# Patient Record
Sex: Female | Born: 1950 | Race: White | Marital: Single | Smoking: Current every day smoker
Health system: Southern US, Community
[De-identification: ages and names within clinical notes are randomized; demographics above are authoritative.]

## PROBLEM LIST (undated history)

## (undated) DIAGNOSIS — J449 Chronic obstructive pulmonary disease, unspecified: Secondary | ICD-10-CM

## (undated) DIAGNOSIS — K648 Other hemorrhoids: Secondary | ICD-10-CM

## (undated) DIAGNOSIS — E079 Disorder of thyroid, unspecified: Secondary | ICD-10-CM

## (undated) DIAGNOSIS — K644 Residual hemorrhoidal skin tags: Secondary | ICD-10-CM

## (undated) DIAGNOSIS — L439 Lichen planus, unspecified: Secondary | ICD-10-CM

## (undated) DIAGNOSIS — R0989 Other specified symptoms and signs involving the circulatory and respiratory systems: Secondary | ICD-10-CM

## (undated) DIAGNOSIS — E039 Hypothyroidism, unspecified: Secondary | ICD-10-CM

## (undated) DIAGNOSIS — E785 Hyperlipidemia, unspecified: Secondary | ICD-10-CM

## (undated) DIAGNOSIS — K449 Diaphragmatic hernia without obstruction or gangrene: Secondary | ICD-10-CM

## (undated) DIAGNOSIS — J439 Emphysema, unspecified: Secondary | ICD-10-CM

## (undated) DIAGNOSIS — H35349 Macular cyst, hole, or pseudohole, unspecified eye: Secondary | ICD-10-CM

## (undated) DIAGNOSIS — T7840XA Allergy, unspecified, initial encounter: Secondary | ICD-10-CM

## (undated) DIAGNOSIS — K219 Gastro-esophageal reflux disease without esophagitis: Secondary | ICD-10-CM

## (undated) DIAGNOSIS — E559 Vitamin D deficiency, unspecified: Secondary | ICD-10-CM

## (undated) DIAGNOSIS — N159 Renal tubulo-interstitial disease, unspecified: Secondary | ICD-10-CM

## (undated) DIAGNOSIS — M1990 Unspecified osteoarthritis, unspecified site: Secondary | ICD-10-CM

## (undated) DIAGNOSIS — K589 Irritable bowel syndrome without diarrhea: Secondary | ICD-10-CM

## (undated) DIAGNOSIS — C159 Malignant neoplasm of esophagus, unspecified: Secondary | ICD-10-CM

## (undated) DIAGNOSIS — N200 Calculus of kidney: Secondary | ICD-10-CM

## (undated) DIAGNOSIS — Z923 Personal history of irradiation: Secondary | ICD-10-CM

## (undated) DIAGNOSIS — K5790 Diverticulosis of intestine, part unspecified, without perforation or abscess without bleeding: Secondary | ICD-10-CM

## (undated) HISTORY — DX: Other hemorrhoids: K64.8

## (undated) HISTORY — DX: Hyperlipidemia, unspecified: E78.5

## (undated) HISTORY — DX: Residual hemorrhoidal skin tags: K64.4

## (undated) HISTORY — DX: Gastro-esophageal reflux disease without esophagitis: K21.9

## (undated) HISTORY — DX: Lichen planus, unspecified: L43.9

## (undated) HISTORY — DX: Chronic obstructive pulmonary disease, unspecified: J44.9

## (undated) HISTORY — DX: Malignant neoplasm of esophagus, unspecified: C15.9

## (undated) HISTORY — DX: Diaphragmatic hernia without obstruction or gangrene: K44.9

## (undated) HISTORY — DX: Disorder of thyroid, unspecified: E07.9

## (undated) HISTORY — DX: Calculus of kidney: N20.0

## (undated) HISTORY — DX: Other specified symptoms and signs involving the circulatory and respiratory systems: R09.89

## (undated) HISTORY — DX: Hypothyroidism, unspecified: E03.9

## (undated) HISTORY — DX: Unspecified osteoarthritis, unspecified site: M19.90

## (undated) HISTORY — DX: Diverticulosis of intestine, part unspecified, without perforation or abscess without bleeding: K57.90

## (undated) HISTORY — DX: Allergy, unspecified, initial encounter: T78.40XA

## (undated) HISTORY — DX: Renal tubulo-interstitial disease, unspecified: N15.9

## (undated) HISTORY — DX: Emphysema, unspecified: J43.9

## (undated) HISTORY — DX: Irritable bowel syndrome, unspecified: K58.9

## (undated) HISTORY — DX: Macular cyst, hole, or pseudohole, unspecified eye: H35.349

## (undated) HISTORY — PX: COLONOSCOPY: SHX174

## (undated) HISTORY — DX: Vitamin D deficiency, unspecified: E55.9

---

## 1980-02-05 HISTORY — PX: DILATION AND CURETTAGE OF UTERUS: SHX78

## 2015-02-05 HISTORY — PX: COLONOSCOPY: SHX174

## 2015-06-29 ENCOUNTER — Telehealth: Payer: Self-pay | Admitting: Internal Medicine

## 2015-06-29 NOTE — Telephone Encounter (Signed)
Records approved by Dr. Hilarie Fredrickson - ok to schedule colonoscopy.

## 2015-06-30 NOTE — Telephone Encounter (Signed)
Patient is requesting August. We will call her when August schedule is available.

## 2015-07-07 ENCOUNTER — Encounter: Payer: Self-pay | Admitting: Internal Medicine

## 2015-08-11 ENCOUNTER — Other Ambulatory Visit: Payer: Self-pay | Admitting: Internal Medicine

## 2015-08-11 DIAGNOSIS — M5412 Radiculopathy, cervical region: Secondary | ICD-10-CM

## 2015-08-11 DIAGNOSIS — M5416 Radiculopathy, lumbar region: Secondary | ICD-10-CM

## 2015-08-19 ENCOUNTER — Other Ambulatory Visit: Payer: Self-pay

## 2015-08-23 ENCOUNTER — Ambulatory Visit
Admission: RE | Admit: 2015-08-23 | Discharge: 2015-08-23 | Disposition: A | Discharge status: Home / Self Care | Payer: Medicare Other | Source: Ambulatory Visit | Attending: Internal Medicine | Admitting: Internal Medicine

## 2015-08-23 DIAGNOSIS — M5412 Radiculopathy, cervical region: Secondary | ICD-10-CM

## 2015-08-23 DIAGNOSIS — M5416 Radiculopathy, lumbar region: Secondary | ICD-10-CM

## 2015-09-07 ENCOUNTER — Encounter (INDEPENDENT_AMBULATORY_CARE_PROVIDER_SITE_OTHER): Payer: Self-pay

## 2015-09-07 ENCOUNTER — Ambulatory Visit (AMBULATORY_SURGERY_CENTER): Payer: Self-pay | Admitting: *Deleted

## 2015-09-07 VITALS — Ht 66.0 in | Wt 146.0 lb

## 2015-09-07 DIAGNOSIS — R194 Change in bowel habit: Secondary | ICD-10-CM

## 2015-09-07 MED ORDER — NA SULFATE-K SULFATE-MG SULF 17.5-3.13-1.6 GM/177ML PO SOLN: 1.0000 | Freq: Once | ORAL | 0 refills | Status: AC

## 2015-09-07 NOTE — Progress Notes (Signed)
No allergies to eggs or soy. No problems with anesthesia.  Pt given Emmi instructions for colonoscopy  No oxygen use  No diet drug use  

## 2015-09-21 ENCOUNTER — Encounter: Payer: Self-pay | Admitting: Internal Medicine

## 2015-09-21 ENCOUNTER — Ambulatory Visit (AMBULATORY_SURGERY_CENTER): Payer: Medicare Other | Admitting: Internal Medicine

## 2015-09-21 VITALS — BP 152/74 | HR 70 | Temp 98.2°F | Resp 23 | Ht 66.0 in | Wt 146.0 lb

## 2015-09-21 DIAGNOSIS — Z8719 Personal history of other diseases of the digestive system: Secondary | ICD-10-CM

## 2015-09-21 DIAGNOSIS — R1030 Lower abdominal pain, unspecified: Secondary | ICD-10-CM | POA: Diagnosis not present

## 2015-09-21 DIAGNOSIS — R194 Change in bowel habit: Secondary | ICD-10-CM | POA: Diagnosis not present

## 2015-09-21 MED ORDER — SODIUM CHLORIDE 0.9 % IV SOLN: 500.0000 mL | INTRAVENOUS | Status: DC

## 2015-09-21 NOTE — Progress Notes (Signed)
Report to PACU, RN, vss, BBS= Clear.  

## 2015-09-21 NOTE — Patient Instructions (Signed)
YOU HAD AN ENDOSCOPIC PROCEDURE TODAY AT [facility]:   Refer to the procedure report that was given to you for any specific questions about what was found during the examination.  If the procedure report does not answer your questions, please call your gastroenterologist to clarify.  If you requested that your care partner not be given the details of your procedure findings, then the procedure report has been included in a sealed envelope for you to review at your convenience later.  YOU SHOULD EXPECT: Some feelings of bloating in the abdomen. Passage of more gas than usual.  Walking can help get rid of the air that was put into your GI tract during the procedure and reduce the bloating. If you had a lower endoscopy (such as a colonoscopy or flexible sigmoidoscopy) you may notice spotting of blood in your stool or on the toilet paper. If you underwent a bowel prep for your procedure, you may not have a normal bowel movement for a few days.  Please Note:  You might notice some irritation and congestion in your nose or some drainage.  This is from the oxygen used during your procedure.  There is no need for concern and it should clear up in a day or so.  SYMPTOMS TO REPORT IMMEDIATELY:   Following lower endoscopy (colonoscopy or flexible sigmoidoscopy):  Excessive amounts of blood in the stool  Significant tenderness or worsening of abdominal pains  Swelling of the abdomen that is new, acute  Fever of 100F or higher  For urgent or emergent issues, a gastroenterologist can be reached at any hour by calling [phone].   DIET:  We do recommend a small meal at first, but then you may proceed to your regular diet.  Drink plenty of fluids but you should avoid alcoholic beverages for 24 hours.  ACTIVITY:  You should plan to take it easy for the rest of today and you should NOT DRIVE or use heavy machinery until tomorrow (because of the sedation medicines used during the test).     FOLLOW UP: Our staff will call the number listed on your records the next business day following your procedure to check on you and address any questions or concerns that you may have regarding the information given to you following your procedure. If we do not reach you, we will leave a message.  However, if you are feeling well and you are not experiencing any problems, there is no need to return our call.  We will assume that you have returned to your regular daily activities without incident.  If any biopsies were taken you will be contacted by phone or by letter within the next 1-3 weeks.  Please call us at [phone] if you have not heard about the biopsies in 3 weeks.    SIGNATURES/CONFIDENTIALITY: You and/or your care partner have signed paperwork which will be entered into your electronic medical record.  These signatures attest to the fact that that the information above on your After Visit Summary has been reviewed and is understood.  Full responsibility of the confidentiality of this discharge information lies with you and/or your care-partner.  Thank you for letting us take care of your healthcare needs.

## 2015-09-21 NOTE — Op Note (Signed)
[facility] Patient Name: Alison Garner Procedure Date: 09/21/2015 11:10 AM MRN: [id number] Endoscopist: Jerene Bears , MD Age: 65 Referring MD:  Date of Birth: Mar 07, 1950 Gender: Female Account #: [id number] Procedure:                Colonoscopy Indications:              Irritable bowel syndrome, Lower abdominal pain,                            Change in bowel habits, tenesmus Medicines:                Monitored Anesthesia Care Procedure:                Pre-Anesthesia Assessment:                           - Prior to the procedure, a History and Physical                            was performed, and patient medications and                            allergies were reviewed. The patient's tolerance of                            previous anesthesia was also reviewed. The risks                            and benefits of the procedure and the sedation                            options and risks were discussed with the patient.                            All questions were answered, and informed consent                            was obtained. Prior Anticoagulants: The patient has                            taken no previous anticoagulant or antiplatelet                            agents. ASA Grade Assessment: II - A patient with                            mild systemic disease. After reviewing the risks                            and benefits, the patient was deemed in                            satisfactory condition to undergo the procedure.  After obtaining informed consent, the colonoscope                            was passed under direct vision. Throughout the                            procedure, the patient's blood pressure, pulse, and                            oxygen saturations were monitored continuously. The                            Model PCF-H190L [id number]) scope was introduced                            through the anus and advanced to  the the cecum,                            identified by appendiceal orifice and ileocecal                            valve. The colonoscopy was performed without                            difficulty. The patient tolerated the procedure                            well. The quality of the bowel preparation was                            good. The ileocecal valve, appendiceal orifice, and                            rectum were photographed. Scope In: 11:28:47 AM Scope Out: [id number] AM Scope Withdrawal Time: 0 hours 12 minutes 14 seconds  Total Procedure Duration: 0 hours 17 minutes 24 seconds  Findings:                 The digital rectal exam findings include                            non-thrombosed external hemorrhoids and thrombosed                            internal hemorrhoids.                           A few small-mouthed diverticula were found in the                            sigmoid colon.                           The exam was otherwise normal throughout the  examined colon.                           External and internal hemorrhoids were found during                            retroflexion and during digital exam. The                            hemorrhoids were large. Complications:            No immediate complications. Estimated Blood Loss:     Estimated blood loss: none. Impression:               - Diverticulosis in the sigmoid colon.                           - Large external and internal hemorrhoids.                           - No polyps or evidence of colitis.                           - No specimens collected. Recommendation:           - Patient has a contact number available for                            emergencies. The signs and symptoms of potential                            delayed complications were discussed with the                            patient. Return to normal activities tomorrow.                            Written discharge  instructions were provided to the                            patient.                           - Resume previous diet.                           - Continue present medications.                           - Repeat colonoscopy in 10 years for screening                            purposes.                           - Refer to a surgeon at appointment to be scheduled  for consideration of hemorrhoidectomy. Jerene Bears, MD 09/21/2015 11:52:10 AM This report has been signed electronically.

## 2015-09-22 ENCOUNTER — Telehealth: Payer: Self-pay

## 2015-09-22 NOTE — Telephone Encounter (Signed)
  Follow up Call-  Call back number 09/21/2015  Post procedure Call Back phone  # [phone]  Permission to leave phone message Yes     Patient questions:  Do you have a fever, pain , or abdominal swelling? No. Pain Score  0 *  Have you tolerated food without any problems? Yes.    Have you been able to return to your normal activities? Yes.    Do you have any questions about your discharge instructions: Diet   No. Medications  No. Follow up visit  No.  Do you have questions or concerns about your Care? No.  Actions:

## 2015-09-22 NOTE — Telephone Encounter (Signed)
  Follow up Call-  Call back number 09/21/2015  Post procedure Call Back phone  # [phone]  Permission to leave phone message Yes     Patient questions:  Do you have a fever, pain , or abdominal swelling? No. Pain Score  0 *  Have you tolerated food without any problems? Yes.    Have you been able to return to your normal activities? Yes.    Do you have any questions about your discharge instructions: Diet   No. Medications  No. Follow up visit  No.  Do you have questions or concerns about your Care? No.   Actions: * If pain score is 4 or above: No action needed, pain <4.

## 2017-08-28 ENCOUNTER — Encounter (INDEPENDENT_AMBULATORY_CARE_PROVIDER_SITE_OTHER): Payer: Self-pay | Admitting: Orthopaedic Surgery

## 2017-08-28 ENCOUNTER — Ambulatory Visit (INDEPENDENT_AMBULATORY_CARE_PROVIDER_SITE_OTHER): Payer: Medicare Other

## 2017-08-28 ENCOUNTER — Ambulatory Visit (INDEPENDENT_AMBULATORY_CARE_PROVIDER_SITE_OTHER): Payer: Medicare Other | Admitting: Orthopaedic Surgery

## 2017-08-28 VITALS — BP 119/64 | HR 91 | Ht 65.0 in | Wt 144.0 lb

## 2017-08-28 DIAGNOSIS — M25521 Pain in right elbow: Secondary | ICD-10-CM

## 2017-08-28 NOTE — Progress Notes (Signed)
Office Visit Note   Patient: Alison Garner           Date of Birth: 07-Oct-1950           MRN: [id number] Visit Date: 08/28/2017              Requested by: [name], [address] [address] PCP: [name], MD   Assessment & Plan: Visit Diagnoses:  1. Pain in right elbow     Plan: Patient's exam is consistent with distal biceps tendinopathy.  Discussed activity modification to avoid resistive biceps activities for period of time.  2 months.  We discussed if she has persistent symptoms we can obtain an MRI scan.  She should get resolution of her symptoms with activity modification and rest. Follow-Up Instructions: No follow-ups on file.   Orders:  Orders Placed This Encounter  Procedures  . XR Elbow 2 Views Right   No orders of the defined types were placed in this encounter.     Procedures: No procedures performed   Clinical Data: No additional findings.   Subjective: Chief Complaint  Patient presents with  . Right Elbow - Pain    HPI 67 year old female here for evaluation of right elbow for 1 month.  She had done a lot of gardening your work pulling weeds is right-hand dominant started having right antecubital pain was reproduced with resisted elbow flexion.  Is holding someone's child last month and had significant increase in pain in the area.  The biceps tendon in the antecubital region.  Past history of injury..  Review of Systems previous surgeries include colonoscopy to.  Reflux bronchitis cataracts tiny goiter and IBS   Objective: Vital Signs: BP 119/64   Pulse 91   Ht 5\' 5"  (1.651 m)   Wt 144 lb (65.3 kg)   BMI 23.96 kg/m   Physical Exam  Constitutional: She is oriented to person, place, and time. She appears well-developed.  HENT:  Head: Normocephalic.  Right Ear: External ear normal.  Left Ear: External ear normal.  Eyes: Pupils are equal, round, and reactive to light.  Neck: No tracheal deviation present.  No thyromegaly present.  Cardiovascular: Normal rate.  Pulmonary/Chest: Effort normal.  Abdominal: Soft.  Neurological: She is alert and oriented to person, place, and time.  Skin: Skin is warm and dry.  Psychiatric: She has a normal mood and affect. Her behavior is normal.    Ortho Exam no pain with resisted forearm pronation supination.  Tenderness of the distal biceps tendon some mild swelling around the biceps tendon.  Pain with resisted biceps function directly over the bicipital tuberosity and distal biceps insertion site.  Medial lateral epicondyles are normal radial heads normal no elbow effusion median nerve in the forearm the wrist is normal no intrinsic atrophy.  Specialty Comments:  No specialty comments available.  Imaging: No results found.   PMFS History: There are no active problems to display for this patient.  Past Medical History:  Diagnosis Date  . Arthritis   . Hyperlipidemia   . Thyroid disease    thyroid nodule    Family History  Problem Relation Age of Onset  . Colon cancer Neg Hx   . Esophageal cancer Neg Hx   . Stomach cancer Neg Hx   . Rectal cancer Neg Hx     Past Surgical History:  Procedure Laterality Date  . COLONOSCOPY    . [place]   Social History  Occupational History  . Not on file  Tobacco Use  . Smoking status: Current Every Day Smoker    Packs/day: 1.00    Years: 50.00    Pack years: 50.00    Types: Cigarettes  . Smokeless tobacco: Never Used  Substance and Sexual Activity  . Alcohol use: Yes    Comment: rare  . Drug use: No  . Sexual activity: Not on file

## 2017-11-06 ENCOUNTER — Ambulatory Visit (INDEPENDENT_AMBULATORY_CARE_PROVIDER_SITE_OTHER): Payer: Medicare Other | Admitting: Orthopaedic Surgery

## 2020-09-15 ENCOUNTER — Encounter: Payer: Self-pay | Admitting: *Deleted

## 2020-11-13 ENCOUNTER — Ambulatory Visit (INDEPENDENT_AMBULATORY_CARE_PROVIDER_SITE_OTHER): Payer: Medicare Other | Admitting: Internal Medicine

## 2020-11-13 ENCOUNTER — Encounter: Payer: Self-pay | Admitting: Internal Medicine

## 2020-11-13 VITALS — BP 130/70 | HR 82 | Ht 65.0 in | Wt 135.4 lb

## 2020-11-13 DIAGNOSIS — R1010 Upper abdominal pain, unspecified: Secondary | ICD-10-CM | POA: Diagnosis not present

## 2020-11-13 DIAGNOSIS — K449 Diaphragmatic hernia without obstruction or gangrene: Secondary | ICD-10-CM | POA: Diagnosis not present

## 2020-11-13 DIAGNOSIS — R1310 Dysphagia, unspecified: Secondary | ICD-10-CM | POA: Diagnosis not present

## 2020-11-13 NOTE — Progress Notes (Signed)
Patient ID: Alison Garner, female   DOB: [birth year], 70 y.o.   MRN: [id number] HPI: Alison Garner is a 70 year old female with PMH of colonic diverticulosis, hemorrhoids, hyperlipidemia, hypothyroidism, kidney stones who is seen in consult at the request of Dr. Jenean Lindau to evaluate dysphagia and upper abdominal pain.  She is here alone today.  She is known to me from colonoscopy performed on 09/21/2015 for IBS type symptoms with lower abdominal pain, change in bowel habits and tenesmus.  This was normal with the exception of a few sigmoid diverticula and hemorrhoids.  She reports that around May or June she developed dysphagia symptom.  She reports food will stick in the mid chest and it is painful.  This is worse with cold drinks and liquids.  She also avoids tomatoes or if she eats spicy or acidic food she does so with milk.  Room temperature and hot liquids are easier.  Some solid food dysphagia with dry food like biscuit.  Occurs 3-4 times per week though is intermittent.  When it happens either she can regurgitate the food back up or she will stand up and the food will transit into the stomach.  She is not having true heartburn.  At primary care recommendation she tried famotidine but only for about a week with no change in symptoms.  Separate from this she has nocturnal episodes of upper abdominal pain under her breast which feels like a belt is being squeezed.  Does not happen during the day or with exertion.  Has only occurred at night waking her from sleep.  Lasts 5 to 8 minutes and resolves.  She is able to swallow without incident during these events.  Past Medical History:  Diagnosis Date   Arthritis    Carotid bruit    Diverticulosis    External hemorrhoids    Hiatal hernia    Hyperlipidemia    Hypothyroidism    Internal hemorrhoids    Irritable bowel syndrome    Kidney stones    Lichen planus    Macular cyst, hole, or pseudohole, unspecified eye    Thyroid disease    thyroid  nodule   Vitamin D deficiency     Past Surgical History:  Procedure Laterality Date   COLONOSCOPY     DILATION AND CURETTAGE OF UTERUS  1982    Outpatient Medications Prior to Visit  Medication Sig Dispense Refill   b complex vitamins tablet Take 1 tablet by mouth 2 (two) times daily.     Cholecalciferol 50 MCG (2000 UT) CAPS Take 4,000 Units by mouth daily.     ezetimibe-simvastatin (VYTORIN) 10-20 MG tablet Take 1 tablet by mouth every other day.     imipramine (TOFRANIL) 25 MG tablet Take 25 mg by mouth daily. Takes 2 tablets at bedtime     levothyroxine (SYNTHROID, LEVOTHROID) 25 MCG tablet      Levothyroxine Sodium 25 MCG CAPS Take by mouth daily before breakfast.     Multiple Vitamins-Minerals (PRESERVISION/LUTEIN PO) Take by mouth daily.     clobetasol ointment (TEMOVATE) 8.14 % Apply 1 application topically 2 (two) times daily.     clotrimazole-betamethasone (LOTRISONE) cream Apply 1 application topically 2 (two) times daily.     hydrocortisone 1 % ointment Apply 1 application topically 2 (two) times daily.     triamcinolone lotion (KENALOG) 0.1 % Apply 1 application topically 3 (three) times daily.     Facility-Administered Medications Prior to Visit  Medication Dose Route Frequency Provider Last  Rate Last Admin   0.9 %  sodium chloride infusion  500 mL Intravenous Continuous Francelia Mclaren, Lajuan Lines, MD        Allergies  Allergen Reactions   Bactrim [Sulfamethoxazole-Trimethoprim] Rash    Family History  Problem Relation Age of Onset   Heart disease Mother    Hypertension Mother    Heart disease Father    Heart failure Father    Thyroid disease Brother    Heart disease Maternal Aunt    Other Maternal Aunt        MDS   Heart disease Maternal Uncle    Heart failure Maternal Uncle    Myasthenia gravis Paternal Aunt    Heart disease Paternal Uncle    Heart failure Paternal Uncle    Colon cancer Neg Hx    Esophageal cancer Neg Hx    Stomach cancer Neg Hx    Rectal cancer  Neg Hx     Social History   Tobacco Use   Smoking status: Every Day    Packs/day: 1.00    Years: 50.00    Pack years: 50.00    Types: Cigarettes   Smokeless tobacco: Current  Substance Use Topics   Alcohol use: Yes    Comment: rare   Drug use: No    ROS: As per history of present illness, otherwise negative  BP 130/70   Pulse 82   Ht 5\' 5"  (1.651 m)   Wt 135 lb 6 oz (61.4 kg)   BMI 22.53 kg/m  Gen: awake, alert, NAD HEENT: anicteric CV: RRR, no mrg Pulm: CTA b/l Abd: soft, NT/ND, +BS throughout Ext: no c/c/e Neuro: nonfocal  RELEVANT LABS AND IMAGING: 2 view chest x-ray January 2020 2 aortic atherosclerosis, cervical spondylosis, anterior eventration right hemidiaphragm, clear lungs.  No cardiomegaly.  No pulmonary congestion.  No pleural effusion.  No pneumothorax.  CT scan abdomen pelvis with and without contrast dated 03/30/2020.  Performed for microscopic hematuria. Findings: Punctate nonobstructive left renal stones, no hydronephrosis.  No suspicious renal masses.  Sigmoid colonic diverticula without acute diverticulitis.  Aortic atherosclerosis.  In the body of the report it is noted she has a small hiatal hernia but otherwise unremarkable decompressed appearance of the stomach.  Normal positioning of the duodenum.  Appendix and terminal ileum appeared unremarkable.  ASSESSMENT/PLAN: 70 year old female with PMH of colonic diverticulosis, hemorrhoids, hyperlipidemia, hypothyroidism, kidney stones who is seen in consult at the request of Dr. Jenean Lindau to evaluate dysphagia and upper abdominal pain.   Dysphagia/odynophagia/upper abdominal pain --symptoms sound esophageal and raise the question of esophagitis, esophageal stricture, and also esophageal spasm.  She tried H2 blocker but for a limited amount of time.  We discussed this at length and I recommended the following: --Pantoprazole 40 mg daily --Barium esophagram with tablet --EGD after barium esophagram; we  reviewed the risk, benefits and alternatives and she is agreeable and wishes to proceed --If upper abdominal pain continues consider ultrasound gallbladder  2.  CRC screening --no polyps a colonoscopy in 2017, repeat would be considered in 2027      FI:[facility], [place], [place] [address]

## 2020-11-13 NOTE — Patient Instructions (Signed)
We have sent the following medications to your pharmacy for you to pick up at your convenience: Pantoprazole 40 mg once daily  You have been scheduled for an endoscopy. Please follow written instructions given to you at your visit today. If you use inhalers (even only as needed), please bring them with you on the day of your procedure.  You have been scheduled for a Barium Esophogram at [facility] (1st floor of the hospital) on Tuesday 11/21/20 at 11:00 am. Please arrive 15 minutes prior to your appointment for registration. Make certain not to have anything to eat or drink 3 hours prior to your test. If you need to reschedule for any reason, please contact radiology at [phone] to do so. ________________________________________________________ A barium swallow is an examination that concentrates on views of the esophagus. This tends to be a double contrast exam (barium and two liquids which, when combined, create a gas to distend the wall of the oesophagus) or single contrast (non-ionic iodine based). The study is usually tailored to your symptoms so a good history is essential. Attention is paid during the study to the form, structure and configuration of the esophagus, looking for functional disorders (such as aspiration, dysphagia, achalasia, motility and reflux) EXAMINATION You may be asked to change into a gown, depending on the type of swallow being performed. A radiologist and radiographer will perform the procedure. The radiologist will advise you of the type of contrast selected for your procedure and direct you during the exam. You will be asked to stand, sit or lie in several different positions and to hold a small amount of fluid in your mouth before being asked to swallow while the imaging is performed .In some instances you may be asked to swallow barium coated marshmallows to assess the motility of a solid food bolus. The exam can be recorded as a digital or video  fluoroscopy procedure. POST PROCEDURE It will take 1-2 days for the barium to pass through your system. To facilitate this, it is important, unless otherwise directed, to increase your fluids for the next 24-48hrs and to resume your normal diet.  This test typically takes about 30 minutes to perform. ________________________________________________________If you are age 81 or older, your body mass index should be between 23-30. Your Body mass index is 22.53 kg/m. If this is out of the aforementioned range listed, please consider follow up with your Primary Care Provider.  If you are age 71 or younger, your body mass index should be between 19-25. Your Body mass index is 22.53 kg/m. If this is out of the aformentioned range listed, please consider follow up with your Primary Care Provider.   _______________________________________________________ The [facility] providers would like to encourage you to use [facility] to communicate with providers for non-urgent requests or questions.  Due to long hold times on the telephone, sending your provider a message by [facility] may be a faster and more efficient way to get a response.  Please allow 48 business hours for a response.  Please remember that this is for non-urgent requests.  _______________________________________________________ Due to recent changes in healthcare laws, you may see the results of your imaging and laboratory studies on MyChart before your provider has had a chance to review them.  We understand that in some cases there may be results that are confusing or concerning to you. Not all laboratory results come back in the same time frame and the provider may be waiting for multiple results in order to interpret others.  Please give [place] 48 hours in order for your provider to thoroughly review all the results before contacting the office for clarification of your results.

## 2020-11-15 MED ORDER — PANTOPRAZOLE SODIUM 40 MG PO TBEC
40.0000 mg | DELAYED_RELEASE_TABLET | Freq: Every day | ORAL | 1 refills | Status: DC
Start: 2020-11-15 — End: 2020-12-13

## 2020-11-21 ENCOUNTER — Ambulatory Visit (HOSPITAL_COMMUNITY)
Admission: RE | Admit: 2020-11-21 | Discharge: 2020-11-21 | Disposition: A | Discharge status: Home / Self Care | Payer: Medicare Other | Source: Ambulatory Visit | Attending: Internal Medicine | Admitting: Internal Medicine

## 2020-11-21 ENCOUNTER — Other Ambulatory Visit: Payer: Self-pay

## 2020-11-21 DIAGNOSIS — K449 Diaphragmatic hernia without obstruction or gangrene: Secondary | ICD-10-CM | POA: Insufficient documentation

## 2020-11-21 DIAGNOSIS — R1310 Dysphagia, unspecified: Secondary | ICD-10-CM | POA: Diagnosis not present

## 2020-12-05 HISTORY — PX: UPPER GASTROINTESTINAL ENDOSCOPY: SHX188

## 2020-12-13 ENCOUNTER — Other Ambulatory Visit: Payer: Self-pay

## 2020-12-13 ENCOUNTER — Telehealth: Payer: Self-pay | Admitting: Gastroenterology

## 2020-12-13 ENCOUNTER — Encounter: Payer: Self-pay | Admitting: Internal Medicine

## 2020-12-13 ENCOUNTER — Ambulatory Visit (AMBULATORY_SURGERY_CENTER): Payer: Medicare Other | Admitting: Internal Medicine

## 2020-12-13 VITALS — BP 168/70 | HR 82 | Temp 97.1°F | Resp 17 | Ht 65.0 in | Wt 135.0 lb

## 2020-12-13 DIAGNOSIS — C159 Malignant neoplasm of esophagus, unspecified: Secondary | ICD-10-CM | POA: Diagnosis not present

## 2020-12-13 DIAGNOSIS — R1319 Other dysphagia: Secondary | ICD-10-CM

## 2020-12-13 DIAGNOSIS — K222 Esophageal obstruction: Secondary | ICD-10-CM

## 2020-12-13 DIAGNOSIS — R933 Abnormal findings on diagnostic imaging of other parts of digestive tract: Secondary | ICD-10-CM

## 2020-12-13 DIAGNOSIS — K2289 Other specified disease of esophagus: Secondary | ICD-10-CM

## 2020-12-13 DIAGNOSIS — K2091 Esophagitis, unspecified with bleeding: Secondary | ICD-10-CM

## 2020-12-13 MED ORDER — SUCRALFATE 1 GM/10ML PO SUSP: 1.0000 g | Freq: Four times a day (QID) | ORAL | 1 refills | Status: DC

## 2020-12-13 MED ORDER — PANTOPRAZOLE SODIUM 40 MG PO TBEC: 40.0000 mg | DELAYED_RELEASE_TABLET | Freq: Two times a day (BID) | ORAL | 3 refills | Status: DC

## 2020-12-13 MED ORDER — SODIUM CHLORIDE 0.9 % IV SOLN: 500.0000 mL | Freq: Once | INTRAVENOUS | Status: DC

## 2020-12-13 NOTE — Progress Notes (Signed)
Sedate, gd SR, tolerated procedure well, VSS, report to RN 

## 2020-12-13 NOTE — Progress Notes (Signed)
Patient presents for EGD.  See office note dated 11/13/2020 for details. Dysphagia Recent abnormal barium esophagram after that office visit with moderate to severe esophageal dysmotility suggestion of a distal esophageal stricture impeding barium tablet transit to the stomach  Plan upper endoscopy with probable dilation She remains appropriate for EGD in the [facility] today.  The nature of the procedure, as well as the risks, benefits, and alternatives were carefully and thoroughly reviewed with the patient. Ample time for discussion and questions allowed. The patient understood, was satisfied, and agreed to proceed.

## 2020-12-13 NOTE — Telephone Encounter (Signed)
This patient had an upper endoscopy by you earlier today, and the report indicates a tight distal esophageal stricture precluding scope passage.  CT scan is scheduled for 11/10.  The patient's daughter called because since the procedure earlier today, her mother is unable to consistently swallow liquids.  Even small amounts of swallow liquid seem to come right back up on her.  She was concerned about the possibility of volume depletion.  She has still been urinating and it is reportedly not dark.    I recommended sucking on ice cubes pretty continuously throughout the evening and some Pedialyte pops that they have at home.  If by tomorrow morning she is unable to keep down any liquids, she should contact our office, at which time she might then need ED evaluation for IV fluids. (Especially since she has likely give IV contrast for the CT scan)  - HD

## 2020-12-13 NOTE — Patient Instructions (Addendum)
YOU HAD AN ENDOSCOPIC PROCEDURE TODAY AT [facility]:   Refer to the procedure report that was given to you for any specific questions about what was found during the examination.  If the procedure report does not answer your questions, please call your gastroenterologist to clarify.  If you requested that your care partner not be given the details of your procedure findings, then the procedure report has been included in a sealed envelope for you to review at your convenience later.  YOU SHOULD EXPECT: Some feelings of bloating in the abdomen. Passage of more gas than usual.  Walking can help get rid of the air that was put into your GI tract during the procedure and reduce the bloating. If you had a lower endoscopy (such as a colonoscopy or flexible sigmoidoscopy) you may notice spotting of blood in your stool or on the toilet paper. If you underwent a bowel prep for your procedure, you may not have a normal bowel movement for a few days.  Please Note:  You might notice some irritation and congestion in your nose or some drainage.  This is from the oxygen used during your procedure.  There is no need for concern and it should clear up in a day or so.  SYMPTOMS TO REPORT IMMEDIATELY:  Following upper endoscopy (EGD)  Vomiting of blood or coffee ground material  New chest pain or pain under the shoulder blades  Painful or persistently difficult swallowing  New shortness of breath  Fever of 100F or higher  Black, tarry-looking stools  For urgent or emergent issues, a gastroenterologist can be reached at any hour by calling [phone]. Do not use MyChart messaging for urgent concerns.    DIET:  Full liquid diet until told otherwise by Dr. Hilarie Fredrickson. Drink plenty of fluids but you should avoid alcoholic beverages for 24 hours.  ACTIVITY:  You should plan to take it easy for the rest of today and you should NOT DRIVE or use heavy machinery until tomorrow (because of the sedation  medicines used during the test).    FOLLOW UP: Our staff will call the number listed on your records 48-72 hours following your procedure to check on you and address any questions or concerns that you may have regarding the information given to you following your procedure. If we do not reach you, we will leave a message.  We will attempt to reach you two times.  During this call, we will ask if you have developed any symptoms of COVID 19. If you develop any symptoms (ie: fever, flu-like symptoms, shortness of breath, cough etc.) before then, please call [phone].  If you test positive for Covid 19 in the 2 weeks post procedure, please call and report this information to [place].    If any biopsies were taken you will be contacted by phone or by letter within the next 1-3 weeks.  Please call us at [phone] if you have not heard about the biopsies in 3 weeks.    SIGNATURES/CONFIDENTIALITY: You and/or your care partner have signed paperwork which will be entered into your electronic medical record.  These signatures attest to the fact that that the information above on your After Visit Summary has been reviewed and is understood.  Full responsibility of the confidentiality of this discharge information lies with you and/or your care-partner.

## 2020-12-13 NOTE — Op Note (Signed)
[facility] Patient Name: Alison Garner Procedure Date: 12/13/2020 11:29 AM MRN: [id number] Endoscopist: Jerene Bears , MD Age: 70 Referring MD:  Date of Birth: 06-20-1950 Gender: Female Account #: [id number] Procedure:                Upper GI endoscopy Indications:              Dysphagia, Abnormal cine-esophagram, Chest pain                            (non cardiac) Medicines:                Monitored Anesthesia Care Procedure:                Pre-Anesthesia Assessment:                           - Prior to the procedure, a History and Physical                            was performed, and patient medications and                            allergies were reviewed. The patient's tolerance of                            previous anesthesia was also reviewed. The risks                            and benefits of the procedure and the sedation                            options and risks were discussed with the patient.                            All questions were answered, and informed consent                            was obtained. Prior Anticoagulants: The patient has                            taken no previous anticoagulant or antiplatelet                            agents. ASA Grade Assessment: III - A patient with                            severe systemic disease. After reviewing the risks                            and benefits, the patient was deemed in                            satisfactory condition to undergo the procedure.  After obtaining informed consent, the endoscope was                            passed under direct vision. Throughout the                            procedure, the patient's blood pressure, pulse, and                            oxygen saturations were monitored continuously. The                            Endoscope was introduced through the mouth, and                            advanced to the lower third of esophagus. The  upper                            GI endoscopy was accomplished without difficulty.                            The patient tolerated the procedure well. Scope In: Scope Out: Findings:                 Severe esophagitis with nodularity and stricturing                            was found 36 cm from the incisors. The narrowing                            could not be traversed with the adult upper                            endoscope. The mucosa is ulcerated and I am                            concerned about neoplasm though definitive tumor                            was not confidently identified today. Multiple                            biopsies were taken with a cold forceps for                            histology.                           An examination of the stomach was not performed.                           An examination of the duodenum was not performed. Complications:            No immediate complications. Estimated Blood Loss:     Estimated blood loss was  minimal. Impression:               - Severe acute esophagitis with nodularity and                            severe stenosis. Unable to be traversed today.                            Biopsied to exclude neoplasm. Recommendation:           - Patient has a contact number available for                            emergencies. The signs and symptoms of potential                            delayed complications were discussed with the                            patient. Return to normal activities tomorrow.                            Written discharge instructions were provided to the                            patient.                           - Full liquid diet.                           - Continue present medications. Increase                            pantoprazole to 40 mg twice daily. Liquid                            sucralfate 1g three times daily and at bedtime x 1                            month.                            - Await pathology results.                           - CT scan chest with contrast ASAP.                           - Depending on pathology may need repeat EGD in                            outpatient hospital setting where the pediatric                            endoscope is available. Jerene Bears, MD 12/13/2020 11:57:38 AM This report  has been signed electronically.

## 2020-12-13 NOTE — Progress Notes (Signed)
Called to room to assist during endoscopic procedure.  Patient ID and intended procedure confirmed with present staff. Received instructions for my participation in the procedure from the performing physician.  

## 2020-12-14 ENCOUNTER — Encounter (HOSPITAL_BASED_OUTPATIENT_CLINIC_OR_DEPARTMENT_OTHER): Payer: Self-pay

## 2020-12-14 ENCOUNTER — Ambulatory Visit (HOSPITAL_BASED_OUTPATIENT_CLINIC_OR_DEPARTMENT_OTHER)
Admission: RE | Admit: 2020-12-14 | Discharge: 2020-12-14 | Disposition: A | Discharge status: Home / Self Care | Payer: Medicare Other | Source: Ambulatory Visit | Attending: Internal Medicine | Admitting: Internal Medicine

## 2020-12-14 DIAGNOSIS — K2289 Other specified disease of esophagus: Secondary | ICD-10-CM | POA: Insufficient documentation

## 2020-12-14 LAB — POCT I-STAT CREATININE: Creatinine, Ser: 0.9 mg/dL (ref 0.44–1.00)

## 2020-12-14 MED ORDER — IOHEXOL 300 MG/ML  SOLN
75.0000 mL | Freq: Once | INTRAMUSCULAR | Status: AC | PRN
  Administered 2020-12-14: 75 mL via INTRAVENOUS

## 2020-12-14 NOTE — Telephone Encounter (Signed)
Spoke with pts daughter and she reports her mother was able to sip on water during the night, this morning she is doing much better. She was able to drink her coffee this morning and eat some applesauce. She will have her CT done this afternoon.

## 2020-12-14 NOTE — Telephone Encounter (Signed)
Agree with recommendations  Pt with very tight stricture in distal esophagus with esophagitis and concern for underlying mass lesion It is possible with biopsies, oozing and subsequent clotting the stricture is temporarily tighter/more narrow  She needs to go to the ER if she cannot take liquids by mouth. This would ensure no dehydration and also expedite evaluation.  Please check on her early today. JMP

## 2020-12-15 ENCOUNTER — Telehealth: Payer: Self-pay | Admitting: *Deleted

## 2020-12-15 ENCOUNTER — Other Ambulatory Visit: Payer: Self-pay

## 2020-12-15 DIAGNOSIS — C159 Malignant neoplasm of esophagus, unspecified: Secondary | ICD-10-CM

## 2020-12-15 NOTE — Telephone Encounter (Signed)
  Follow up Call-  Call back number 12/13/2020  Post procedure Call Back phone  # [phone]  Permission to leave phone message Yes  Some recent data might be hidden     Patient questions:  Do you have a fever, pain , or abdominal swelling? No. Pain Score  0 *  Have you tolerated food without any problems? Yes.    Have you been able to return to your normal activities? Yes.    Do you have any questions about your discharge instructions: Diet   No. Medications  No. Follow up visit  No.  Do you have questions or concerns about your Care? No.  Actions: * If pain score is 4 or above: No action needed, pain <4.  Have you developed a fever since your procedure? no  2.   Have you had an respiratory symptoms (SOB or cough) since your procedure? no  3.   Have you tested positive for COVID 19 since your procedure no  4.   Have you had any family members/close contacts diagnosed with the COVID 19 since your procedure?  no   If yes to any of these questions please route to Joylene John, RN and Joella Prince, RN

## 2020-12-18 ENCOUNTER — Other Ambulatory Visit: Payer: Self-pay

## 2020-12-18 ENCOUNTER — Other Ambulatory Visit: Payer: Self-pay | Admitting: *Deleted

## 2020-12-18 ENCOUNTER — Inpatient Hospital Stay (HOSPITAL_BASED_OUTPATIENT_CLINIC_OR_DEPARTMENT_OTHER): Payer: Medicare Other | Admitting: Oncology

## 2020-12-18 ENCOUNTER — Encounter: Payer: Self-pay | Admitting: *Deleted

## 2020-12-18 VITALS — BP 148/68 | HR 98 | Temp 98.1°F | Resp 18 | Ht 65.0 in | Wt 133.2 lb

## 2020-12-18 DIAGNOSIS — K649 Unspecified hemorrhoids: Secondary | ICD-10-CM | POA: Insufficient documentation

## 2020-12-18 DIAGNOSIS — E785 Hyperlipidemia, unspecified: Secondary | ICD-10-CM | POA: Insufficient documentation

## 2020-12-18 DIAGNOSIS — K589 Irritable bowel syndrome without diarrhea: Secondary | ICD-10-CM | POA: Insufficient documentation

## 2020-12-18 DIAGNOSIS — J449 Chronic obstructive pulmonary disease, unspecified: Secondary | ICD-10-CM | POA: Insufficient documentation

## 2020-12-18 DIAGNOSIS — R1310 Dysphagia, unspecified: Secondary | ICD-10-CM | POA: Insufficient documentation

## 2020-12-18 DIAGNOSIS — C155 Malignant neoplasm of lower third of esophagus: Secondary | ICD-10-CM | POA: Insufficient documentation

## 2020-12-18 DIAGNOSIS — Z720 Tobacco use: Secondary | ICD-10-CM | POA: Insufficient documentation

## 2020-12-18 DIAGNOSIS — E039 Hypothyroidism, unspecified: Secondary | ICD-10-CM | POA: Insufficient documentation

## 2020-12-18 NOTE — Progress Notes (Signed)
PATIENT NAVIGATOR PROGRESS NOTE  Name: Alison Garner Date: 12/18/2020 MRN: [id number]  DOB: [birth year]   Reason for visit:  Initial visit with Dr Benay Spice  Comments:  Met with [name] and her daughter during visit with Dr Benay Spice. Molecular testing of biopsy for PDL 1 score, Her2 and IHC requested. PET scan scheduled for 11/22 at 11 am, consult for Rad/Onc initiated as well as nutrition consult.    No other navigation needs identified. Gave patient and her daughter contact information to call with any questions    Time spent counseling/coordinating care: 45-60 minutes

## 2020-12-18 NOTE — Progress Notes (Signed)
[facility] New Patient Consult   Requesting MD: [name], [address] [place] [address]   Lashia Niese 70 y.o.  Sep 26, 1950    Reason for Consult: Esophagus cancer   HPI: Ms. Zaro reports intermittent episodes of subxiphoid and back pain waking her up at night beginning in January of this year.  The episodes have been more frequent over recent months.  She developed solid dysphagia beginning in June.  The dysphagia progressed and she was referred to Dr. Hilarie Fredrickson.  An esophagram on 11/21/2020 revealed narrowing of the distal esophagus just above the GE junction.  A 13 mm barium tablet was stuck in the distal esophagus just above the GE junction. She was taken to an upper endoscopy on 12/13/2020.  Severe esophagitis with nodularity and stricturing was found at 36 cm.  The narrowing could not be traversed.  The mucosa was ulcerated.  Multiple biopsies were obtained.  She was changed to a liquid/pured diet.  Protonix was increased to twice daily and she started Carafate. 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  A staging chest CT on 12/14/2020 revealed a 6 mm AP window node and an 8 mm right hilar node.  No pathologic adenopathy.  High density at the lumen of the distal esophagus favoring a mass along the mucosa.  No adjacent adenopathy.  Centrilobular emphysema.  The upper abdomen is unremarkable.  Past Medical History:  Diagnosis Date   Arthritis    Carotid bruit    Diverticulosis    External hemorrhoids    Hiatal hernia    Hyperlipidemia    Hypothyroidism    Internal hemorrhoids    Irritable bowel syndrome    Kidney stones    Lichen planus    Macular cyst, hole, or pseudohole, unspecified eye    Thyroid disease    thyroid nodule   Vitamin D deficiency     .  G1, P1  Past Surgical History:  Procedure Laterality Date   COLONOSCOPY     DILATION AND CURETTAGE OF UTERUS  1982    Medications:  Reviewed  Allergies:  Allergies  Allergen Reactions   Bactrim [Sulfamethoxazole-Trimethoprim] Rash    Family history: Her paternal grandfather had rectal cancer  Social History:   She lives alone in [place].  Her daughter lives in [place].  She is retired from the [employer].  She smokes 1 pack of cigarettes per day for the past 50 years.  Rare alcohol use.  No risk factor for HIV or hepatitis.  No transfusion history.  ROS:   Positives include: Subxiphoid and back pain at night since January 2022-better after changing to a liquid diet, 12 pound weight loss  A complete ROS was otherwise negative.  Physical Exam:  Blood pressure (!) 148/68, pulse 98, temperature 98.1 F (36.7 C), temperature source Oral, resp. rate 18, height _0  (1.651 m), weight 133 lb 3.2 oz (60.4 kg), SpO2 99 %.  HEENT: Oropharynx without visible mass, neck without mass Lungs: Distant breath sounds, clear bilaterally Cardiac: Regular rate and rhythm Abdomen: Nontender, no mass, no hepatosplenomegaly  Vascular: No leg edema Lymph nodes: No cervical, supraclavicular, axillary, or inguinal nodes Neurologic: Alert and oriented, the motor exam appears intact in the upper and lower extremities bilaterally Skin: No rash Musculoskeletal: No spine tenderness     Imaging: As per HPI, CT images from 12/14/2020 reviewed with Ms. Saline and her daughter    Assessment/Plan:   Distal esophagus cancer  Esophagram 11/21/2020-moderate to severe esophageal dysmotility, narrowing of the distal esophagus just above the GE junction to 4 mm, 13 mm barium tablet stuck in the distal esophagus Upper endoscopy 12/13/2020-severe esophagitis with nodularity and stricturing at 61 cm-adenocarcinoma CT chest 12/14/2020-accentuated density at the distal esophageal lumen above a small type I hiatal hernia concerning for malignancy, nonpathologically enlarged AP window and right hilar nodes Solid  dysphagia secondary #1 COPD on chest CT 12/14/2020 Hemorrhoids Hyperlipidemia Irritable bowel syndrome Hypothyroidism Tobacco use   Disposition:   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  She appears to have disease localized to the esophagus based on the staging evaluation today.  I discussed treatment options with her.  We discussed the multidisciplinary approach used to treat esophagus cancer.  She appears to be a candidate for a course of chemotherapy/radiation.  We will make a surgical referral if the staging PET scan does not show distant disease.  She may not be a surgical candidate based on her history of tobacco use and probable COPD.  I will present her case at the GI tumor conference on 12/20/2020.  She will be referred to Dr. Lisbeth Renshaw.  She will return for an office visit and further discussion on 12/27/2020.  She will see the nutritionist on 12/27/2020.  We will request HER-2, a PD-L1 score, and mismatch repair protein testing on the esophagus biopsy.  Betsy Coder, MD  12/18/2020, 5:19 PM

## 2020-12-20 ENCOUNTER — Other Ambulatory Visit: Payer: Self-pay | Admitting: *Deleted

## 2020-12-20 ENCOUNTER — Other Ambulatory Visit: Payer: Self-pay

## 2020-12-20 DIAGNOSIS — C155 Malignant neoplasm of lower third of esophagus: Secondary | ICD-10-CM

## 2020-12-20 NOTE — Progress Notes (Signed)
Urgent referral to Rad/Onc placed

## 2020-12-20 NOTE — Progress Notes (Signed)
The proposed treatment discussed in conference is for discussion purpose only and is not a binding recommendation.  The patients have not been physically examined, or presented with their treatment options.  Therefore, final treatment plans cannot be decided.  

## 2020-12-20 NOTE — Progress Notes (Signed)
GI Location of Tumor / Histology: lower third of esophagus  Alison Garner reports intermittent episodes of subxiphoid and back pain waking her up at night beginning in January of this year.  The episodes have been more frequent over recent months.  She developed solid dysphagia beginning in June.  Biopsies of Esophagus, biopsy, distal stricture - ADENOCARCINOMA - SEE COMMENT Microscopic Comment Based on the biopsy, the invasive adenocarcinoma is moderately differentiated.  Past/Anticipated interventions by surgeon, if any:  Procedure: Upper GI endoscopy Indications: Dysphagia, Abnormal cine-esophagram, Chest pain (non cardiac)  Past/Anticipated interventions by medical oncology, if any: Dr Benay Spice appears to be a candidate for a course of chemotherapy/radiation.  We will make a surgical referral if the staging PET scan does not show distant disease.  She may not be a surgical candidate based on her history of tobacco use and probable COPD.  Weight changes, if any: yes, approximately 5 pound weight loss in 6 months  Bowel/Bladder complaints, if any: yes, IBS, no bladder complaints  Nausea / Vomiting, if any: no  Pain issues, if any:  yes, pain between shoulder blades and bilateral rib pain  Any blood per rectum:   no  SAFETY ISSUES: Prior radiation? no Pacemaker/ICD? no Possible current pregnancy? no, postmenopausal Is the patient on methotrexate? no  Current Complaints/Details: liquid diet, opening at lower esophagus is 6mm, difficulty swallowing  Vitals:   12/25/20 0832  BP: (!) 127/53  Pulse: 100  Resp: 20  Temp: 97.8 F (36.6 C)  SpO2: 100%  Weight: 133 lb 9.6 oz (60.6 kg)  Height: 5\' 5"  (1.651 m)

## 2020-12-21 ENCOUNTER — Ambulatory Visit (HOSPITAL_COMMUNITY)
Admission: RE | Admit: 2020-12-21 | Discharge: 2020-12-21 | Disposition: A | Discharge status: Home / Self Care | Payer: Medicare Other | Source: Ambulatory Visit | Attending: Oncology | Admitting: Oncology

## 2020-12-21 ENCOUNTER — Other Ambulatory Visit: Payer: Self-pay

## 2020-12-21 DIAGNOSIS — C155 Malignant neoplasm of lower third of esophagus: Secondary | ICD-10-CM | POA: Diagnosis not present

## 2020-12-21 LAB — GLUCOSE, CAPILLARY: Glucose-Capillary: 122 mg/dL — ABNORMAL HIGH (ref 70–99)

## 2020-12-21 MED ORDER — FLUDEOXYGLUCOSE F - 18 (FDG) INJECTION
6.5000 | Freq: Once | INTRAVENOUS | Status: AC | PRN
  Administered 2020-12-21: 11:00:00 6.6 via INTRAVENOUS

## 2020-12-22 ENCOUNTER — Telehealth: Payer: Self-pay | Admitting: Internal Medicine

## 2020-12-22 NOTE — Telephone Encounter (Signed)
Patient called regarding Pantoprazole states it needs a override for the dosage and quaintly. Requested a call to insurance at: [phone]

## 2020-12-22 NOTE — Progress Notes (Signed)
Radiation Oncology         ([phone]) [phone] ________________________________  Initial Outpatient Consultation  Name: Alison Garner MRN: [id number]  Date: 12/25/2020  DOB: Apr 07, 1950  LO:VFIEPPIR, Vaughan Basta, MD  Ladell Pier, MD   REFERRING PHYSICIAN: Ladell Pier, MD  DIAGNOSIS: The encounter diagnosis was Malignant tumor of lower third of esophagus ([place]).  Adenocarcinoma of the distal esophagus    HISTORY OF PRESENT ILLNESS::Alison Garner is a 70 y.o. female who is accompanied by her daughter. she is seen as a courtesy of Dr. Learta Codding for an opinion concerning radiation therapy as part of management for her recently diagnosed esophageal cancer. The patient first reported intermittent episodes of subxiphoid and back pain waking her up at night beginning in January of this year. These episodes became gradually more frequent over recent months. This past June, the patient developed solid dysphagia, this continued to progress prompting a referral to Dr. Hilarie Fredrickson on 11/13/20.   Esophagram performed on 11/21/2020 by Dr. Hilarie Fredrickson revealed narrowing of the distal esophagus just above the GE junction.  (A 13 mm barium tablet was stuck in the distal esophagus just above the GE junction during this procedure).   Subsequently, the patient underwent endoscopy and biopsies on 12/13/20. Endoscopy revealed severe esophagitis with nodularity and stricturing found at 36 cm, and ulcerated mucosa. Biopsy collected of the esophagus revealed adenocarcinoma (Her2 negative).  Chest CT on 12/14/20 demonstrated an accentuated density along the esophageal lumen in the region of distal esophageal stricturing, just above the small type 1 hiatal hernia, noted as concerning for potential malignancy (although severe inflammation was not excluded).   The patient's case was discussed at the tumor board on 12/20/20.   PET on 12/21/20 demonstrated abnormal FDG uptake within the distal esophagus corresponding to the known  esophageal neoplasm. No signs of tracer avid metastasis or distant metastatic disease was appreciated.      PREVIOUS RADIATION THERAPY: No  PAST MEDICAL HISTORY:  Past Medical History:  Diagnosis Date   Arthritis    Carotid bruit    Diverticulosis    External hemorrhoids    Hiatal hernia    Hyperlipidemia    Hypothyroidism    Internal hemorrhoids    Irritable bowel syndrome    Kidney stones    Lichen planus    Macular cyst, hole, or pseudohole, unspecified eye    Thyroid disease    thyroid nodule   Vitamin D deficiency     PAST SURGICAL HISTORY: Past Surgical History:  Procedure Laterality Date   COLONOSCOPY     DILATION AND CURETTAGE OF UTERUS  1982    FAMILY HISTORY:  Family History  Problem Relation Age of Onset   Heart disease Mother    Hypertension Mother    Heart disease Father    Heart failure Father    Thyroid disease Brother    Heart disease Maternal Aunt    Other Maternal Aunt        MDS   Heart disease Maternal Uncle    Heart failure Maternal Uncle    Myasthenia gravis Paternal Aunt    Heart disease Paternal Uncle    Heart failure Paternal Uncle    Colon cancer Neg Hx    Esophageal cancer Neg Hx    Stomach cancer Neg Hx    Rectal cancer Neg Hx     SOCIAL HISTORY:  Social History   Tobacco Use   Smoking status: Every Day    Packs/day: 1.00    Years:  50.00    Pack years: 50.00    Types: Cigarettes   Smokeless tobacco: Former  Substance Use Topics   Alcohol use: Yes    Comment: rare   Drug use: No    ALLERGIES:  Allergies  Allergen Reactions   Bactrim [Sulfamethoxazole-Trimethoprim] Rash    MEDICATIONS:  Current Outpatient Medications  Medication Sig Dispense Refill   ezetimibe-simvastatin (VYTORIN) 10-20 MG tablet Take 1 tablet by mouth every other day.     imipramine (TOFRANIL) 25 MG tablet Take 25 mg by mouth daily. Takes 2 tablets at bedtime     levothyroxine (SYNTHROID, LEVOTHROID) 25 MCG tablet at bedtime.      pantoprazole (PROTONIX) 40 MG tablet Take 1 tablet (40 mg total) by mouth 2 (two) times daily. 90 tablet 3   sucralfate (CARAFATE) 1 GM/10ML suspension Take 10 mLs (1 g total) by mouth 4 (four) times daily. 420 mL 1   No current facility-administered medications for this encounter.    REVIEW OF SYSTEMS:  A 10+ POINT REVIEW OF SYSTEMS WAS OBTAINED including neurology, dermatology, psychiatry, cardiac, respiratory, lymph, extremities, GI, GU, musculoskeletal, constitutional, reproductive, HEENT.  She is unable to swallow solid foods at this time.  She does pure her food and swallows this okay.   PHYSICAL EXAM:  height is _0  (1.651 m) and weight is 133 lb 9.6 oz (60.6 kg). Her temperature is 97.8 F (36.6 C). Her blood pressure is 127/53 (abnormal) and her pulse is 100. Her respiration is 20 and oxygen saturation is 100%.   General: Alert and oriented, in no acute distress HEENT: Head is normocephalic. Extraocular movements are intact.  Neck: Neck is supple, no palpable cervical or supraclavicular lymphadenopathy. Heart: Regular in rate and rhythm with no murmurs, rubs, or gallops. Chest: Clear to auscultation bilaterally, with no rhonchi, wheezes, or rales. Abdomen: Soft, nontender, nondistended, with no rigidity or guarding. Extremities: No cyanosis or edema. Lymphatics: see Neck Exam Skin: No concerning lesions. Musculoskeletal: symmetric strength and muscle tone throughout. Neurologic: Cranial nerves II through XII are grossly intact. No obvious focalities. Speech is fluent. Coordination is intact. Psychiatric: Judgment and insight are intact. Affect is appropriate.   ECOG = 1  0 - Asymptomatic (Fully active, able to carry on all predisease activities without restriction)  1 - Symptomatic but completely ambulatory (Restricted in physically strenuous activity but ambulatory and able to carry out work of a light or sedentary nature. For example, light housework, office work)  2 -  Symptomatic, <50% in bed during the day (Ambulatory and capable of all self care but unable to carry out any work activities. Up and about more than 50% of waking hours)  3 - Symptomatic, >50% in bed, but not bedbound (Capable of only limited self-care, confined to bed or chair 50% or more of waking hours)  4 - Bedbound (Completely disabled. Cannot carry on any self-care. Totally confined to bed or chair)  5 - Death   Eustace Pen MM, Creech RH, Tormey DC, et al. [phone]). "Toxicity and response criteria of the [facility]". [place] Oncol. 5 (6): 649-55  LABORATORY DATA:  No results found for: WBC, HGB, HCT, MCV, PLT, NEUTROABS Lab Results  Component Value Date   CREATININE 0.90 12/14/2020      RADIOGRAPHY: CT CHEST W CONTRAST  Result Date: 12/14/2020 CLINICAL DATA:  Distal esophageal stricture/mass. EXAM: CT CHEST WITH CONTRAST TECHNIQUE: Multidetector CT imaging of the chest was performed during intravenous contrast administration. CONTRAST:  77m OMNIPAQUE  IOHEXOL 300 MG/ML  SOLN COMPARISON:  Esophagram 11/21/2020 FINDINGS: Cardiovascular: Coronary, aortic arch, and branch vessel atherosclerotic vascular disease. Mediastinum/Nodes: AP window lymph node 0.6 cm in short axis on image 48 series 2, within normal size limits. 0.8 cm right hilar lymph node on image 64 series 2, likewise within normal limits. No pathologic thoracic adenopathy is appreciated. High density along the lumen of the distal esophagus just above the small type 1 hiatal hernia is observed on image 106 series 2 and on image 96 series 6 and appears to correlate to the region of stricturing shown on esophagram of 11/21/2020. Appearance favors a mass along the mucosa and esophageal carcinoma is a distinct possibility. No adjacent adenopathy is observed. Lungs/Pleura: Mild biapical pleuroparenchymal scarring. Centrilobular emphysema. Minimal airway plugging in the right lower lobe. Mild scarring or atelectasis  laterally in the left lower lobe. Right posterior costophrenic angle inadvertently excluded. Upper Abdomen: Unremarkable Musculoskeletal: Old healed left lateral tenth rib fracture. Mild thoracic spondylosis. IMPRESSION: 1. Accentuated density along the esophageal lumen in the region of distal esophageal stricturing just above the small type 1 hiatal hernia, concerning for potential malignancy although severe inflammation along the mucosa could cause a similar appearance. Correlation with biopsy results recommended. No adjacent or other thoracic adenopathy identified. 2. Aortic Atherosclerosis (ICD10-I70.0) and Emphysema (ICD10-J43.9). Coronary atherosclerosis. 3. Minimal airway plugging in the right lower lobe. Electronically Signed   By: Van Clines M.D.   On: 12/14/2020 17:16   NM PET Image Initial (PI) Skull Base To Thigh (F-18 FDG)  Result Date: 12/21/2020 CLINICAL DATA:  Initial treatment strategy for esophageal neoplasm. EXAM: NUCLEAR MEDICINE PET SKULL BASE TO THIGH TECHNIQUE: 6.6 mCi F-18 FDG was injected intravenously. Full-ring PET imaging was performed from the skull base to thigh after the radiotracer. CT data was obtained and used for attenuation correction and anatomic localization. Fasting blood glucose: 122 mg/dl COMPARISON:  CT chest 12/14/2020 FINDINGS: Mediastinal blood pool activity: SUV max 2.7 Liver activity: SUV max NA NECK: No hypermetabolic lymph nodes in the neck. Incidental CT findings: none CHEST: No FDG avid supraclavicular, axillary, mediastinal, or hilar lymph nodes. FDG avid tumor measuring approximately 3.5 cm extends up to the GE junction. This has an SUV max of 7.68. No surrounding FDG avid lymph nodes identified in the posterior mediastinum. Moderate centrilobular emphysema identified. Scarring identified within the lateral left lung base. Incidental CT findings: Aortic atherosclerosis. Coronary artery calcifications. ABDOMEN/PELVIS: No abnormal hypermetabolic  activity within the liver, pancreas, adrenal glands, or spleen. No hypermetabolic lymph nodes in the abdomen or pelvis. Incidental CT findings: Aortic atherosclerosis.  No aneurysm. SKELETON: No focal hypermetabolic activity to suggest skeletal metastasis. Incidental CT findings: none IMPRESSION: 1. There is abnormal FDG uptake within the distal esophagus corresponding to known esophageal neoplasm. No signs of tracer avid nodal metastasis or distant metastatic disease. 2. Aortic Atherosclerosis (ICD10-I70.0) and Emphysema (ICD10-J43.9). 3. Coronary artery calcifications. Electronically Signed   By: Kerby Moors M.D.   On: 12/21/2020 12:46      IMPRESSION: Adenocarcinoma of the esophagus    Imaging and endoscopic evaluation shows that this lesion appears to be located in the distal esophagus without evidence of metastatic spread.  She will be a good candidate for a definitive course of radiation along with radiosensitizing chemotherapy.    In addition the patient will undergo endoscopic ultrasound to determine the stage of the primary lesion.  She will then see Dr. Kipp Brood for consideration for surgery after her initial course of radiation and  radiosensitizing chemotherapy.  Today, I talked to the patient and daughter about the findings and work-up thus far.  We discussed the natural history of adenocarcinoma of the distal esophagus and general treatment, highlighting the role of radiotherapy in the management.  We discussed the available radiation techniques, and focused on the details of logistics and delivery.  We reviewed the anticipated acute and late sequelae associated with radiation in this setting.  The patient was encouraged to ask questions that I answered to the best of my ability.  A patient consent form was discussed and signed.  We retained a copy for our records.  The patient would like to proceed with radiation and will be scheduled for CT simulation.  PLAN: The patient will return for  CT simulation early next week with treatments to begin approximately a week later.  Anticipate 5-1/2 weeks of radiation therapy along with radiosensitizing chemotherapy.  Patient does live in the [place] area but will be staying in [place] for much of her treatment  where her daughter lives.   60 minutes of total time was spent for this patient encounter, including preparation, face-to-face counseling with the patient and coordination of care, physical exam, and documentation of the encounter.   ------------------------------------------------  Blair Promise, PhD, MD  This document serves as a record of services personally performed by Gery Pray, MD. It was created on his behalf by Roney Mans, a trained medical scribe. The creation of this record is based on the scribe's personal observations and the provider's statements to them. This document has been checked and approved by the attending provider.

## 2020-12-25 ENCOUNTER — Other Ambulatory Visit: Payer: Self-pay

## 2020-12-25 ENCOUNTER — Ambulatory Visit
Admission: RE | Admit: 2020-12-25 | Discharge: 2020-12-25 | Disposition: A | Discharge status: Home / Self Care | Payer: Medicare Other | Source: Ambulatory Visit | Attending: Radiation Oncology | Admitting: Radiation Oncology

## 2020-12-25 ENCOUNTER — Encounter: Payer: Self-pay | Admitting: Radiation Oncology

## 2020-12-25 VITALS — BP 127/53 | HR 100 | Temp 97.8°F | Resp 20 | Ht 65.0 in | Wt 133.6 lb

## 2020-12-25 DIAGNOSIS — M47814 Spondylosis without myelopathy or radiculopathy, thoracic region: Secondary | ICD-10-CM | POA: Insufficient documentation

## 2020-12-25 DIAGNOSIS — K449 Diaphragmatic hernia without obstruction or gangrene: Secondary | ICD-10-CM | POA: Insufficient documentation

## 2020-12-25 DIAGNOSIS — K222 Esophageal obstruction: Secondary | ICD-10-CM | POA: Diagnosis not present

## 2020-12-25 DIAGNOSIS — I700 Atherosclerosis of aorta: Secondary | ICD-10-CM | POA: Diagnosis not present

## 2020-12-25 DIAGNOSIS — Z79899 Other long term (current) drug therapy: Secondary | ICD-10-CM | POA: Insufficient documentation

## 2020-12-25 DIAGNOSIS — E785 Hyperlipidemia, unspecified: Secondary | ICD-10-CM | POA: Insufficient documentation

## 2020-12-25 DIAGNOSIS — C155 Malignant neoplasm of lower third of esophagus: Secondary | ICD-10-CM | POA: Insufficient documentation

## 2020-12-25 DIAGNOSIS — F17210 Nicotine dependence, cigarettes, uncomplicated: Secondary | ICD-10-CM | POA: Diagnosis not present

## 2020-12-25 DIAGNOSIS — E079 Disorder of thyroid, unspecified: Secondary | ICD-10-CM | POA: Diagnosis not present

## 2020-12-25 DIAGNOSIS — K589 Irritable bowel syndrome without diarrhea: Secondary | ICD-10-CM | POA: Diagnosis not present

## 2020-12-25 DIAGNOSIS — K209 Esophagitis, unspecified without bleeding: Secondary | ICD-10-CM | POA: Diagnosis not present

## 2020-12-25 DIAGNOSIS — E039 Hypothyroidism, unspecified: Secondary | ICD-10-CM | POA: Insufficient documentation

## 2020-12-25 DIAGNOSIS — E559 Vitamin D deficiency, unspecified: Secondary | ICD-10-CM | POA: Diagnosis not present

## 2020-12-25 DIAGNOSIS — I2510 Atherosclerotic heart disease of native coronary artery without angina pectoris: Secondary | ICD-10-CM | POA: Insufficient documentation

## 2020-12-25 DIAGNOSIS — J432 Centrilobular emphysema: Secondary | ICD-10-CM | POA: Diagnosis not present

## 2020-12-25 NOTE — Progress Notes (Signed)
See MD note for nursing evaluation. °

## 2020-12-26 ENCOUNTER — Ambulatory Visit (HOSPITAL_COMMUNITY): Payer: Medicare Other

## 2020-12-27 ENCOUNTER — Other Ambulatory Visit: Payer: Self-pay

## 2020-12-27 ENCOUNTER — Inpatient Hospital Stay (HOSPITAL_BASED_OUTPATIENT_CLINIC_OR_DEPARTMENT_OTHER): Payer: Medicare Other | Admitting: Oncology

## 2020-12-27 ENCOUNTER — Inpatient Hospital Stay: Payer: Medicare Other | Admitting: Nutrition

## 2020-12-27 VITALS — BP 164/71 | HR 96 | Temp 97.8°F | Resp 18 | Ht 65.0 in | Wt 134.8 lb

## 2020-12-27 DIAGNOSIS — J449 Chronic obstructive pulmonary disease, unspecified: Secondary | ICD-10-CM | POA: Diagnosis not present

## 2020-12-27 DIAGNOSIS — E039 Hypothyroidism, unspecified: Secondary | ICD-10-CM | POA: Diagnosis not present

## 2020-12-27 DIAGNOSIS — C155 Malignant neoplasm of lower third of esophagus: Secondary | ICD-10-CM

## 2020-12-27 DIAGNOSIS — R1310 Dysphagia, unspecified: Secondary | ICD-10-CM | POA: Diagnosis not present

## 2020-12-27 DIAGNOSIS — E785 Hyperlipidemia, unspecified: Secondary | ICD-10-CM | POA: Diagnosis not present

## 2020-12-27 DIAGNOSIS — K589 Irritable bowel syndrome without diarrhea: Secondary | ICD-10-CM | POA: Diagnosis not present

## 2020-12-27 DIAGNOSIS — Z720 Tobacco use: Secondary | ICD-10-CM | POA: Diagnosis not present

## 2020-12-27 DIAGNOSIS — K649 Unspecified hemorrhoids: Secondary | ICD-10-CM | POA: Diagnosis not present

## 2020-12-27 MED ORDER — PROCHLORPERAZINE MALEATE 10 MG PO TABS: 10.0000 mg | ORAL_TABLET | Freq: Four times a day (QID) | ORAL | 1 refills | Status: DC | PRN

## 2020-12-27 MED ORDER — DEXAMETHASONE 2 MG PO TABS: 10.0000 mg | ORAL_TABLET | ORAL | 0 refills | Status: DC

## 2020-12-27 NOTE — Progress Notes (Addendum)
  [facility] OFFICE PROGRESS NOTE   Diagnosis: Esophagus cancer  INTERVAL HISTORY:   Alison Garner returns as scheduled.  She is tolerating liquids.  No recurrent chest/back pain.  No new complaint.  She saw Dr. Sondra Come and is scheduled for radiation simulation next week.  Objective:  Vital signs in last 24 hours:  Blood pressure (!) 164/71, pulse 96, temperature 97.8 F (36.6 C), temperature source Oral, resp. rate 18, height _0  (1.651 m), weight 134 lb 12.8 oz (61.1 kg), SpO2 100 %.    Resp: Coarse rhonchi at the left posterior chest, no respiratory distress Cardio: Regular rate and rhythm GI: No mass, nontender, no hepatomegaly Vascular: No leg edema  Lab Results:  No results found for: WBC, HGB, HCT, MCV, PLT, NEUTROABS  CMP  Lab Results  Component Value Date   CREATININE 0.90 12/14/2020    No results found for: CEA1, CEA, JSE831, CA125  No results found for: INR, LABPROT  Imaging: PET images from 12/21/2020 reviewed with Ms. Frentz her daughter  Medications: I have reviewed the patient's current medications.   Assessment/Plan: Distal esophagus cancer Esophagram 11/21/2020-moderate to severe esophageal dysmotility, narrowing of the distal esophagus just above the GE junction to 4 mm, 13 mm barium tablet stuck in the distal esophagus Upper endoscopy 12/13/2020-severe esophagitis with nodularity and stricturing at 36 cm-adenocarcinoma, HER2 negative, no loss of mismatch repair protein expression, PD-L1 combined positive score less than 1% CT chest 12/14/2020-accentuated density at the distal esophageal lumen above a small type I hiatal hernia concerning for malignancy, nonpathologically enlarged AP window and right hilar nodes PET 12/21/2020-abnormal FDG uptake at the distal esophagus, no evidence of nodal or distant metastatic disease Solid dysphagia secondary #1 COPD on chest CT 12/14/2020 Hemorrhoids Hyperlipidemia Irritable bowel  syndrome Hypothyroidism Tobacco use    Disposition:  AlisonAlison Garner has been diagnosed with adenocarcinoma the distal esophagus.  She appears to have locally advanced disease based on the staging evaluation to date.  She is scheduled for consultation with Dr. Kipp Brood next week, but is not sure she will agree to surgery.  She may not be a surgical candidate.  We will refer her for a staging EUS if she is a candidate for surgery.  I recommend proceeding with concurrent weekly Taxol/carboplatin and radiation.  We reviewed potential toxicities associated with the Taxol/carboplatin regimen including the chance of an allergic reaction, nausea, alopecia, and hematologic toxicity.  We discussed the bone pain and neuropathy associated with paclitaxel.  She agrees to proceed.  She will receive Decadron premedication prior to the first cycle of Taxol.  She will be scheduled for weekly Taxol beginning 01/08/2021.  She will return for an office visit and chemotherapy on 01/15/2021.  A chemotherapy plan was entered today.  Betsy Coder, MD  12/27/2020  4:21 PM

## 2020-12-27 NOTE — Progress Notes (Signed)
70 year old female diagnosed with distal esophageal cancer and followed by Dr. Benay Spice and Dr. Sondra Come.  Plans for concurrent chemo radiation therapy.  PMH includes Diverticulosis, HLD, Hypothyroidism, IBS, Vit D deficiency, COPD.  Medications include B complex, Synthroid, MVI, Protonix and Carafate.  Labs reviewed.  Height: 5'5". Weight: 134.8 pounds Nov 23. UBW: 135 pounds per chart Oct 10 BMI: 22.43 ECOG: 1  Patient noted to have solid food dysphagia and consuming a pureed diet. CT sim is scheduled for 01/01/21. Patient has been consuming very soft, pureed foods. She like [employer] Nutrition Plan shakes (150 kcal, 30 gm pro) She tolerates Boost but doesn't love it. Weight stable the past 6 months but had 9 pound wt loss between Jan and May 2022.  Nutrition Diagnosis: Food and Nutrition Related Knowledge Deficit related to Esophagus cancer/Dysphagia as evidenced by no prior need for nutrition related information.  Intervention: Educated to continue pureeing foods to consistency tolerated. Eat small amounts of food often. Continue oral nutrition supplements at least BID. Provided fact sheets on ways to increase calories and protein and recipes. Questions were answered and teach back used. Contact information given.  Monitoring, Evaluation, Goals: Patient will tolerate adequate calories and protein to minimize wt loss.  Next Visit: To be scheduled with treatment.

## 2020-12-27 NOTE — Progress Notes (Signed)
START ON PATHWAY REGIMEN - Gastroesophageal     Administer weekly during RT:     Paclitaxel      Carboplatin   **Always confirm dose/schedule in your pharmacy ordering system**  Patient Characteristics: Esophageal & GE Junction, Adenocarcinoma, Preoperative or Nonsurgical Candidate (Clinical Staging), cT2 or Higher or cN+, Surgical Candidate (Up to cT4a) - Preoperative Therapy, Esophageal Histology: Adenocarcinoma Disease Classification: Esophageal Therapeutic Status: Preoperative or Nonsurgical Candidate (Clinical Staging) AJCC Grade: Staged < 8th Ed. AJCC 8 Stage Grouping: Staged < 8th Ed. AJCC T Category: Staged < 8th Ed. AJCC N Category: Staged < 8th Ed. AJCC M Category: Staged < 8th Ed. Intent of Therapy: Curative Intent, Discussed with Patient

## 2021-01-01 ENCOUNTER — Other Ambulatory Visit: Payer: Self-pay

## 2021-01-01 ENCOUNTER — Inpatient Hospital Stay: Payer: Medicare Other

## 2021-01-01 ENCOUNTER — Ambulatory Visit
Admission: RE | Admit: 2021-01-01 | Discharge: 2021-01-01 | Disposition: A | Discharge status: Home / Self Care | Payer: Medicare Other | Source: Ambulatory Visit | Attending: Radiation Oncology | Admitting: Radiation Oncology

## 2021-01-01 DIAGNOSIS — R1310 Dysphagia, unspecified: Secondary | ICD-10-CM | POA: Insufficient documentation

## 2021-01-01 DIAGNOSIS — C155 Malignant neoplasm of lower third of esophagus: Secondary | ICD-10-CM | POA: Diagnosis not present

## 2021-01-01 DIAGNOSIS — E785 Hyperlipidemia, unspecified: Secondary | ICD-10-CM | POA: Insufficient documentation

## 2021-01-01 DIAGNOSIS — K649 Unspecified hemorrhoids: Secondary | ICD-10-CM | POA: Insufficient documentation

## 2021-01-01 DIAGNOSIS — E039 Hypothyroidism, unspecified: Secondary | ICD-10-CM | POA: Insufficient documentation

## 2021-01-01 DIAGNOSIS — J449 Chronic obstructive pulmonary disease, unspecified: Secondary | ICD-10-CM | POA: Insufficient documentation

## 2021-01-01 DIAGNOSIS — K589 Irritable bowel syndrome without diarrhea: Secondary | ICD-10-CM | POA: Insufficient documentation

## 2021-01-01 DIAGNOSIS — Z720 Tobacco use: Secondary | ICD-10-CM | POA: Insufficient documentation

## 2021-01-01 LAB — CMP (CANCER CENTER ONLY)
ALT: 13 U/L (ref 0–44)
AST: 16 U/L (ref 15–41)
Albumin: 4.4 g/dL (ref 3.5–5.0)
Alkaline Phosphatase: 55 U/L (ref 38–126)
Anion gap: 9 (ref 5–15)
BUN: 14 mg/dL (ref 8–23)
CO2: 28 mmol/L (ref 22–32)
Calcium: 10.3 mg/dL (ref 8.9–10.3)
Chloride: 94 mmol/L — ABNORMAL LOW (ref 98–111)
Creatinine: 0.72 mg/dL (ref 0.44–1.00)
GFR, Estimated: >60 mL/min (ref >60)
Glucose, Bld: 127 mg/dL — ABNORMAL HIGH (ref 70–99)
Potassium: 3.8 mmol/L (ref 3.5–5.1)
Sodium: 131 mmol/L — ABNORMAL LOW (ref 135–145)
Total Bilirubin: 0.6 mg/dL (ref 0.3–1.2)
Total Protein: 6.7 g/dL (ref 6.5–8.1)

## 2021-01-01 LAB — CBC WITH DIFFERENTIAL (CANCER CENTER ONLY)
Abs Immature Granulocytes: 0.02 10*3/uL (ref 0.00–0.07)
Basophils Absolute: 0 10*3/uL (ref 0.0–0.1)
Basophils Relative: 0 %
Eosinophils Absolute: 0 10*3/uL (ref 0.0–0.5)
Eosinophils Relative: 0 %
HCT: 40.7 % (ref 36.0–46.0)
Hemoglobin: 14 g/dL (ref 12.0–15.0)
Immature Granulocytes: 0 %
Lymphocytes Relative: 25 %
Lymphs Abs: 2 10*3/uL (ref 0.7–4.0)
MCH: 31.9 pg (ref 26.0–34.0)
MCHC: 34.4 g/dL (ref 30.0–36.0)
MCV: 92.7 fL (ref 80.0–100.0)
Monocytes Absolute: 0.6 10*3/uL (ref 0.1–1.0)
Monocytes Relative: 7 %
Neutro Abs: 5.4 10*3/uL (ref 1.7–7.7)
Neutrophils Relative %: 68 %
Platelet Count: 205 10*3/uL (ref 150–400)
RBC: 4.39 MIL/uL (ref 3.87–5.11)
RDW: 13.3 % (ref 11.5–15.5)
WBC Count: 8 10*3/uL (ref 4.0–10.5)
nRBC: 0 % (ref 0.0–0.2)

## 2021-01-02 ENCOUNTER — Other Ambulatory Visit: Payer: Self-pay | Admitting: Internal Medicine

## 2021-01-02 ENCOUNTER — Encounter: Payer: Self-pay | Admitting: *Deleted

## 2021-01-02 NOTE — Progress Notes (Signed)
PATIENT NAVIGATOR PROGRESS NOTE  Name: Alison Garner Date: 01/02/2021 MRN: [id number]  DOB: Aug 26, 1950   Reason for visit:  F/U call   Comments:  Spoke with Ms Severs regarding starting treatment and feelings surrounding starting. We discussed in detail taking medications, timing and crushing. Will talk with pharmacist and create a medication grid on taking medications most effectively    Time spent counseling/coordinating care: 15-30 minutes

## 2021-01-03 ENCOUNTER — Encounter: Payer: Self-pay | Admitting: Oncology

## 2021-01-04 ENCOUNTER — Encounter: Payer: Self-pay | Admitting: *Deleted

## 2021-01-04 NOTE — Progress Notes (Signed)
PATIENT NAVIGATOR PROGRESS NOTE  Name: Melesa Lecy Date: 01/04/2021 MRN: [id number]  DOB: Jan 31, 1951   Reason for visit:  Phone call  Comments:  Talked with Ms Toniann Ket after consulting with Roselind Messier PharmD to review how to take at home medications. Created a medication chart and reviewed it with her. Will provide physical copy at Monday, Dec 5 appt.     Time spent counseling/coordinating care: 15-30 minutes

## 2021-01-04 NOTE — Progress Notes (Signed)
[address]       [place] [address]             [phone]                    Evyn Kaye Burkemper [place] Medical Record #[id number] Date of Birth: 1950-07-25  Referring: Ladell Pier, MD Primary Care: [name], MD Primary Cardiologist: None  Chief Complaint:    Chief Complaint  Patient presents with   Esophageal Cancer    Surgical consult, PET Scan 12/21/20, Chest CT 12/14/20, Upper Endo 12/13/20, BARIUM SWALLOW 11/21/20    History of Present Illness:    Alison Garner 70 y.o. female presents to discuss surgical evaluation of a newly diagnosed distal esophageal cancer.  She was in her normal state of health until she started developing some dysphagia to solids.  She subsequently noted 10 pound weight loss.  She underwent an upper endoscopy which showed a stricture at the GE junction.  Biopsies were consistent with adenocarcinoma.  She is already met with medical oncology and is scheduled to meet with radiation oncology in the coming week.    Smoking Hx: Current smoker   Zubrod Score: At the time of surgery this patient's most appropriate activity status/level should be described as: [x]     0    Normal activity, no symptoms []     1    Restricted in physical strenuous activity but ambulatory, able to do out light work []     2    Ambulatory and capable of self care, unable to do work activities, up and about               >50 % of waking hours                              []     3    Only limited self care, in bed greater than 50% of waking hours []     4    Completely disabled, no self care, confined to bed or chair []     5    Moribund   Past Medical History:  Diagnosis Date   Arthritis    Carotid bruit    Diverticulosis    External hemorrhoids    Hiatal hernia    Hyperlipidemia    Hypothyroidism    Internal hemorrhoids    Irritable bowel syndrome    Kidney stones    Lichen planus    Macular cyst, hole, or pseudohole, unspecified  eye    Thyroid disease    thyroid nodule   Vitamin D deficiency     Past Surgical History:  Procedure Laterality Date   COLONOSCOPY     DILATION AND CURETTAGE OF UTERUS  1982    Family History  Problem Relation Age of Onset   Heart disease Mother    Hypertension Mother    Heart disease Father    Heart failure Father    Thyroid disease Brother    Heart disease Maternal Aunt    Other Maternal Aunt        MDS   Heart disease Maternal Uncle    Heart failure Maternal Uncle    Myasthenia gravis Paternal Aunt    Heart disease Paternal Uncle    Heart failure Paternal Uncle    Colon cancer Neg Hx    Esophageal cancer Neg Hx    Stomach cancer  Neg Hx    Rectal cancer Neg Hx      Social History   Tobacco Use  Smoking Status Every Day   Packs/day: 1.00   Years: 50.00   Pack years: 50.00   Types: Cigarettes  Smokeless Tobacco Former    Social History   Substance and Sexual Activity  Alcohol Use Yes   Comment: rare     Allergies  Allergen Reactions   Bactrim [Sulfamethoxazole-Trimethoprim] Rash    Current Outpatient Medications  Medication Sig Dispense Refill   dexamethasone (DECADRON) 2 MG tablet Take 5 tablets (10 mg total) by mouth as directed. Take 10 mg (#5 tablets) at 10 pm night before 1st chemotherapy and 10 mg (#5 tablets) at 6 am day of  1st chemotherapy 10 tablet 0   ezetimibe-simvastatin (VYTORIN) 10-20 MG tablet Take 1 tablet by mouth every other day.     imipramine (TOFRANIL) 25 MG tablet Take 25 mg by mouth daily. Takes 2 tablets at bedtime     levothyroxine (SYNTHROID, LEVOTHROID) 25 MCG tablet at bedtime.     pantoprazole (PROTONIX) 40 MG tablet Take 1 tablet (40 mg total) by mouth 2 (two) times daily. 90 tablet 3   prochlorperazine (COMPAZINE) 10 MG tablet Take 1 tablet (10 mg total) by mouth every 6 (six) hours as needed for nausea. 60 tablet 1   sucralfate (CARAFATE) 1 GM/10ML suspension SHAKE LIQUID AND TAKE 10 ML(1 GRAM) BY MOUTH FOUR TIMES  DAILY 420 mL 1   No current facility-administered medications for this visit.    Review of Systems  Constitutional:  Positive for malaise/fatigue and weight loss.  Respiratory:  Negative for shortness of breath.   Cardiovascular:  Negative for chest pain.  Gastrointestinal:  Positive for abdominal pain and heartburn.    PHYSICAL EXAMINATION: BP 121/69   Pulse 100   Resp 20   Ht 5' 5"  (1.651 m)   Wt 134 lb (60.8 kg)   SpO2 98% Comment: RA  BMI 22.30 kg/m  Physical Exam Constitutional:      General: She is not in acute distress.    Appearance: Normal appearance. She is normal weight. She is not ill-appearing.  HENT:     Head: Normocephalic and atraumatic.  Eyes:     Extraocular Movements: Extraocular movements intact.  Cardiovascular:     Rate and Rhythm: Tachycardia present.  Pulmonary:     Effort: Pulmonary effort is normal.  Abdominal:     General: Abdomen is flat. There is no distension.  Musculoskeletal:        General: Normal range of motion.  Neurological:     Mental Status: She is alert.    Diagnostic Studies & Laboratory data:    CT Scan: IMPRESSION: 1. Accentuated density along the esophageal lumen in the region of distal esophageal stricturing just above the small type 1 hiatal hernia, concerning for potential malignancy although severe inflammation along the mucosa could cause a similar appearance. Correlation with biopsy results recommended. No adjacent or other thoracic adenopathy identified. 2. Aortic Atherosclerosis (ICD10-I70.0) and Emphysema (ICD10-J43.9). Coronary atherosclerosis. 3. Minimal airway plugging in the right lower lobe.  PET/CT: IMPRESSION: 1. There is abnormal FDG uptake within the distal esophagus corresponding to known esophageal neoplasm. No signs of tracer avid nodal metastasis or distant metastatic disease. 2. Aortic Atherosclerosis (ICD10-I70.0) and Emphysema (ICD10-J43.9). 3. Coronary artery calcifications.  EGD/EUS:  12/13/2020 - Severe esophagitis with nodularity and stricturing was found 36 cm from the incisors. The narrowing could not be traversed with  the adult upper endoscope. The mucosa is ulcerated and I am concerned about neoplasm though definitive tumor was not confidently identified today. Multiple biopsies were taken with a cold forceps for histology. - An examination of the stomach was not performed. - An examination of the duodenum was not performed  Path: Adenocarcinoma Radiation Hx: Scheduled to start radiation in December 5     I have independently reviewed the above radiology studies  and reviewed the findings with the patient.   Recent Lab Findings: Lab Results  Component Value Date   WBC 8.0 01/01/2021   HGB 14.0 01/01/2021   HCT 40.7 01/01/2021   PLT 205 01/01/2021   GLUCOSE 127 (H) 01/01/2021   ALT 13 01/01/2021   AST 16 01/01/2021   NA 131 (L) 01/01/2021   K 3.8 01/01/2021   CL 94 (L) 01/01/2021   CREATININE 0.72 01/01/2021   BUN 14 01/01/2021   CO2 28 01/01/2021        Assessment / Plan:   70 year old female with recent diagnosis of distal esophageal adenocarcinoma.  She is scheduled to begin her radiation therapy sometime in the next few weeks.  She does have a distal esophageal stricture which they were unable to traverse with the regular gastroscope and thus it is unlikely that she will be able to undergo an EUS.  Covered the specifics of a robotic assisted Ivor Lewis esophagectomy.  She did express concern as her surgical candidacy given the fact that she is a current smoker and has COPD.  From a cardiopulmonary standpoint she only gets short of breath when she goes up several flights of stairs.  She denies any chest pain.  She states that she feels that she may be too old to undergo surgical resection, but on presentation I think that she would be able to tolerate this without much difficulty.  She does live alone but does have strong family support in the area.   We will revisit things with her in the coming weeks that she completes her neoadjuvant therapy.    Virtual visit with her in 6 weeks.     I  spent 55 minutes with the patient face to face counseling and coordination of care.    Lajuana Matte 01/05/2021 5:11 PM

## 2021-01-05 ENCOUNTER — Institutional Professional Consult (permissible substitution) (INDEPENDENT_AMBULATORY_CARE_PROVIDER_SITE_OTHER): Payer: Medicare Other | Admitting: Thoracic Surgery (Cardiothoracic Vascular Surgery)

## 2021-01-05 ENCOUNTER — Other Ambulatory Visit: Payer: Self-pay

## 2021-01-05 VITALS — BP 121/69 | HR 100 | Resp 20 | Ht 65.0 in | Wt 134.0 lb

## 2021-01-05 DIAGNOSIS — C155 Malignant neoplasm of lower third of esophagus: Secondary | ICD-10-CM | POA: Diagnosis not present

## 2021-01-06 ENCOUNTER — Other Ambulatory Visit: Payer: Self-pay | Admitting: Oncology

## 2021-01-06 DIAGNOSIS — R300 Dysuria: Secondary | ICD-10-CM | POA: Diagnosis not present

## 2021-01-06 DIAGNOSIS — C155 Malignant neoplasm of lower third of esophagus: Secondary | ICD-10-CM | POA: Diagnosis present

## 2021-01-06 DIAGNOSIS — Z5111 Encounter for antineoplastic chemotherapy: Secondary | ICD-10-CM | POA: Diagnosis not present

## 2021-01-08 ENCOUNTER — Ambulatory Visit
Admission: RE | Admit: 2021-01-08 | Discharge: 2021-01-08 | Disposition: A | Discharge status: Home / Self Care | Payer: Medicare Other | Source: Ambulatory Visit | Attending: Radiation Oncology | Admitting: Radiation Oncology

## 2021-01-08 ENCOUNTER — Encounter: Payer: Self-pay | Admitting: *Deleted

## 2021-01-08 ENCOUNTER — Other Ambulatory Visit: Payer: Medicare Other

## 2021-01-08 ENCOUNTER — Other Ambulatory Visit: Payer: Self-pay

## 2021-01-08 ENCOUNTER — Inpatient Hospital Stay: Payer: Medicare Other | Attending: Oncology

## 2021-01-08 VITALS — BP 158/60 | HR 75 | Temp 97.5°F | Resp 18 | Wt 134.2 lb

## 2021-01-08 DIAGNOSIS — R300 Dysuria: Secondary | ICD-10-CM | POA: Insufficient documentation

## 2021-01-08 DIAGNOSIS — Z5111 Encounter for antineoplastic chemotherapy: Secondary | ICD-10-CM | POA: Diagnosis not present

## 2021-01-08 DIAGNOSIS — C155 Malignant neoplasm of lower third of esophagus: Secondary | ICD-10-CM

## 2021-01-08 MED ORDER — SODIUM CHLORIDE 0.9 % IV SOLN
148.0000 mg | Freq: Once | INTRAVENOUS | Status: AC
  Administered 2021-01-08: 150 mg via INTRAVENOUS
  Filled 2021-01-08: qty 15

## 2021-01-08 MED ORDER — PALONOSETRON HCL INJECTION 0.25 MG/5ML
0.2500 mg | Freq: Once | INTRAVENOUS | Status: AC
  Administered 2021-01-08: 0.25 mg via INTRAVENOUS
  Filled 2021-01-08: qty 5

## 2021-01-08 MED ORDER — SODIUM CHLORIDE 0.9 % IV SOLN
50.0000 mg/m2 | Freq: Once | INTRAVENOUS | Status: AC
  Administered 2021-01-08: 84 mg via INTRAVENOUS
  Filled 2021-01-08: qty 14

## 2021-01-08 MED ORDER — SODIUM CHLORIDE 0.9 % IV SOLN
10.0000 mg | Freq: Once | INTRAVENOUS | Status: AC
  Administered 2021-01-08: 10 mg via INTRAVENOUS
  Filled 2021-01-08: qty 1

## 2021-01-08 MED ORDER — DIPHENHYDRAMINE HCL 50 MG/ML IJ SOLN
25.0000 mg | Freq: Once | INTRAMUSCULAR | Status: AC
  Administered 2021-01-08: 25 mg via INTRAVENOUS
  Filled 2021-01-08: qty 1

## 2021-01-08 MED ORDER — FAMOTIDINE 20 MG IN NS 100 ML IVPB
20.0000 mg | Freq: Once | INTRAVENOUS | Status: AC
  Administered 2021-01-08: 20 mg via INTRAVENOUS
  Filled 2021-01-08: qty 100

## 2021-01-08 MED ORDER — SODIUM CHLORIDE 0.9 % IV SOLN: Freq: Once | INTRAVENOUS | Status: AC

## 2021-01-08 NOTE — Progress Notes (Signed)
PATIENT NAVIGATOR PROGRESS NOTE  Name: Alison Garner Date: 01/08/2021 MRN: [id number]  DOB: 01-18-1951   Reason for visit:  First chemo treatment  Comments:  Met with Ms Dade during first chemo treatment, discussed and reviewed at home medication chart. Reviewed how to take anti nausea meds as needed and to call with any questions or issues    Time spent counseling/coordinating care: 15-30 minutes

## 2021-01-08 NOTE — Patient Instructions (Addendum)
[facility]  Discharge Instructions: Thank you for choosing [facility] to provide your oncology and hematology care.   If you have a lab appointment with the [facility], please go directly to the [facility] and check in at the registration area.   Wear comfortable clothing and clothing appropriate for easy access to any Portacath or PICC line.   We strive to give you quality time with your provider. You may need to reschedule your appointment if you arrive late (15 or more minutes).  Arriving late affects you and other patients whose appointments are after yours.  Also, if you miss three or more appointments without notifying the office, you may be dismissed from the clinic at the provider's discretion.      For prescription refill requests, have your pharmacy contact our office and allow 72 hours for refills to be completed.    Today you received the following chemotherapy and/or immunotherapy agents Paclitaxel, Carboplatin      To help prevent nausea and vomiting after your treatment, we encourage you to take your nausea medication as directed.  BELOW ARE SYMPTOMS THAT SHOULD BE REPORTED IMMEDIATELY: *FEVER GREATER THAN 100.4 F (38 C) OR HIGHER *CHILLS OR SWEATING *NAUSEA AND VOMITING THAT IS NOT CONTROLLED WITH YOUR NAUSEA MEDICATION *UNUSUAL SHORTNESS OF BREATH *UNUSUAL BRUISING OR BLEEDING *URINARY PROBLEMS (pain or burning when urinating, or frequent urination) *BOWEL PROBLEMS (unusual diarrhea, constipation, pain near the anus) TENDERNESS IN MOUTH AND THROAT WITH OR WITHOUT PRESENCE OF ULCERS (sore throat, sores in mouth, or a toothache) UNUSUAL RASH, SWELLING OR PAIN  UNUSUAL VAGINAL DISCHARGE OR ITCHING   Items with * indicate a potential emergency and should be followed up as soon as possible or go to the Emergency Department if any problems should occur.  Please show the CHEMOTHERAPY ALERT CARD or IMMUNOTHERAPY ALERT CARD at  check-in to the Emergency Department and triage nurse.  Should you have questions after your visit or need to cancel or reschedule your appointment, please contact [facility]  Dept: [phone]  and follow the prompts.  Office hours are 8:00 a.m. to 4:30 p.m. Monday - Friday. Please note that voicemails left after 4:00 p.m. may not be returned until the following business day.  We are closed weekends and major holidays. You have access to a nurse at all times for urgent questions. Please call the main number to the clinic Dept: [phone] and follow the prompts.   For any non-urgent questions, you may also contact your provider using MyChart. We now offer e-Visits for anyone 58 and older to request care online for non-urgent symptoms. For details visit [email or web address].   Also download the MyChart app! Go to the app store, search "MyChart", open the app, select [facility], and log in with your MyChart username and password.  Due to Covid, a mask is required upon entering the hospital/clinic. If you do not have a mask, one will be given to you upon arrival. For doctor visits, patients may have 1 support person aged 13 or older with them. For treatment visits, patients cannot have anyone with them due to current Covid guidelines and our immunocompromised population.   Paclitaxel injection What is this medication? PACLITAXEL (PAK li TAX el) is a chemotherapy drug. It targets fast dividing cells, like cancer cells, and causes these cells to die. This medicine is used to treat ovarian cancer, breast cancer, lung cancer, Kaposi's sarcoma, and other cancers. This medicine  may be used for other purposes; ask your health care provider or pharmacist if you have questions. COMMON BRAND NAME(S): Onxol, Taxol What should I tell my care team before I take this medication? They need to know if you have any of these conditions: history of irregular heartbeat liver  disease low blood counts, like low white cell, platelet, or red cell counts lung or breathing disease, like asthma tingling of the fingers or toes, or other nerve disorder an unusual or allergic reaction to paclitaxel, alcohol, polyoxyethylated castor oil, other chemotherapy, other medicines, foods, dyes, or preservatives pregnant or trying to get pregnant breast-feeding How should I use this medication? This drug is given as an infusion into a vein. It is administered in a hospital or clinic by a specially trained health care professional. Talk to your pediatrician regarding the use of this medicine in children. Special care may be needed. Overdosage: If you think you have taken too much of this medicine contact a poison control center or emergency room at once. NOTE: This medicine is only for you. Do not share this medicine with others. What if I miss a dose? It is important not to miss your dose. Call your doctor or health care professional if you are unable to keep an appointment. What may interact with this medication? Do not take this medicine with any of the following medications: live virus vaccines This medicine may also interact with the following medications: antiviral medicines for hepatitis, HIV or AIDS certain antibiotics like erythromycin and clarithromycin certain medicines for fungal infections like ketoconazole and itraconazole certain medicines for seizures like carbamazepine, phenobarbital, phenytoin gemfibrozil nefazodone rifampin St. John's wort This list may not describe all possible interactions. Give your health care provider a list of all the medicines, herbs, non-prescription drugs, or dietary supplements you use. Also tell them if you smoke, drink alcohol, or use illegal drugs. Some items may interact with your medicine. What should I watch for while using this medication? Your condition will be monitored carefully while you are receiving this medicine. You  will need important blood work done while you are taking this medicine. This medicine can cause serious allergic reactions. To reduce your risk you will need to take other medicine(s) before treatment with this medicine. If you experience allergic reactions like skin rash, itching or hives, swelling of the face, lips, or tongue, tell your doctor or health care professional right away. In some cases, you may be given additional medicines to help with side effects. Follow all directions for their use. This drug may make you feel generally unwell. This is not uncommon, as chemotherapy can affect healthy cells as well as cancer cells. Report any side effects. Continue your course of treatment even though you feel ill unless your doctor tells you to stop. Call your doctor or health care professional for advice if you get a fever, chills or sore throat, or other symptoms of a cold or flu. Do not treat yourself. This drug decreases your body's ability to fight infections. Try to avoid being around people who are sick. This medicine may increase your risk to bruise or bleed. Call your doctor or health care professional if you notice any unusual bleeding. Be careful brushing and flossing your teeth or using a toothpick because you may get an infection or bleed more easily. If you have any dental work done, tell your dentist you are receiving this medicine. Avoid taking products that contain aspirin, acetaminophen, ibuprofen, naproxen, or ketoprofen unless instructed by  your doctor. These medicines may hide a fever. Do not become pregnant while taking this medicine. Women should inform their doctor if they wish to become pregnant or think they might be pregnant. There is a potential for serious side effects to an unborn child. Talk to your health care professional or pharmacist for more information. Do not breast-feed an infant while taking this medicine. Men are advised not to father a child while receiving this  medicine. This product may contain alcohol. Ask your pharmacist or healthcare provider if this medicine contains alcohol. Be sure to tell all healthcare providers you are taking this medicine. Certain medicines, like metronidazole and disulfiram, can cause an unpleasant reaction when taken with alcohol. The reaction includes flushing, headache, nausea, vomiting, sweating, and increased thirst. The reaction can last from 30 minutes to several hours. What side effects may I notice from receiving this medication? Side effects that you should report to your doctor or health care professional as soon as possible: allergic reactions like skin rash, itching or hives, swelling of the face, lips, or tongue breathing problems changes in vision fast, irregular heartbeat high or low blood pressure mouth sores pain, tingling, numbness in the hands or feet signs of decreased platelets or bleeding - bruising, pinpoint red spots on the skin, black, tarry stools, blood in the urine signs of decreased red blood cells - unusually weak or tired, feeling faint or lightheaded, falls signs of infection - fever or chills, cough, sore throat, pain or difficulty passing urine signs and symptoms of liver injury like dark yellow or brown urine; general ill feeling or flu-like symptoms; light-colored stools; loss of appetite; nausea; right upper belly pain; unusually weak or tired; yellowing of the eyes or skin swelling of the ankles, feet, hands unusually slow heartbeat Side effects that usually do not require medical attention (report to your doctor or health care professional if they continue or are bothersome): diarrhea hair loss loss of appetite muscle or joint pain nausea, vomiting pain, redness, or irritation at site where injected tiredness This list may not describe all possible side effects. Call your doctor for medical advice about side effects. You may report side effects to FDA at 1-800-FDA-1088. Where  should I keep my medication? This drug is given in a hospital or clinic and will not be stored at home. NOTE: This sheet is a summary. It may not cover all possible information. If you have questions about this medicine, talk to your doctor, pharmacist, or health care provider.  2022 Elsevier/Gold Standard (2020-10-10 00:00:00)  Carboplatin injection What is this medication? CARBOPLATIN (KAR boe pla tin) is a chemotherapy drug. It targets fast dividing cells, like cancer cells, and causes these cells to die. This medicine is used to treat ovarian cancer and many other cancers. This medicine may be used for other purposes; ask your health care provider or pharmacist if you have questions. COMMON BRAND NAME(S): Paraplatin What should I tell my care team before I take this medication? They need to know if you have any of these conditions: blood disorders hearing problems kidney disease recent or ongoing radiation therapy an unusual or allergic reaction to carboplatin, cisplatin, other chemotherapy, other medicines, foods, dyes, or preservatives pregnant or trying to get pregnant breast-feeding How should I use this medication? This drug is usually given as an infusion into a vein. It is administered in a hospital or clinic by a specially trained health care professional. Talk to your pediatrician regarding the use of this medicine in   children. Special care may be needed. Overdosage: If you think you have taken too much of this medicine contact a poison control center or emergency room at once. NOTE: This medicine is only for you. Do not share this medicine with others. What if I miss a dose? It is important not to miss a dose. Call your doctor or health care professional if you are unable to keep an appointment. What may interact with this medication? medicines for seizures medicines to increase blood counts like filgrastim, pegfilgrastim, sargramostim some antibiotics like amikacin,  gentamicin, neomycin, streptomycin, tobramycin vaccines Talk to your doctor or health care professional before taking any of these medicines: acetaminophen aspirin ibuprofen ketoprofen naproxen This list may not describe all possible interactions. Give your health care provider a list of all the medicines, herbs, non-prescription drugs, or dietary supplements you use. Also tell them if you smoke, drink alcohol, or use illegal drugs. Some items may interact with your medicine. What should I watch for while using this medication? Your condition will be monitored carefully while you are receiving this medicine. You will need important blood work done while you are taking this medicine. This drug may make you feel generally unwell. This is not uncommon, as chemotherapy can affect healthy cells as well as cancer cells. Report any side effects. Continue your course of treatment even though you feel ill unless your doctor tells you to stop. In some cases, you may be given additional medicines to help with side effects. Follow all directions for their use. Call your doctor or health care professional for advice if you get a fever, chills or sore throat, or other symptoms of a cold or flu. Do not treat yourself. This drug decreases your body's ability to fight infections. Try to avoid being around people who are sick. This medicine may increase your risk to bruise or bleed. Call your doctor or health care professional if you notice any unusual bleeding. Be careful brushing and flossing your teeth or using a toothpick because you may get an infection or bleed more easily. If you have any dental work done, tell your dentist you are receiving this medicine. Avoid taking products that contain aspirin, acetaminophen, ibuprofen, naproxen, or ketoprofen unless instructed by your doctor. These medicines may hide a fever. Do not become pregnant while taking this medicine. Women should inform their doctor if they wish  to become pregnant or think they might be pregnant. There is a potential for serious side effects to an unborn child. Talk to your health care professional or pharmacist for more information. Do not breast-feed an infant while taking this medicine. What side effects may I notice from receiving this medication? Side effects that you should report to your doctor or health care professional as soon as possible: allergic reactions like skin rash, itching or hives, swelling of the face, lips, or tongue signs of infection - fever or chills, cough, sore throat, pain or difficulty passing urine signs of decreased platelets or bleeding - bruising, pinpoint red spots on the skin, black, tarry stools, nosebleeds signs of decreased red blood cells - unusually weak or tired, fainting spells, lightheadedness breathing problems changes in hearing changes in vision chest pain high blood pressure low blood counts - This drug may decrease the number of white blood cells, red blood cells and platelets. You may be at increased risk for infections and bleeding. nausea and vomiting pain, swelling, redness or irritation at the injection site pain, tingling, numbness in the hands or feet   problems with balance, talking, walking trouble passing urine or change in the amount of urine Side effects that usually do not require medical attention (report to your doctor or health care professional if they continue or are bothersome): hair loss loss of appetite metallic taste in the mouth or changes in taste This list may not describe all possible side effects. Call your doctor for medical advice about side effects. You may report side effects to FDA at 1-800-FDA-1088. Where should I keep my medication? This drug is given in a hospital or clinic and will not be stored at home. NOTE: This sheet is a summary. It may not cover all possible information. If you have questions about this medicine, talk to your doctor, pharmacist,  or health care provider.  2022 Elsevier/Gold Standard (2007-07-01 00

## 2021-01-08 NOTE — Progress Notes (Signed)
Pharmacist Chemotherapy Monitoring - Initial Assessment    Anticipated start date: 01/08/21   The following has been reviewed per standard work regarding the patient's treatment regimen: The patient's diagnosis, treatment plan and drug doses, and organ/hematologic function Lab orders and baseline tests specific to treatment regimen  The treatment plan start date, drug sequencing, and pre-medications Prior authorization status  Patient's documented medication list, including drug-drug interaction screen and prescriptions for anti-emetics and supportive care specific to the treatment regimen The drug concentrations, fluid compatibility, administration routes, and timing of the medications to be used The patient's access for treatment and lifetime cumulative dose history, if applicable  The patient's medication allergies and previous infusion related reactions, if applicable   Changes made to treatment plan:  N/A  Follow up needed:  N/A   Patrica Duel, RPH, 01/08/2021  9:59 AM

## 2021-01-08 NOTE — Progress Notes (Signed)
Patient presents for treatment. RN assessment completed along with the following:  Labs/vitals reviewed - Yes, and within treatment parameters.  Ok to use labs from 01/01/21 Weight within 10% of previous measurement - Yes Oncology Treatment Attestation completed for current therapy- Yes, on date 12/27/20 Informed consent completed and reflects current therapy/intent - Yes, on date 01/08/21             Provider progress note reviewed - Patient not seen by provider today. Most recent note dated 12/27/20 reviewed. Treatment/Antibody/Supportive plan reviewed - Yes, and there are no adjustments needed for today's treatment. S&H and other orders reviewed - Yes, and there are no additional orders identified. Previous treatment date reviewed - Yes, and the appropriate amount of time has elapsed between treatments. Clinic Hand Off Received from - none   Patient to proceed with treatment.

## 2021-01-09 ENCOUNTER — Ambulatory Visit
Admission: RE | Admit: 2021-01-09 | Discharge: 2021-01-09 | Disposition: A | Discharge status: Home / Self Care | Payer: Medicare Other | Source: Ambulatory Visit | Attending: Radiation Oncology | Admitting: Radiation Oncology

## 2021-01-09 ENCOUNTER — Encounter: Payer: Self-pay | Admitting: *Deleted

## 2021-01-09 DIAGNOSIS — Z5111 Encounter for antineoplastic chemotherapy: Secondary | ICD-10-CM | POA: Diagnosis not present

## 2021-01-09 NOTE — Progress Notes (Signed)
PATIENT NAVIGATOR PROGRESS NOTE  Name: Alison Garner Date: 01/09/2021 MRN: [id number]  DOB: 1950/08/01   Reason for visit:  Follow up phone call after first treatment  Comments:  Spoke with [name] after first treatment, she states that she feels fine, eating and drinking without difficulty. No nausea, encouraged drinking 64oz a day of caffeine free fluids. Encouraged to call with any issues or questions    Time spent counseling/coordinating care: < 15 minutes

## 2021-01-10 ENCOUNTER — Ambulatory Visit
Admission: RE | Admit: 2021-01-10 | Discharge: 2021-01-10 | Disposition: A | Discharge status: Home / Self Care | Payer: Medicare Other | Source: Ambulatory Visit | Attending: Radiation Oncology | Admitting: Radiation Oncology

## 2021-01-10 ENCOUNTER — Ambulatory Visit: Payer: Medicare Other | Admitting: Radiation Oncology

## 2021-01-10 DIAGNOSIS — Z5111 Encounter for antineoplastic chemotherapy: Secondary | ICD-10-CM | POA: Diagnosis not present

## 2021-01-11 ENCOUNTER — Ambulatory Visit
Admission: RE | Admit: 2021-01-11 | Discharge: 2021-01-11 | Disposition: A | Discharge status: Home / Self Care | Payer: Medicare Other | Source: Ambulatory Visit | Attending: Radiation Oncology | Admitting: Radiation Oncology

## 2021-01-11 DIAGNOSIS — Z5111 Encounter for antineoplastic chemotherapy: Secondary | ICD-10-CM | POA: Diagnosis not present

## 2021-01-12 ENCOUNTER — Inpatient Hospital Stay: Payer: Medicare Other | Admitting: Dietician

## 2021-01-12 ENCOUNTER — Other Ambulatory Visit: Payer: Self-pay

## 2021-01-12 ENCOUNTER — Ambulatory Visit
Admission: RE | Admit: 2021-01-12 | Discharge: 2021-01-12 | Disposition: A | Discharge status: Home / Self Care | Payer: Medicare Other | Source: Ambulatory Visit | Attending: Radiation Oncology | Admitting: Radiation Oncology

## 2021-01-12 DIAGNOSIS — Z5111 Encounter for antineoplastic chemotherapy: Secondary | ICD-10-CM | POA: Diagnosis not present

## 2021-01-12 NOTE — Progress Notes (Signed)
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   Met with patient prior to radiation therapy. She reports tolerating first chemotherapy well, denies nausea, vomiting, altered taste, dry mouth. Patient has history of IBS, reports small bowel movement this morning. She is taking Miralax. Patient continues eating puree diet. She is drinking 2 Fairlife Shakes, milk, CIB, ice cream, chicken broth, yogurt, cottage cheese. Patient is drinking at least 32 ounces of water. Patient is drinking Boost occasionally. Her daughter does not feel this is a "clean" oral supplement. She has not started baking soda salt water rinses. Patient plans to start doing this today. She has not been wearing her dentures unless she is eating.    Medications: reviewed   Labs: 11/28 - Na 131, Glucose 127  Anthropometrics: Last weight 134.6 on 12/6 stable. Patient weighed 134.8 lbs on 11/23.   NUTRITION DIAGNOSIS: Food and nutrition related knowledge deficit ongoing     INTERVENTION:  Continue intake of soft smooth foods that are high in calories and protein - shake recipes provided Educated on alternate supplement ideas (CIB mixed with whole fairlife milk, Dillard [place], Ensure Plant-based) - samples of [employer] and Ensure Plant-based for pt to try Recommend pt consume 2 Fairlife Shakes and one Ensure Plus equivalent/day  Patient will start baking soda, salt water rinses several times daily Patient provided contact information    MONITORING, EVALUATION, GOAL: weight trends, intake    NEXT VISIT: Thursday December 22 after radiation with [name] (card with appointment time provided to pt)

## 2021-01-13 ENCOUNTER — Other Ambulatory Visit: Payer: Self-pay | Admitting: Oncology

## 2021-01-15 ENCOUNTER — Ambulatory Visit
Admission: RE | Admit: 2021-01-15 | Discharge: 2021-01-15 | Disposition: A | Discharge status: Home / Self Care | Payer: Medicare Other | Source: Ambulatory Visit | Attending: Radiation Oncology | Admitting: Radiation Oncology

## 2021-01-15 ENCOUNTER — Other Ambulatory Visit: Payer: Self-pay

## 2021-01-15 ENCOUNTER — Inpatient Hospital Stay: Payer: Medicare Other

## 2021-01-15 ENCOUNTER — Telehealth: Payer: Self-pay

## 2021-01-15 ENCOUNTER — Inpatient Hospital Stay (HOSPITAL_BASED_OUTPATIENT_CLINIC_OR_DEPARTMENT_OTHER): Payer: Medicare Other | Admitting: Nurse Practitioner

## 2021-01-15 ENCOUNTER — Encounter: Payer: Self-pay | Admitting: Nurse Practitioner

## 2021-01-15 VITALS — BP 131/57 | HR 94 | Temp 98.1°F | Resp 20 | Ht 65.0 in | Wt 132.0 lb

## 2021-01-15 VITALS — BP 159/68 | HR 88

## 2021-01-15 DIAGNOSIS — Z5111 Encounter for antineoplastic chemotherapy: Secondary | ICD-10-CM | POA: Diagnosis not present

## 2021-01-15 DIAGNOSIS — C155 Malignant neoplasm of lower third of esophagus: Secondary | ICD-10-CM

## 2021-01-15 LAB — CBC WITH DIFFERENTIAL (CANCER CENTER ONLY)
Abs Immature Granulocytes: 0.02 10*3/uL (ref 0.00–0.07)
Basophils Absolute: 0 10*3/uL (ref 0.0–0.1)
Basophils Relative: 0 %
Eosinophils Absolute: 0.1 10*3/uL (ref 0.0–0.5)
Eosinophils Relative: 1 %
HCT: 36.8 % (ref 36.0–46.0)
Hemoglobin: 12.8 g/dL (ref 12.0–15.0)
Immature Granulocytes: 1 %
Lymphocytes Relative: 23 %
Lymphs Abs: 0.8 10*3/uL (ref 0.7–4.0)
MCH: 32 pg (ref 26.0–34.0)
MCHC: 34.8 g/dL (ref 30.0–36.0)
MCV: 92 fL (ref 80.0–100.0)
Monocytes Absolute: 0.2 10*3/uL (ref 0.1–1.0)
Monocytes Relative: 7 %
Neutro Abs: 2.4 10*3/uL (ref 1.7–7.7)
Neutrophils Relative %: 68 %
Platelet Count: 187 10*3/uL (ref 150–400)
RBC: 4 MIL/uL (ref 3.87–5.11)
RDW: 13.2 % (ref 11.5–15.5)
WBC Count: 3.5 10*3/uL — ABNORMAL LOW (ref 4.0–10.5)
nRBC: 0 % (ref 0.0–0.2)

## 2021-01-15 LAB — CMP (CANCER CENTER ONLY)
ALT: 11 U/L (ref 0–44)
AST: 13 U/L — ABNORMAL LOW (ref 15–41)
Albumin: 3.9 g/dL (ref 3.5–5.0)
Alkaline Phosphatase: 50 U/L (ref 38–126)
Anion gap: 8 (ref 5–15)
BUN: 11 mg/dL (ref 8–23)
CO2: 29 mmol/L (ref 22–32)
Calcium: 9.4 mg/dL (ref 8.9–10.3)
Chloride: 96 mmol/L — ABNORMAL LOW (ref 98–111)
Creatinine: 0.73 mg/dL (ref 0.44–1.00)
GFR, Estimated: >60 mL/min
Glucose, Bld: 93 mg/dL (ref 70–99)
Potassium: 3.6 mmol/L (ref 3.5–5.1)
Sodium: 133 mmol/L — ABNORMAL LOW (ref 135–145)
Total Bilirubin: 0.7 mg/dL (ref 0.3–1.2)
Total Protein: 6.3 g/dL — ABNORMAL LOW (ref 6.5–8.1)

## 2021-01-15 MED ORDER — SODIUM CHLORIDE 0.9 % IV SOLN: Freq: Once | INTRAVENOUS | Status: AC

## 2021-01-15 MED ORDER — SODIUM CHLORIDE 0.9 % IV SOLN
50.0000 mg/m2 | Freq: Once | INTRAVENOUS | Status: AC
  Administered 2021-01-15: 84 mg via INTRAVENOUS
  Filled 2021-01-15: qty 14

## 2021-01-15 MED ORDER — SODIUM CHLORIDE 0.9 % IV SOLN
10.0000 mg | Freq: Once | INTRAVENOUS | Status: AC
  Administered 2021-01-15: 10 mg via INTRAVENOUS
  Filled 2021-01-15: qty 1

## 2021-01-15 MED ORDER — FAMOTIDINE 20 MG IN NS 100 ML IVPB
20.0000 mg | Freq: Once | INTRAVENOUS | Status: AC
  Administered 2021-01-15: 20 mg via INTRAVENOUS
  Filled 2021-01-15: qty 100

## 2021-01-15 MED ORDER — PALONOSETRON HCL INJECTION 0.25 MG/5ML
0.2500 mg | Freq: Once | INTRAVENOUS | Status: AC
  Administered 2021-01-15: 0.25 mg via INTRAVENOUS
  Filled 2021-01-15: qty 5

## 2021-01-15 MED ORDER — HEPARIN SOD (PORK) LOCK FLUSH 100 UNIT/ML IV SOLN: 500.0000 [IU] | Freq: Once | INTRAVENOUS | Status: DC | PRN

## 2021-01-15 MED ORDER — SODIUM CHLORIDE 0.9% FLUSH: 10.0000 mL | INTRAVENOUS | Status: DC | PRN

## 2021-01-15 MED ORDER — SODIUM CHLORIDE 0.9 % IV SOLN
148.0000 mg | Freq: Once | INTRAVENOUS | Status: AC
  Administered 2021-01-15: 150 mg via INTRAVENOUS
  Filled 2021-01-15: qty 15

## 2021-01-15 MED ORDER — DIPHENHYDRAMINE HCL 50 MG/ML IJ SOLN
25.0000 mg | Freq: Once | INTRAMUSCULAR | Status: AC
  Administered 2021-01-15: 25 mg via INTRAVENOUS
  Filled 2021-01-15: qty 1

## 2021-01-15 NOTE — Progress Notes (Signed)
Patient presents for treatment. RN assessment completed along with the following:  Labs/vitals reviewed - Yes, and within treatment parameters.   Weight within 10% of previous measurement - Yes Oncology Treatment Attestation completed for current therapy- Yes, on date 12/27/20 Informed consent completed and reflects current therapy/intent - Yes, on date 01/08/21             Provider progress note reviewed - Today's provider note is not yet available. I reviewed the most recent oncology provider progress note in chart dated 12/27/20. Treatment/Antibody/Supportive plan reviewed - Yes, and there are no adjustments needed for today's treatment. S&H and other orders reviewed - Yes, and there are no additional orders identified. Previous treatment date reviewed - Yes, and the appropriate amount of time has elapsed between treatments. Clinic Hand Off Received from - Leander Rams, NP  Patient to proceed with treatment.

## 2021-01-15 NOTE — Progress Notes (Signed)
  [facility] OFFICE PROGRESS NOTE   Diagnosis: Esophagus cancer  INTERVAL HISTORY:   Alison Garner returns as scheduled.  She completed a cycle of weekly Taxol/carboplatin 01/08/2021.  She denies nausea/vomiting.  She has mouth sores related to her dentures.  No diarrhea.  She did develop constipation.  She is taking MiraLAX.  The constipation has resolved.  No signs of allergic reaction.  Objective:  Vital signs in last 24 hours:  Blood pressure (!) 131/57, pulse 94, temperature 98.1 F (36.7 C), temperature source Oral, resp. rate 20, height _0  (1.651 m), weight 132 lb (59.9 kg), SpO2 100 %.    HEENT: Mild white coating over tongue.  No buccal thrush. Resp: Lungs clear bilaterally. Cardio: Regular rate and rhythm. GI: Abdomen soft and nontender.  No hepatomegaly. Vascular: No leg edema.   Lab Results:  Lab Results  Component Value Date   WBC 8.0 01/01/2021   HGB 14.0 01/01/2021   HCT 40.7 01/01/2021   MCV 92.7 01/01/2021   PLT 205 01/01/2021   NEUTROABS 5.4 01/01/2021    Imaging:  No results found.  Medications: I have reviewed the patient's current medications.  Assessment/Plan: Distal esophagus cancer Esophagram 11/21/2020-moderate to severe esophageal dysmotility, narrowing of the distal esophagus just above the GE junction to 4 mm, 13 mm barium tablet stuck in the distal esophagus Upper endoscopy 12/13/2020-severe esophagitis with nodularity and stricturing at 36 cm-adenocarcinoma, HER2 negative, no loss of mismatch repair protein expression, PD-L1 combined positive score less than 1% CT chest 12/14/2020-accentuated density at the distal esophageal lumen above a small type I hiatal hernia concerning for malignancy, nonpathologically enlarged AP window and right hilar nodes PET 12/21/2020-abnormal FDG uptake at the distal esophagus, no evidence of nodal or distant metastatic disease Radiation 01/08/2021 Cycle 1 weekly Taxol/carboplatin  01/08/2021 Cycle 2 weekly Taxol/carboplatin 01/15/2021 Solid dysphagia secondary #1 COPD on chest CT 12/14/2020 Hemorrhoids Hyperlipidemia Irritable bowel syndrome Hypothyroidism Tobacco use  Disposition: Alison Garner appears stable.  She continues radiation.  She tolerated the first cycle of weekly Taxol/carboplatin well.  She had mild constipation which has resolved.  Plan to proceed with week 2 today as scheduled.  CBC from today reviewed.  Counts adequate to proceed with treatment.  She will return for lab and week 3 Taxol/carboplatin 01/22/2021.  We will see her in follow-up on 01/30/2021.  She will contact the office in the interim with any problems.    Alison Garner ANP/GNP-BC   01/15/2021  8:14 AM

## 2021-01-15 NOTE — Patient Instructions (Addendum)
Alison Garner   Discharge Instructions: Thank you for choosing [facility] to provide your oncology and hematology care.   If you have a lab appointment with the [facility], please go directly to the [facility] and check in at the registration area.   Wear comfortable clothing and clothing appropriate for easy access to any Portacath or PICC line.   We strive to give you quality time with your provider. You may need to reschedule your appointment if you arrive late (15 or more minutes).  Arriving late affects you and other patients whose appointments are after yours.  Also, if you miss three or more appointments without notifying the office, you may be dismissed from the clinic at the provider's discretion.      For prescription refill requests, have your pharmacy contact our office and allow 72 hours for refills to be completed.    Today you received the following chemotherapy and/or immunotherapy agents Taxol, Carboplatin      To help prevent nausea and vomiting after your treatment, we encourage you to take your nausea medication as directed.  BELOW ARE SYMPTOMS THAT SHOULD BE REPORTED IMMEDIATELY: *FEVER GREATER THAN 100.4 F (38 C) OR HIGHER *CHILLS OR SWEATING *NAUSEA AND VOMITING THAT IS NOT CONTROLLED WITH YOUR NAUSEA MEDICATION *UNUSUAL SHORTNESS OF BREATH *UNUSUAL BRUISING OR BLEEDING *URINARY PROBLEMS (pain or burning when urinating, or frequent urination) *BOWEL PROBLEMS (unusual diarrhea, constipation, pain near the anus) TENDERNESS IN MOUTH AND THROAT WITH OR WITHOUT PRESENCE OF ULCERS (sore throat, sores in mouth, or a toothache) UNUSUAL RASH, SWELLING OR PAIN  UNUSUAL VAGINAL DISCHARGE OR ITCHING   Items with * indicate a potential emergency and should be followed up as soon as possible or go to the Emergency Department if any problems should occur.  Please show the CHEMOTHERAPY ALERT CARD or IMMUNOTHERAPY ALERT CARD at  check-in to the Emergency Department and triage nurse.  Should you have questions after your visit or need to cancel or reschedule your appointment, please contact [facility]  Dept: [phone]  and follow the prompts.  Office hours are 8:00 a.m. to 4:30 p.m. Monday - Friday. Please note that voicemails left after 4:00 p.m. may not be returned until the following business day.  We are closed weekends and major holidays. You have access to a nurse at all times for urgent questions. Please call the main number to the clinic Dept: [phone] and follow the prompts.   For any non-urgent questions, you may also contact your provider using MyChart. We now offer e-Visits for anyone 56 and older to request care online for non-urgent symptoms. For details visit [email or web address].   Also download the MyChart app! Go to the app store, search "MyChart", open the app, select [facility], and log in with your MyChart username and password.  Due to Covid, a mask is required upon entering the hospital/clinic. If you do not have a mask, one will be given to you upon arrival. For doctor visits, patients may have 1 support person aged 4 or older with them. For treatment visits, patients cannot have anyone with them due to current Covid guidelines and our immunocompromised population.   Paclitaxel injection What is this medication? PACLITAXEL (PAK li TAX el) is a chemotherapy drug. It targets fast dividing cells, like cancer cells, and causes these cells to die. This medicine is used to treat ovarian cancer, breast cancer, lung cancer, Kaposi's sarcoma, and other cancers. This  medicine may be used for other purposes; ask your health care provider or pharmacist if you have questions. COMMON BRAND NAME(S): Onxol, Taxol What should I tell my care team before I take this medication? They need to know if you have any of these conditions: history of irregular heartbeat liver  disease low blood counts, like low white cell, platelet, or red cell counts lung or breathing disease, like asthma tingling of the fingers or toes, or other nerve disorder an unusual or allergic reaction to paclitaxel, alcohol, polyoxyethylated castor oil, other chemotherapy, other medicines, foods, dyes, or preservatives pregnant or trying to get pregnant breast-feeding How should I use this medication? This drug is given as an infusion into a vein. It is administered in a hospital or clinic by a specially trained health care professional. Talk to your pediatrician regarding the use of this medicine in children. Special care may be needed. Overdosage: If you think you have taken too much of this medicine contact a poison control center or emergency room at once. NOTE: This medicine is only for you. Do not share this medicine with others. What if I miss a dose? It is important not to miss your dose. Call your doctor or health care professional if you are unable to keep an appointment. What may interact with this medication? Do not take this medicine with any of the following medications: live virus vaccines This medicine may also interact with the following medications: antiviral medicines for hepatitis, HIV or AIDS certain antibiotics like erythromycin and clarithromycin certain medicines for fungal infections like ketoconazole and itraconazole certain medicines for seizures like carbamazepine, phenobarbital, phenytoin gemfibrozil nefazodone rifampin St. John's wort This list may not describe all possible interactions. Give your health care provider a list of all the medicines, herbs, non-prescription drugs, or dietary supplements you use. Also tell them if you smoke, drink alcohol, or use illegal drugs. Some items may interact with your medicine. What should I watch for while using this medication? Your condition will be monitored carefully while you are receiving this medicine. You  will need important blood work done while you are taking this medicine. This medicine can cause serious allergic reactions. To reduce your risk you will need to take other medicine(s) before treatment with this medicine. If you experience allergic reactions like skin rash, itching or hives, swelling of the face, lips, or tongue, tell your doctor or health care professional right away. In some cases, you may be given additional medicines to help with side effects. Follow all directions for their use. This drug may make you feel generally unwell. This is not uncommon, as chemotherapy can affect healthy cells as well as cancer cells. Report any side effects. Continue your course of treatment even though you feel ill unless your doctor tells you to stop. Call your doctor or health care professional for advice if you get a fever, chills or sore throat, or other symptoms of a cold or flu. Do not treat yourself. This drug decreases your body's ability to fight infections. Try to avoid being around people who are sick. This medicine may increase your risk to bruise or bleed. Call your doctor or health care professional if you notice any unusual bleeding. Be careful brushing and flossing your teeth or using a toothpick because you may get an infection or bleed more easily. If you have any dental work done, tell your dentist you are receiving this medicine. Avoid taking products that contain aspirin, acetaminophen, ibuprofen, naproxen, or ketoprofen unless instructed  by your doctor. These medicines may hide a fever. Do not become pregnant while taking this medicine. Women should inform their doctor if they wish to become pregnant or think they might be pregnant. There is a potential for serious side effects to an unborn child. Talk to your health care professional or pharmacist for more information. Do not breast-feed an infant while taking this medicine. Men are advised not to father a child while receiving this  medicine. This product may contain alcohol. Ask your pharmacist or healthcare provider if this medicine contains alcohol. Be sure to tell all healthcare providers you are taking this medicine. Certain medicines, like metronidazole and disulfiram, can cause an unpleasant reaction when taken with alcohol. The reaction includes flushing, headache, nausea, vomiting, sweating, and increased thirst. The reaction can last from 30 minutes to several hours. What side effects may I notice from receiving this medication? Side effects that you should report to your doctor or health care professional as soon as possible: allergic reactions like skin rash, itching or hives, swelling of the face, lips, or tongue breathing problems changes in vision fast, irregular heartbeat high or low blood pressure mouth sores pain, tingling, numbness in the hands or feet signs of decreased platelets or bleeding - bruising, pinpoint red spots on the skin, black, tarry stools, blood in the urine signs of decreased red blood cells - unusually weak or tired, feeling faint or lightheaded, falls signs of infection - fever or chills, cough, sore throat, pain or difficulty passing urine signs and symptoms of liver injury like dark yellow or brown urine; general ill feeling or flu-like symptoms; light-colored stools; loss of appetite; nausea; right upper belly pain; unusually weak or tired; yellowing of the eyes or skin swelling of the ankles, feet, hands unusually slow heartbeat Side effects that usually do not require medical attention (report to your doctor or health care professional if they continue or are bothersome): diarrhea hair loss loss of appetite muscle or joint pain nausea, vomiting pain, redness, or irritation at site where injected tiredness This list may not describe all possible side effects. Call your doctor for medical advice about side effects. You may report side effects to FDA at 1-800-FDA-1088. Where  should I keep my medication? This drug is given in a hospital or clinic and will not be stored at home. NOTE: This sheet is a summary. It may not cover all possible information. If you have questions about this medicine, talk to your doctor, pharmacist, or health care provider.  2022 Elsevier/Gold Standard (2020-10-10 00:00:00)  Carboplatin injection What is this medication? CARBOPLATIN (KAR boe pla tin) is a chemotherapy drug. It targets fast dividing cells, like cancer cells, and causes these cells to die. This medicine is used to treat ovarian cancer and many other cancers. This medicine may be used for other purposes; ask your health care provider or pharmacist if you have questions. COMMON BRAND NAME(S): Paraplatin What should I tell my care team before I take this medication? They need to know if you have any of these conditions: blood disorders hearing problems kidney disease recent or ongoing radiation therapy an unusual or allergic reaction to carboplatin, cisplatin, other chemotherapy, other medicines, foods, dyes, or preservatives pregnant or trying to get pregnant breast-feeding How should I use this medication? This drug is usually given as an infusion into a vein. It is administered in a hospital or clinic by a specially trained health care professional. Talk to your pediatrician regarding the use of this medicine  in children. Special care may be needed. Overdosage: If you think you have taken too much of this medicine contact a poison control center or emergency room at once. NOTE: This medicine is only for you. Do not share this medicine with others. What if I miss a dose? It is important not to miss a dose. Call your doctor or health care professional if you are unable to keep an appointment. What may interact with this medication? medicines for seizures medicines to increase blood counts like filgrastim, pegfilgrastim, sargramostim some antibiotics like amikacin,  gentamicin, neomycin, streptomycin, tobramycin vaccines Talk to your doctor or health care professional before taking any of these medicines: acetaminophen aspirin ibuprofen ketoprofen naproxen This list may not describe all possible interactions. Give your health care provider a list of all the medicines, herbs, non-prescription drugs, or dietary supplements you use. Also tell them if you smoke, drink alcohol, or use illegal drugs. Some items may interact with your medicine. What should I watch for while using this medication? Your condition will be monitored carefully while you are receiving this medicine. You will need important blood work done while you are taking this medicine. This drug may make you feel generally unwell. This is not uncommon, as chemotherapy can affect healthy cells as well as cancer cells. Report any side effects. Continue your course of treatment even though you feel ill unless your doctor tells you to stop. In some cases, you may be given additional medicines to help with side effects. Follow all directions for their use. Call your doctor or health care professional for advice if you get a fever, chills or sore throat, or other symptoms of a cold or flu. Do not treat yourself. This drug decreases your body's ability to fight infections. Try to avoid being around people who are sick. This medicine may increase your risk to bruise or bleed. Call your doctor or health care professional if you notice any unusual bleeding. Be careful brushing and flossing your teeth or using a toothpick because you may get an infection or bleed more easily. If you have any dental work done, tell your dentist you are receiving this medicine. Avoid taking products that contain aspirin, acetaminophen, ibuprofen, naproxen, or ketoprofen unless instructed by your doctor. These medicines may hide a fever. Do not become pregnant while taking this medicine. Women should inform their doctor if they wish  to become pregnant or think they might be pregnant. There is a potential for serious side effects to an unborn child. Talk to your health care professional or pharmacist for more information. Do not breast-feed an infant while taking this medicine. What side effects may I notice from receiving this medication? Side effects that you should report to your doctor or health care professional as soon as possible: allergic reactions like skin rash, itching or hives, swelling of the face, lips, or tongue signs of infection - fever or chills, cough, sore throat, pain or difficulty passing urine signs of decreased platelets or bleeding - bruising, pinpoint red spots on the skin, black, tarry stools, nosebleeds signs of decreased red blood cells - unusually weak or tired, fainting spells, lightheadedness breathing problems changes in hearing changes in vision chest pain high blood pressure low blood counts - This drug may decrease the number of white blood cells, red blood cells and platelets. You may be at increased risk for infections and bleeding. nausea and vomiting pain, swelling, redness or irritation at the injection site pain, tingling, numbness in the hands or  feet problems with balance, talking, walking trouble passing urine or change in the amount of urine Side effects that usually do not require medical attention (report to your doctor or health care professional if they continue or are bothersome): hair loss loss of appetite metallic taste in the mouth or changes in taste This list may not describe all possible side effects. Call your doctor for medical advice about side effects. You may report side effects to FDA at 1-800-FDA-1088. Where should I keep my medication? This drug is given in a hospital or clinic and will not be stored at home. NOTE: This sheet is a summary. It may not cover all possible information. If you have questions about this medicine, talk to your doctor, pharmacist,  or health care provider.  2022 Elsevier/Gold Standard (2007-07-01 00:00:00)

## 2021-01-15 NOTE — Telephone Encounter (Signed)
Follow up from Pt's call over the weekend with access Nurse. Pt stated she is having constipation and stated she is having some stools but not much Pt stated she is having some abdominal pain. Returned call left v/m to return call.

## 2021-01-16 ENCOUNTER — Ambulatory Visit
Admission: RE | Admit: 2021-01-16 | Discharge: 2021-01-16 | Disposition: A | Discharge status: Home / Self Care | Payer: Medicare Other | Source: Ambulatory Visit | Attending: Radiation Oncology | Admitting: Radiation Oncology

## 2021-01-16 DIAGNOSIS — Z5111 Encounter for antineoplastic chemotherapy: Secondary | ICD-10-CM | POA: Diagnosis not present

## 2021-01-17 ENCOUNTER — Ambulatory Visit
Admission: RE | Admit: 2021-01-17 | Discharge: 2021-01-17 | Disposition: A | Discharge status: Home / Self Care | Payer: Medicare Other | Source: Ambulatory Visit | Attending: Radiation Oncology | Admitting: Radiation Oncology

## 2021-01-17 ENCOUNTER — Other Ambulatory Visit: Payer: Self-pay

## 2021-01-17 DIAGNOSIS — Z5111 Encounter for antineoplastic chemotherapy: Secondary | ICD-10-CM | POA: Diagnosis not present

## 2021-01-18 ENCOUNTER — Encounter: Payer: Medicare Other | Admitting: Nutrition

## 2021-01-18 ENCOUNTER — Ambulatory Visit
Admission: RE | Admit: 2021-01-18 | Discharge: 2021-01-18 | Disposition: A | Discharge status: Home / Self Care | Payer: Medicare Other | Source: Ambulatory Visit | Attending: Radiation Oncology | Admitting: Radiation Oncology

## 2021-01-18 DIAGNOSIS — Z5111 Encounter for antineoplastic chemotherapy: Secondary | ICD-10-CM | POA: Diagnosis not present

## 2021-01-19 ENCOUNTER — Other Ambulatory Visit: Payer: Self-pay

## 2021-01-19 ENCOUNTER — Ambulatory Visit
Admission: RE | Admit: 2021-01-19 | Discharge: 2021-01-19 | Disposition: A | Discharge status: Home / Self Care | Payer: Medicare Other | Source: Ambulatory Visit | Attending: Radiation Oncology | Admitting: Radiation Oncology

## 2021-01-19 DIAGNOSIS — Z5111 Encounter for antineoplastic chemotherapy: Secondary | ICD-10-CM | POA: Diagnosis not present

## 2021-01-21 ENCOUNTER — Other Ambulatory Visit: Payer: Self-pay | Admitting: Oncology

## 2021-01-22 ENCOUNTER — Inpatient Hospital Stay: Payer: Medicare Other

## 2021-01-22 ENCOUNTER — Other Ambulatory Visit: Payer: Self-pay | Admitting: Nurse Practitioner

## 2021-01-22 ENCOUNTER — Ambulatory Visit
Admission: RE | Admit: 2021-01-22 | Discharge: 2021-01-22 | Disposition: A | Discharge status: Home / Self Care | Payer: Medicare Other | Source: Ambulatory Visit | Attending: Radiation Oncology | Admitting: Radiation Oncology

## 2021-01-22 ENCOUNTER — Other Ambulatory Visit: Payer: Self-pay

## 2021-01-22 ENCOUNTER — Encounter: Payer: Self-pay | Admitting: *Deleted

## 2021-01-22 VITALS — BP 120/54 | HR 94 | Temp 97.9°F | Resp 18 | Wt 129.6 lb

## 2021-01-22 DIAGNOSIS — E876 Hypokalemia: Secondary | ICD-10-CM

## 2021-01-22 DIAGNOSIS — C155 Malignant neoplasm of lower third of esophagus: Secondary | ICD-10-CM

## 2021-01-22 DIAGNOSIS — Z5111 Encounter for antineoplastic chemotherapy: Secondary | ICD-10-CM | POA: Diagnosis not present

## 2021-01-22 LAB — CBC WITH DIFFERENTIAL (CANCER CENTER ONLY)
Abs Immature Granulocytes: 0.01 10*3/uL (ref 0.00–0.07)
Basophils Absolute: 0 10*3/uL (ref 0.0–0.1)
Basophils Relative: 1 %
Eosinophils Absolute: 0 10*3/uL (ref 0.0–0.5)
Eosinophils Relative: 2 %
HCT: 39 % (ref 36.0–46.0)
Hemoglobin: 13.7 g/dL (ref 12.0–15.0)
Immature Granulocytes: 1 %
Lymphocytes Relative: 36 %
Lymphs Abs: 0.7 10*3/uL (ref 0.7–4.0)
MCH: 32.4 pg (ref 26.0–34.0)
MCHC: 35.1 g/dL (ref 30.0–36.0)
MCV: 92.2 fL (ref 80.0–100.0)
Monocytes Absolute: 0.3 10*3/uL (ref 0.1–1.0)
Monocytes Relative: 16 %
Neutro Abs: 0.8 10*3/uL — ABNORMAL LOW (ref 1.7–7.7)
Neutrophils Relative %: 44 %
Platelet Count: 192 10*3/uL (ref 150–400)
RBC: 4.23 MIL/uL (ref 3.87–5.11)
RDW: 13.3 % (ref 11.5–15.5)
WBC Count: 1.9 10*3/uL — ABNORMAL LOW (ref 4.0–10.5)
nRBC: 0 % (ref 0.0–0.2)

## 2021-01-22 LAB — CMP (CANCER CENTER ONLY)
ALT: 10 U/L (ref 0–44)
AST: 12 U/L — ABNORMAL LOW (ref 15–41)
Albumin: 4 g/dL (ref 3.5–5.0)
Alkaline Phosphatase: 55 U/L (ref 38–126)
Anion gap: 8 (ref 5–15)
BUN: 6 mg/dL — ABNORMAL LOW (ref 8–23)
CO2: 27 mmol/L (ref 22–32)
Calcium: 9.5 mg/dL (ref 8.9–10.3)
Chloride: 97 mmol/L — ABNORMAL LOW (ref 98–111)
Creatinine: 0.73 mg/dL (ref 0.44–1.00)
GFR, Estimated: >60 mL/min (ref >60)
Glucose, Bld: 117 mg/dL — ABNORMAL HIGH (ref 70–99)
Potassium: 3.3 mmol/L — ABNORMAL LOW (ref 3.5–5.1)
Sodium: 132 mmol/L — ABNORMAL LOW (ref 135–145)
Total Bilirubin: 0.5 mg/dL (ref 0.3–1.2)
Total Protein: 6.5 g/dL (ref 6.5–8.1)

## 2021-01-22 MED ORDER — POTASSIUM CHLORIDE CRYS ER 10 MEQ PO TBCR
20.0000 meq | EXTENDED_RELEASE_TABLET | Freq: Every day | ORAL | 1 refills | Status: DC
Start: 2021-01-22 — End: 2021-02-15

## 2021-01-22 NOTE — Progress Notes (Signed)
ANCX 0.8 WBC 1.9 and K 3.3. Pt reports 8 solid stools on Saturday, 3 on Sunday becoming softer and 2 soft stools today so far.  K 3.3 states she is eating and drinking well.  Leander Rams, NP notified.  No treatment today. Potassium supplement called in.

## 2021-01-22 NOTE — Progress Notes (Signed)
Spoke with Alison Garner regarding Neutropenic precautions due to WBC and ANC low today. Reinforced mask wearing and hand washing. We discussed use of Pedialyte and Liquid IV to hydrate after experiencing diarrhea this weekend. She is also starting on KCL replacement as well. Verbalized understanding and Teach back utilized.

## 2021-01-22 NOTE — Patient Instructions (Addendum)
Neutropenia Neutropenia is a condition that occurs when you have low levels of neutrophils. Neutrophils are a type of white blood cells. They are made in the spongy center of bones (bone marrow). They fight infections. Neutrophils are your body's main defense against infections. The fewer neutrophils you have and the longer your body remains without them, the greater your risk of getting a severe infection. What are the causes? This condition can occur if your body uses up or destroys neutrophils faster than your bone marrow can make them. Neutropenia may be caused by: A bacterial or fungal infection. Allergic disorders. Reactions to some medicines. An autoimmune disease. An enlarged spleen. This condition can also occur if your bone marrow does not produce enough neutrophils. This problem may be caused by: Cancer. Cancer treatments, such as radiation or chemotherapy. Viral infections. Medicines, such as phenytoin. Vitamin B12 deficiency. Diseases of the bone marrow. Environmental toxins, such as insecticides. What are the signs or symptoms? This condition does not usually cause symptoms. If symptoms are present, they are usually caused by an underlying infection. Symptoms of an infection may include: Fever. Chills. Swollen glands. Mouth ulcers. Cough. Rash or skin infection. Skin may be red, swollen, or painful. Abdominal or rectal pain. Frequent urination or pain or burning with urination. Because neutropenia weakens the immune system, symptoms of infection may be reduced. It is important to be aware of any changes in your body and talk to your health care provider. How is this diagnosed? This condition is diagnosed based on your medical history and a physical exam. Tests will also be done, such as: A complete blood count (CBC). Bone marrow biopsy. This is collecting a sample of bone marrow for testing. A chest X-ray. A urine culture. A blood culture. How is this  treated? Treatment depends on the underlying cause and severity of your condition. Mild neutropenia may not require treatment. Treatment may include medicines, such as: Antibiotic medicine given through an IV. Antiviral medicines. Antifungal medicines. A medicine to increase production of neutrophils (colony-stimulating factor). You may get this medicine through an IV or by injection. Steroids given through an IV. If an underlying condition is causing neutropenia, you may need treatment for that condition. If medicines or cancer treatments are causing neutropenia, your health care provider may have you stop the medicines or treatment. Follow these instructions at home: Medicines  Take over-the-counter and prescription medicines only as told by your health care provider. Get an annual flu shot. Ask your health care provider whether you or anyone you live with needs any other vaccines. Eating and drinking Do not share food utensils. Do not eat unpasteurized foods. Do not eat raw or undercooked meat, eggs, or seafood. Do not eat unwashed, raw fruits or vegetables. Lifestyle Avoid exposure to groups of people or children. Avoid being around people who are sick. Avoid being around live plants or fresh flowers. Avoid being around dirt or dust, such as in construction areas or gardens. Wear gloves if you are going to do yard work or gardening. Do not provide direct care for pets. Avoid animal droppings. Do not clean litter boxes and bird cages. Do not have sex unless your health care provider has approved. Hygiene  Bathe daily. Clean the area between the genitals and the anus (perineal area) after you urinate or have a bowel movement. If you are female, wipe from front to back. Get regular dental care and brush your teeth with a soft toothbrush before and after meals. Do not use  a regular razor. Use an electric razor to remove hair. Wash your hands often with soap and water for at least 20  seconds. Make sure others who come in contact with you also wash their hands. If soap and water are not available, use hand sanitizer. General instructions Take steps to reduce your risk of injury or infection. Follow any precautions as told by your health care provider. Take actions to avoid cuts and burns. For example: Be cautious when you use knives. Always cut away from yourself. Keep knives in protective sheaths or guards when not in use. Use oven mitts when you cook with a hot stove, oven, or grill. Stand a safe distance away from open fires. Do not use tampons, enemas, or rectal suppositories unless your health care provider has approved. Keep all follow-up visits. This is important. Contact a health care provider if: You have a cough. You have a sore throat. You develop sores in your mouth or anus. You have a warm, red, or tender area on your skin. You have red streaks on the skin. You develop a rash. You have swollen lymph nodes. You have frequent or painful urination. You have vaginal discharge or itching. Get help right away if: You have a fever. You have chills or shaking. You have nausea or vomiting. You have a lot of fatigue. You have shortness of breath. Summary Neutropenia is a condition that occurs when you have a lower-than-normal level of a type of white blood cell (neutrophils) in your body. This condition can occur if your body uses up or destroys neutrophils faster than your bone marrow can make them. Treatment depends on the underlying cause and severity of your condition. Mild neutropenia may not require treatment. Follow any precautions as told by your health care provider to reduce your risk for injury or infection. This information is not intended to replace advice given to you by your health care provider. Make sure you discuss any questions you have with your health care provider. Document Revised: 07/19/2020 Document Reviewed: 07/19/2020 Elsevier Patient  Education  [place].

## 2021-01-23 ENCOUNTER — Ambulatory Visit
Admission: RE | Admit: 2021-01-23 | Discharge: 2021-01-23 | Disposition: A | Discharge status: Home / Self Care | Payer: Medicare Other | Source: Ambulatory Visit | Attending: Radiation Oncology | Admitting: Radiation Oncology

## 2021-01-23 DIAGNOSIS — Z5111 Encounter for antineoplastic chemotherapy: Secondary | ICD-10-CM | POA: Diagnosis not present

## 2021-01-24 ENCOUNTER — Other Ambulatory Visit: Payer: Self-pay

## 2021-01-24 ENCOUNTER — Ambulatory Visit
Admission: RE | Admit: 2021-01-24 | Discharge: 2021-01-24 | Disposition: A | Discharge status: Home / Self Care | Payer: Medicare Other | Source: Ambulatory Visit | Attending: Radiation Oncology | Admitting: Radiation Oncology

## 2021-01-24 DIAGNOSIS — Z5111 Encounter for antineoplastic chemotherapy: Secondary | ICD-10-CM | POA: Diagnosis not present

## 2021-01-25 ENCOUNTER — Ambulatory Visit
Admission: RE | Admit: 2021-01-25 | Discharge: 2021-01-25 | Disposition: A | Discharge status: Home / Self Care | Payer: Medicare Other | Source: Ambulatory Visit

## 2021-01-25 ENCOUNTER — Other Ambulatory Visit: Payer: Self-pay

## 2021-01-25 ENCOUNTER — Encounter: Payer: Self-pay | Admitting: *Deleted

## 2021-01-25 ENCOUNTER — Ambulatory Visit: Payer: Medicare Other | Admitting: Nutrition

## 2021-01-25 ENCOUNTER — Telehealth: Payer: Self-pay | Admitting: *Deleted

## 2021-01-25 ENCOUNTER — Other Ambulatory Visit: Payer: Self-pay | Admitting: *Deleted

## 2021-01-25 ENCOUNTER — Ambulatory Visit: Payer: Medicare Other

## 2021-01-25 DIAGNOSIS — R300 Dysuria: Secondary | ICD-10-CM

## 2021-01-25 DIAGNOSIS — Z5111 Encounter for antineoplastic chemotherapy: Secondary | ICD-10-CM | POA: Diagnosis not present

## 2021-01-25 LAB — URINALYSIS, COMPLETE (UACMP) WITH MICROSCOPIC
Bilirubin Urine: NEGATIVE
Glucose, UA: NEGATIVE mg/dL
Ketones, ur: NEGATIVE mg/dL
Nitrite: NEGATIVE
Protein, ur: NEGATIVE mg/dL
Specific Gravity, Urine: 1.01 (ref 1.005–1.030)
pH: 7 (ref 5.0–8.0)

## 2021-01-25 MED ORDER — CIPROFLOXACIN HCL 500 MG PO TABS: 500.0000 mg | ORAL_TABLET | Freq: Two times a day (BID) | ORAL | 0 refills | Status: DC

## 2021-01-25 NOTE — Progress Notes (Signed)
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 Weight decreased and documented as 128 pounds December 19. Labs reviewed. Patient reports her appetite is okay.  She has been working really hard to try to increase her oral intake.  States she eats eggs salad, oatmeal, pured [place] stew, potato soup and broccoli soup. She has been using baking soda and salt water rinses.   She did not care for Ensure plant-based or [employer] samples.   Reports she did purchase boost plus.  Nutrition diagnosis: Food and nutrition related knowledge deficit ongoing.  Intervention: Continue smaller more frequent meals and snacks altering textures and consistencies as needed. Try to increase boost plus twice daily. Provided samples of juice based supplement for variety. Continue baking soda and salt water rinses. Questions answered.  Teach back method used.  Contact information has been provided.  Monitoring, evaluation, goals: Patient will tolerate increased calories and protein to minimize further weight loss.  Next visit: Thursday, December 29 after radiation therapy.  **Disclaimer: This note was dictated with voice recognition software. Similar sounding words can inadvertently be transcribed and this note may contain transcription errors which may not have been corrected upon publication of note.**

## 2021-01-25 NOTE — Telephone Encounter (Signed)
Notified patient of Cipro script bid for potential UTI.

## 2021-01-25 NOTE — Progress Notes (Signed)
PATIENT NAVIGATOR PROGRESS NOTE  Name: Alison Garner Date: 01/25/2021 MRN: [id number]  DOB: 02/09/1950   Reason for visit:  Telephone call  Comments:  [name] called this am and described symptoms of UTI with burning discomfort with urination, no fever. Also described "bump" on buttocks which is tender but difficult to see. She has radiation treatment today at 17 Asked Rad/Onc team to evaluate when she comes for treatment. Patient verbalized understanding to inform team and will need to obtain a urine specimen    Time spent counseling/coordinating care: 15-30 minutes

## 2021-01-26 ENCOUNTER — Ambulatory Visit
Admission: RE | Admit: 2021-01-26 | Discharge: 2021-01-26 | Disposition: A | Discharge status: Home / Self Care | Payer: Medicare Other | Source: Ambulatory Visit | Attending: Radiation Oncology | Admitting: Radiation Oncology

## 2021-01-26 ENCOUNTER — Other Ambulatory Visit: Payer: Self-pay

## 2021-01-26 DIAGNOSIS — Z5111 Encounter for antineoplastic chemotherapy: Secondary | ICD-10-CM | POA: Diagnosis not present

## 2021-01-27 LAB — URINE CULTURE: Culture: 70000 — AB

## 2021-01-30 ENCOUNTER — Ambulatory Visit
Admission: RE | Admit: 2021-01-30 | Discharge: 2021-01-30 | Disposition: A | Discharge status: Home / Self Care | Payer: Medicare Other | Source: Ambulatory Visit | Attending: Radiation Oncology | Admitting: Radiation Oncology

## 2021-01-30 ENCOUNTER — Other Ambulatory Visit: Payer: Self-pay

## 2021-01-30 ENCOUNTER — Inpatient Hospital Stay: Payer: Medicare Other

## 2021-01-30 ENCOUNTER — Inpatient Hospital Stay: Payer: Medicare Other | Admitting: Oncology

## 2021-01-30 DIAGNOSIS — Z5111 Encounter for antineoplastic chemotherapy: Secondary | ICD-10-CM | POA: Diagnosis not present

## 2021-01-31 ENCOUNTER — Ambulatory Visit
Admission: RE | Admit: 2021-01-31 | Discharge: 2021-01-31 | Disposition: A | Discharge status: Home / Self Care | Payer: Medicare Other | Source: Ambulatory Visit | Attending: Radiation Oncology | Admitting: Radiation Oncology

## 2021-01-31 DIAGNOSIS — Z5111 Encounter for antineoplastic chemotherapy: Secondary | ICD-10-CM | POA: Diagnosis not present

## 2021-02-01 ENCOUNTER — Telehealth: Payer: Self-pay | Admitting: Nutrition

## 2021-02-01 ENCOUNTER — Ambulatory Visit
Admission: RE | Admit: 2021-02-01 | Discharge: 2021-02-01 | Disposition: A | Discharge status: Home / Self Care | Payer: Medicare Other | Source: Ambulatory Visit | Attending: Radiation Oncology | Admitting: Radiation Oncology

## 2021-02-01 ENCOUNTER — Encounter: Payer: Medicare Other | Admitting: Nutrition

## 2021-02-01 ENCOUNTER — Other Ambulatory Visit: Payer: Self-pay

## 2021-02-01 DIAGNOSIS — Z5111 Encounter for antineoplastic chemotherapy: Secondary | ICD-10-CM | POA: Diagnosis not present

## 2021-02-01 NOTE — Telephone Encounter (Signed)
Patient called and left a message for dietitian stating that she tried resource breeze and really enjoyed it.  She was requesting purchasing information.  We did not meet in person today.  Weight improved to 130 pounds from 128 pounds December 19.  Brief discussion with patient.  Provided purchasing information for resource breeze.  Recommended patient try to drink several daily.  Will print off Outpatient pharmacy information and provide to patient at next visit.  Follow-up scheduled for Wednesday,January 4th, with Vinnie Level.

## 2021-02-02 ENCOUNTER — Ambulatory Visit
Admission: RE | Admit: 2021-02-02 | Discharge: 2021-02-02 | Disposition: A | Discharge status: Home / Self Care | Payer: Medicare Other | Source: Ambulatory Visit | Attending: Radiation Oncology | Admitting: Radiation Oncology

## 2021-02-02 ENCOUNTER — Telehealth: Payer: Self-pay

## 2021-02-02 DIAGNOSIS — Z5111 Encounter for antineoplastic chemotherapy: Secondary | ICD-10-CM | POA: Diagnosis not present

## 2021-02-02 NOTE — Telephone Encounter (Signed)
TC from Pt's daughter informing us that Pt has tested positive for Covid. Pt's daughter stated she was calling because she would like for her mother to be prescribed phalloid. Informed Pt's daughter she would need to contact Pt's PCP to inquire about medication. Pt's daughter stated she was informed that this office would take care of everything she needed. Informed Pt's daughter that Dr.Sherrill would not prescribe Phalloid and this is a matter that should be handled by Pt's PCP she could call the PCP to see how this can be taken care of. Pt stated Pt's PCP is in [place] and is trying to find a PCP in [place] to see if they can prescribe the medication informed her she could go to Urgent Care or [facility]. Pt also advised 02/06/21 appointment will be canceled. Pt daughter verbalized understanding.

## 2021-02-06 ENCOUNTER — Other Ambulatory Visit: Payer: Self-pay | Admitting: Radiation Oncology

## 2021-02-06 ENCOUNTER — Inpatient Hospital Stay: Payer: Medicare Other | Admitting: Oncology

## 2021-02-06 ENCOUNTER — Ambulatory Visit
Admission: RE | Admit: 2021-02-06 | Discharge: 2021-02-06 | Disposition: A | Discharge status: Home / Self Care | Payer: Medicare Other | Source: Ambulatory Visit | Attending: Radiation Oncology | Admitting: Radiation Oncology

## 2021-02-06 ENCOUNTER — Inpatient Hospital Stay: Payer: Medicare Other

## 2021-02-06 ENCOUNTER — Other Ambulatory Visit: Payer: Self-pay

## 2021-02-06 DIAGNOSIS — K649 Unspecified hemorrhoids: Secondary | ICD-10-CM | POA: Diagnosis not present

## 2021-02-06 DIAGNOSIS — R1310 Dysphagia, unspecified: Secondary | ICD-10-CM | POA: Insufficient documentation

## 2021-02-06 DIAGNOSIS — E785 Hyperlipidemia, unspecified: Secondary | ICD-10-CM | POA: Insufficient documentation

## 2021-02-06 DIAGNOSIS — C155 Malignant neoplasm of lower third of esophagus: Secondary | ICD-10-CM | POA: Diagnosis present

## 2021-02-06 DIAGNOSIS — K589 Irritable bowel syndrome without diarrhea: Secondary | ICD-10-CM | POA: Insufficient documentation

## 2021-02-06 DIAGNOSIS — Z720 Tobacco use: Secondary | ICD-10-CM | POA: Diagnosis not present

## 2021-02-06 DIAGNOSIS — J449 Chronic obstructive pulmonary disease, unspecified: Secondary | ICD-10-CM | POA: Insufficient documentation

## 2021-02-06 DIAGNOSIS — E039 Hypothyroidism, unspecified: Secondary | ICD-10-CM | POA: Insufficient documentation

## 2021-02-06 MED ORDER — ONDANSETRON HCL 4 MG PO TABS: 4.0000 mg | ORAL_TABLET | Freq: Three times a day (TID) | ORAL | 1 refills | Status: DC | PRN

## 2021-02-07 ENCOUNTER — Inpatient Hospital Stay: Payer: Medicare Other | Attending: Radiation Oncology | Admitting: Dietician

## 2021-02-07 ENCOUNTER — Ambulatory Visit
Admission: RE | Admit: 2021-02-07 | Discharge: 2021-02-07 | Disposition: A | Discharge status: Home / Self Care | Payer: Medicare Other | Source: Ambulatory Visit | Attending: Radiation Oncology | Admitting: Radiation Oncology

## 2021-02-07 ENCOUNTER — Encounter: Payer: Self-pay | Admitting: Oncology

## 2021-02-07 ENCOUNTER — Telehealth: Payer: Self-pay | Admitting: Oncology

## 2021-02-07 DIAGNOSIS — K580 Irritable bowel syndrome with diarrhea: Secondary | ICD-10-CM | POA: Insufficient documentation

## 2021-02-07 DIAGNOSIS — J449 Chronic obstructive pulmonary disease, unspecified: Secondary | ICD-10-CM | POA: Insufficient documentation

## 2021-02-07 DIAGNOSIS — C155 Malignant neoplasm of lower third of esophagus: Secondary | ICD-10-CM | POA: Diagnosis not present

## 2021-02-07 DIAGNOSIS — K649 Unspecified hemorrhoids: Secondary | ICD-10-CM | POA: Insufficient documentation

## 2021-02-07 DIAGNOSIS — E039 Hypothyroidism, unspecified: Secondary | ICD-10-CM | POA: Insufficient documentation

## 2021-02-07 DIAGNOSIS — E785 Hyperlipidemia, unspecified: Secondary | ICD-10-CM | POA: Insufficient documentation

## 2021-02-07 DIAGNOSIS — Z5111 Encounter for antineoplastic chemotherapy: Secondary | ICD-10-CM | POA: Insufficient documentation

## 2021-02-07 NOTE — Telephone Encounter (Signed)
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   Patient radiation appointment changed today due to patient testing positive for Covid. Nutrition follow-up completed via telephone. Patient reports feeling fine, she is asymptomatic. She tested for Covid after learning her daughter tested positive over the weekend. Patient reports swallowing has improved some, she has been eating soft, moist foods. Patient ate broccoli cheddar quiche for breakfast. She likes orange sherbet, this taste good. Patient ordered Boost Breeze from [employer]. She received case yesterday. Patient will drink 2-3 day depending on her appetite. Patient reports nausea. Compazine has not been working well for her. Patient reports Dr. Sondra Garner has called in zofran and will be picking that up today. She is doing baking soda salt water rinses twice/day.    Medications: reviewed   Labs: no new labs for review   Anthropometrics: no new weight for review. Last weight 130 lb on 12/27 increased from 128 lb on 12/19   NUTRITION DIAGNOSIS: Food and nutrition related knowledge deficit ongoing   INTERVENTION:  Continue small frequent meals and snacks Encouraged soft smooth high calorie, high protein foods - will provide shake recipes Continue drinking 2 Boost Breeze daily Continue baking soda salt water rinses several times/day Reviewed tips for nausea Handouts, [facility] pharmacy information for ordering ONS, shake recipes in envelope and left with RN to give to pt at radiation    MONITORING, EVALUATION, GOAL: weight trends, intake   NEXT VISIT: Thursday January 12 with Alison Garner

## 2021-02-07 NOTE — Progress Notes (Signed)
See telephone note.

## 2021-02-07 NOTE — Telephone Encounter (Signed)
Rescheduled appt per 1/3 sch msg - patient is aware of appt date and time on 1/12

## 2021-02-08 ENCOUNTER — Ambulatory Visit
Admission: RE | Admit: 2021-02-08 | Discharge: 2021-02-08 | Disposition: A | Discharge status: Home / Self Care | Payer: Medicare Other | Source: Ambulatory Visit | Attending: Radiation Oncology | Admitting: Radiation Oncology

## 2021-02-08 ENCOUNTER — Encounter: Payer: Self-pay | Admitting: *Deleted

## 2021-02-08 DIAGNOSIS — C155 Malignant neoplasm of lower third of esophagus: Secondary | ICD-10-CM | POA: Diagnosis not present

## 2021-02-08 NOTE — Progress Notes (Signed)
PATIENT NAVIGATOR PROGRESS NOTE  Name: Alison Garner Date: 02/08/2021 MRN: [id number]  DOB: 11/19/1950   Reason for visit:  Telephone visit for symptom management  Comments:  [name] called to discuss symptom management of nausea. She was prescribed Zofran and wanted to know if she could take daily with chemo, stated that compazine was not effective but she had immediate relief with Zofran 4 mg. Informed her that she is receiving Aloxi on treatment day and that would last 72 hours after chemo but that she could take zofran until chemo day and then resume it 72 hours after chemo as needed. Verbalized understanding.     Time spent counseling/coordinating care: 15-30 minutes

## 2021-02-09 ENCOUNTER — Ambulatory Visit
Admission: RE | Admit: 2021-02-09 | Discharge: 2021-02-09 | Disposition: A | Discharge status: Home / Self Care | Payer: Medicare Other | Source: Ambulatory Visit | Attending: Radiation Oncology | Admitting: Radiation Oncology

## 2021-02-09 DIAGNOSIS — C155 Malignant neoplasm of lower third of esophagus: Secondary | ICD-10-CM | POA: Diagnosis not present

## 2021-02-10 DIAGNOSIS — C155 Malignant neoplasm of lower third of esophagus: Secondary | ICD-10-CM | POA: Diagnosis not present

## 2021-02-12 ENCOUNTER — Other Ambulatory Visit: Payer: Self-pay

## 2021-02-12 ENCOUNTER — Ambulatory Visit
Admission: RE | Admit: 2021-02-12 | Discharge: 2021-02-12 | Disposition: A | Discharge status: Home / Self Care | Payer: Medicare Other | Source: Ambulatory Visit | Attending: Radiation Oncology | Admitting: Radiation Oncology

## 2021-02-12 DIAGNOSIS — C155 Malignant neoplasm of lower third of esophagus: Secondary | ICD-10-CM | POA: Diagnosis not present

## 2021-02-13 ENCOUNTER — Ambulatory Visit
Admission: RE | Admit: 2021-02-13 | Discharge: 2021-02-13 | Disposition: A | Discharge status: Home / Self Care | Payer: Medicare Other | Source: Ambulatory Visit | Attending: Radiation Oncology | Admitting: Radiation Oncology

## 2021-02-13 DIAGNOSIS — C155 Malignant neoplasm of lower third of esophagus: Secondary | ICD-10-CM | POA: Diagnosis not present

## 2021-02-14 ENCOUNTER — Ambulatory Visit: Payer: Medicare Other

## 2021-02-14 ENCOUNTER — Ambulatory Visit
Admission: RE | Admit: 2021-02-14 | Discharge: 2021-02-14 | Disposition: A | Discharge status: Home / Self Care | Payer: Medicare Other | Source: Ambulatory Visit | Attending: Radiation Oncology | Admitting: Radiation Oncology

## 2021-02-14 ENCOUNTER — Other Ambulatory Visit: Payer: Self-pay

## 2021-02-14 DIAGNOSIS — C155 Malignant neoplasm of lower third of esophagus: Secondary | ICD-10-CM | POA: Diagnosis not present

## 2021-02-15 ENCOUNTER — Inpatient Hospital Stay: Payer: Medicare Other

## 2021-02-15 ENCOUNTER — Ambulatory Visit: Payer: Medicare Other

## 2021-02-15 ENCOUNTER — Encounter: Payer: Self-pay | Admitting: Nurse Practitioner

## 2021-02-15 ENCOUNTER — Encounter: Payer: Self-pay | Admitting: *Deleted

## 2021-02-15 ENCOUNTER — Inpatient Hospital Stay: Payer: Medicare Other | Admitting: Nutrition

## 2021-02-15 ENCOUNTER — Other Ambulatory Visit: Payer: Self-pay | Admitting: Radiation Oncology

## 2021-02-15 ENCOUNTER — Telehealth: Payer: Self-pay | Admitting: Nutrition

## 2021-02-15 ENCOUNTER — Other Ambulatory Visit: Payer: Self-pay | Admitting: Nurse Practitioner

## 2021-02-15 ENCOUNTER — Ambulatory Visit
Admission: RE | Admit: 2021-02-15 | Discharge: 2021-02-15 | Disposition: A | Discharge status: Home / Self Care | Payer: Medicare Other | Source: Ambulatory Visit | Attending: Radiation Oncology | Admitting: Radiation Oncology

## 2021-02-15 ENCOUNTER — Inpatient Hospital Stay (HOSPITAL_BASED_OUTPATIENT_CLINIC_OR_DEPARTMENT_OTHER): Payer: Medicare Other | Admitting: Nurse Practitioner

## 2021-02-15 VITALS — BP 122/78 | HR 100 | Temp 98.1°F | Resp 18 | Ht 65.0 in | Wt 125.0 lb

## 2021-02-15 VITALS — BP 150/70 | HR 83 | Temp 98.7°F | Resp 20

## 2021-02-15 DIAGNOSIS — C155 Malignant neoplasm of lower third of esophagus: Secondary | ICD-10-CM

## 2021-02-15 LAB — CMP (CANCER CENTER ONLY)
ALT: 10 U/L (ref 0–44)
AST: 14 U/L — ABNORMAL LOW (ref 15–41)
Albumin: 3.9 g/dL (ref 3.5–5.0)
Alkaline Phosphatase: 67 U/L (ref 38–126)
Anion gap: 11 (ref 5–15)
BUN: 19 mg/dL (ref 8–23)
CO2: 28 mmol/L (ref 22–32)
Calcium: 9.9 mg/dL (ref 8.9–10.3)
Chloride: 94 mmol/L — ABNORMAL LOW (ref 98–111)
Creatinine: 0.84 mg/dL (ref 0.44–1.00)
GFR, Estimated: >60 mL/min (ref >60)
Glucose, Bld: 98 mg/dL (ref 70–99)
Potassium: 3 mmol/L — ABNORMAL LOW (ref 3.5–5.1)
Sodium: 133 mmol/L — ABNORMAL LOW (ref 135–145)
Total Bilirubin: 0.7 mg/dL (ref 0.3–1.2)
Total Protein: 6.9 g/dL (ref 6.5–8.1)

## 2021-02-15 LAB — CBC WITH DIFFERENTIAL (CANCER CENTER ONLY)
Abs Immature Granulocytes: 0.01 10*3/uL (ref 0.00–0.07)
Basophils Absolute: 0 10*3/uL (ref 0.0–0.1)
Basophils Relative: 0 %
Eosinophils Absolute: 0 10*3/uL (ref 0.0–0.5)
Eosinophils Relative: 1 %
HCT: 38.7 % (ref 36.0–46.0)
Hemoglobin: 13.7 g/dL (ref 12.0–15.0)
Immature Granulocytes: 0 %
Lymphocytes Relative: 10 %
Lymphs Abs: 0.5 10*3/uL — ABNORMAL LOW (ref 0.7–4.0)
MCH: 31.8 pg (ref 26.0–34.0)
MCHC: 35.4 g/dL (ref 30.0–36.0)
MCV: 89.8 fL (ref 80.0–100.0)
Monocytes Absolute: 0.5 10*3/uL (ref 0.1–1.0)
Monocytes Relative: 12 %
Neutro Abs: 3.3 10*3/uL (ref 1.7–7.7)
Neutrophils Relative %: 77 %
Platelet Count: 159 10*3/uL (ref 150–400)
RBC: 4.31 MIL/uL (ref 3.87–5.11)
RDW: 13.7 % (ref 11.5–15.5)
WBC Count: 4.3 10*3/uL (ref 4.0–10.5)
nRBC: 0 % (ref 0.0–0.2)

## 2021-02-15 MED ORDER — SODIUM CHLORIDE 0.9 % IV SOLN
50.0000 mg/m2 | Freq: Once | INTRAVENOUS | Status: AC
  Administered 2021-02-15: 84 mg via INTRAVENOUS
  Filled 2021-02-15: qty 14

## 2021-02-15 MED ORDER — POTASSIUM CHLORIDE CRYS ER 20 MEQ PO TBCR: 20.0000 meq | EXTENDED_RELEASE_TABLET | Freq: Every day | ORAL | 0 refills | Status: DC

## 2021-02-15 MED ORDER — FAMOTIDINE 20 MG IN NS 100 ML IVPB
20.0000 mg | Freq: Once | INTRAVENOUS | Status: AC
  Administered 2021-02-15: 20 mg via INTRAVENOUS
  Filled 2021-02-15: qty 100

## 2021-02-15 MED ORDER — SODIUM CHLORIDE 0.9 % IV SOLN: Freq: Once | INTRAVENOUS | Status: AC

## 2021-02-15 MED ORDER — SODIUM CHLORIDE 0.9 % IV SOLN
146.6000 mg | Freq: Once | INTRAVENOUS | Status: AC
  Administered 2021-02-15: 150 mg via INTRAVENOUS
  Filled 2021-02-15: qty 15

## 2021-02-15 MED ORDER — FAMOTIDINE IN NACL 20-0.9 MG/50ML-% IV SOLN: 20.0000 mg | Freq: Once | INTRAVENOUS | Status: DC

## 2021-02-15 MED ORDER — DIPHENHYDRAMINE HCL 50 MG/ML IJ SOLN
25.0000 mg | Freq: Once | INTRAMUSCULAR | Status: AC
  Administered 2021-02-15: 25 mg via INTRAVENOUS
  Filled 2021-02-15: qty 1

## 2021-02-15 MED ORDER — SUCRALFATE 1 GM/10ML PO SUSP: ORAL | 1 refills | Status: DC

## 2021-02-15 MED ORDER — PALONOSETRON HCL INJECTION 0.25 MG/5ML
0.2500 mg | Freq: Once | INTRAVENOUS | Status: AC
  Administered 2021-02-15: 0.25 mg via INTRAVENOUS
  Filled 2021-02-15: qty 5

## 2021-02-15 MED ORDER — SODIUM CHLORIDE 0.9 % IV SOLN
10.0000 mg | Freq: Once | INTRAVENOUS | Status: AC
  Administered 2021-02-15: 10 mg via INTRAVENOUS
  Filled 2021-02-15: qty 10

## 2021-02-15 NOTE — Progress Notes (Signed)
Patient presents for treatment. RN assessment completed along with the following:  Labs/vitals reviewed - Yes, and within treatment parameters.   Weight within 10% of previous measurement - Yes Oncology Treatment Attestation completed for current therapy- Yes, on date 12/27/2020 Informed consent completed and reflects current therapy/intent - Yes, on date 01/08/2021             Provider progress note reviewed - Today's provider note is not yet available. I reviewed the most recent oncology provider progress note in chart dated 01/15/2021. Treatment/Antibody/Supportive plan reviewed - Yes, and there are no adjustments needed for today's treatment. S&H and other orders reviewed - Yes, and there are no additional orders identified. Previous treatment date reviewed - Yes, and the appropriate amount of time has elapsed between treatments. Clinic Hand Off Received from - Yes from [facility], RN  Patient to proceed with treatment.

## 2021-02-15 NOTE — Progress Notes (Signed)
[place] OFFICE PROGRESS NOTE   Diagnosis: Esophagus cancer  INTERVAL HISTORY:   Alison Garner returns for follow-up.  She continues radiation.  She completed week 2 Taxol/carboplatin 01/22/2021 due to neutropenia.  She contacted the office with a positive COVID test 02/02/2021.  She reports she did not develop any respiratory symptoms.  She was nauseated last week and had some diarrhea.  Better this week.  No numbness or tingling in the hands or feet.  Swallowing is about the same.  She is tolerating a liquid/pured diet.  Objective:  Vital signs in last 24 hours:  Blood pressure 122/78, pulse 100, temperature 98.1 F (36.7 C), temperature source Oral, resp. rate 18, height _0  (1.651 m), weight 125 lb (56.7 kg), SpO2 100 %.    HEENT: White coating over tongue.  No buccal thrush. Resp: Lungs clear bilaterally. Cardio: Regular rate and rhythm. GI: Abdomen soft and nontender.  No hepatomegaly. Vascular: No leg edema.   Lab Results:  Lab Results  Component Value Date   WBC 4.3 02/15/2021   HGB 13.7 02/15/2021   HCT 38.7 02/15/2021   MCV 89.8 02/15/2021   PLT 159 02/15/2021   NEUTROABS 3.3 02/15/2021    Imaging:  No results found.  Medications: I have reviewed the patient's current medications.  Assessment/Plan: Distal esophagus cancer Esophagram 11/21/2020-moderate to severe esophageal dysmotility, narrowing of the distal esophagus just above the GE junction to 4 mm, 13 mm barium tablet stuck in the distal esophagus Upper endoscopy 12/13/2020-severe esophagitis with nodularity and stricturing at 36 cm-adenocarcinoma, HER2 negative, no loss of mismatch repair protein expression, PD-L1 combined positive score less than 1% CT chest 12/14/2020-accentuated density at the distal esophageal lumen above a small type I hiatal hernia concerning for malignancy, nonpathologically enlarged AP window and right hilar nodes PET 12/21/2020-abnormal FDG uptake at the  distal esophagus, no evidence of nodal or distant metastatic disease Radiation 01/08/2021-02/16/2021 Cycle 1 weekly Taxol/carboplatin 01/08/2021 Cycle 2 weekly Taxol/carboplatin 01/15/2021 Treatment held 01/22/2021 due to neutropenia Positive COVID test 02/02/2021 Taxol/carboplatin 02/15/2021 Solid dysphagia secondary #1 COPD on chest CT 12/14/2020 Hemorrhoids Hyperlipidemia Irritable bowel syndrome Hypothyroidism Tobacco use  Disposition: Alison Garner appears stable.  She continues radiation, scheduled to complete 02/16/2021.  She has completed 2 weekly treatments with Taxol/carboplatin.  Treatment subsequently held due to neutropenia, then COVID.  Plan to proceed with the final Taxol/carboplatin treatment today.  She agrees with this plan.  CBC and chemistry panel reviewed.  Labs adequate to proceed with treatment today.  She has hypokalemia.  She has not been taking the potassium supplement.  She will resume.  She has an appointment with Dr. Kipp Brood 02/23/2021.  We will return for lab and follow-up here 03/05/2021.  We are available to see her sooner if needed.  Plan reviewed with Dr. Benay Spice.  Ned Card ANP/GNP-BC   02/15/2021  10:17 AM

## 2021-02-15 NOTE — Progress Notes (Signed)
See telephone note.

## 2021-02-15 NOTE — Telephone Encounter (Signed)
Contacted patient by telephone while she was receiving her chemotherapy at [facility].  Patient thinks this may be her final chemotherapy treatment.  She finishes radiation therapy on Friday, January 13.  Weight decreased to 125 pounds January 12 from 132 pounds December 12.  Patient attributes weight loss to a positive COVID diagnosis.  Reports she had nausea and diarrhea last week.  She is tolerating liquids and a soft diet.  She is pleased that today she is receiving antinausea medication.  She is drinking boost resource breeze, peach flavor, 3 times daily.  She thinks she is going to be able to regain lost weight.  Nutrition diagnosis: Food and nutrition related knowledge deficit improved.  Intervention: Provided support and encouragement for patient to continue strategies for soft moist foods and liquids to promote weight gain. Continue boost breeze 3 times daily. Nausea medication and bowel regimen per MD.  Monitoring, evaluation, goals: Patient will tolerate adequate calories and protein to minimize weight loss.  No follow-up is scheduled patient has contact information for nutrition concerns.  **Disclaimer: This note was dictated with voice recognition software. Similar sounding words can inadvertently be transcribed and this note may contain transcription errors which may not have been corrected upon publication of note.**

## 2021-02-15 NOTE — Patient Instructions (Signed)
Alison Garner   Discharge Instructions: Thank you for choosing [facility] to provide your oncology and hematology care.   If you have a lab appointment with the [facility], please go directly to the [facility] and check in at the registration area.   Wear comfortable clothing and clothing appropriate for easy access to any Portacath or PICC line.   We strive to give you quality time with your provider. You may need to reschedule your appointment if you arrive late (15 or more minutes).  Arriving late affects you and other patients whose appointments are after yours.  Also, if you miss three or more appointments without notifying the office, you may be dismissed from the clinic at the providers discretion.      For prescription refill requests, have your pharmacy contact our office and allow 72 hours for refills to be completed.    Today you received the following chemotherapy and/or immunotherapy agents Paclitaxel (TAXOL) & Carboplatin (PARAPLATIN).      To help prevent nausea and vomiting after your treatment, we encourage you to take your nausea medication as directed.  BELOW ARE SYMPTOMS THAT SHOULD BE REPORTED IMMEDIATELY: *FEVER GREATER THAN 100.4 F (38 C) OR HIGHER *CHILLS OR SWEATING *NAUSEA AND VOMITING THAT IS NOT CONTROLLED WITH YOUR NAUSEA MEDICATION *UNUSUAL SHORTNESS OF BREATH *UNUSUAL BRUISING OR BLEEDING *URINARY PROBLEMS (pain or burning when urinating, or frequent urination) *BOWEL PROBLEMS (unusual diarrhea, constipation, pain near the anus) TENDERNESS IN MOUTH AND THROAT WITH OR WITHOUT PRESENCE OF ULCERS (sore throat, sores in mouth, or a toothache) UNUSUAL RASH, SWELLING OR PAIN  UNUSUAL VAGINAL DISCHARGE OR ITCHING   Items with * indicate a potential emergency and should be followed up as soon as possible or go to the Emergency Department if any problems should occur.  Please show the CHEMOTHERAPY ALERT CARD or  IMMUNOTHERAPY ALERT CARD at check-in to the Emergency Department and triage nurse.  Should you have questions after your visit or need to cancel or reschedule your appointment, please contact [facility]  Dept: [phone]  and follow the prompts.  Office hours are 8:00 a.m. to 4:30 p.m. Monday - Friday. Please note that voicemails left after 4:00 p.m. may not be returned until the following business day.  We are closed weekends and major holidays. You have access to a nurse at all times for urgent questions. Please call the main number to the clinic Dept: [phone] and follow the prompts.   For any non-urgent questions, you may also contact your provider using MyChart. We now offer e-Visits for anyone 50 and older to request care online for non-urgent symptoms. For details visit [email or web address].   Also download the MyChart app! Go to the app store, search "MyChart", open the app, select [facility], and log in with your MyChart username and password.  Due to Covid, a mask is required upon entering the hospital/clinic. If you do not have a mask, one will be given to you upon arrival. For doctor visits, patients may have 1 support person aged 34 or older with them. For treatment visits, patients cannot have anyone with them due to current Covid guidelines and our immunocompromised population.   Paclitaxel injection What is this medication? PACLITAXEL (PAK li TAX el) is a chemotherapy drug. It targets fast dividing cells, like cancer cells, and causes these cells to die. This medicine is used to treat ovarian cancer, breast cancer, lung cancer, Kaposi's sarcoma, and  other cancers. This medicine may be used for other purposes; ask your health care provider or pharmacist if you have questions. COMMON BRAND NAME(S): Onxol, Taxol What should I tell my care team before I take this medication? They need to know if you have any of these conditions: history of irregular  heartbeat liver disease low blood counts, like low white cell, platelet, or red cell counts lung or breathing disease, like asthma tingling of the fingers or toes, or other nerve disorder an unusual or allergic reaction to paclitaxel, alcohol, polyoxyethylated castor oil, other chemotherapy, other medicines, foods, dyes, or preservatives pregnant or trying to get pregnant breast-feeding How should I use this medication? This drug is given as an infusion into a vein. It is administered in a hospital or clinic by a specially trained health care professional. Talk to your pediatrician regarding the use of this medicine in children. Special care may be needed. Overdosage: If you think you have taken too much of this medicine contact a poison control center or emergency room at once. NOTE: This medicine is only for you. Do not share this medicine with others. What if I miss a dose? It is important not to miss your dose. Call your doctor or health care professional if you are unable to keep an appointment. What may interact with this medication? Do not take this medicine with any of the following medications: live virus vaccines This medicine may also interact with the following medications: antiviral medicines for hepatitis, HIV or AIDS certain antibiotics like erythromycin and clarithromycin certain medicines for fungal infections like ketoconazole and itraconazole certain medicines for seizures like carbamazepine, phenobarbital, phenytoin gemfibrozil nefazodone rifampin St. John's wort This list may not describe all possible interactions. Give your health care provider a list of all the medicines, herbs, non-prescription drugs, or dietary supplements you use. Also tell them if you smoke, drink alcohol, or use illegal drugs. Some items may interact with your medicine. What should I watch for while using this medication? Your condition will be monitored carefully while you are receiving this  medicine. You will need important blood work done while you are taking this medicine. This medicine can cause serious allergic reactions. To reduce your risk you will need to take other medicine(s) before treatment with this medicine. If you experience allergic reactions like skin rash, itching or hives, swelling of the face, lips, or tongue, tell your doctor or health care professional right away. In some cases, you may be given additional medicines to help with side effects. Follow all directions for their use. This drug may make you feel generally unwell. This is not uncommon, as chemotherapy can affect healthy cells as well as cancer cells. Report any side effects. Continue your course of treatment even though you feel ill unless your doctor tells you to stop. Call your doctor or health care professional for advice if you get a fever, chills or sore throat, or other symptoms of a cold or flu. Do not treat yourself. This drug decreases your body's ability to fight infections. Try to avoid being around people who are sick. This medicine may increase your risk to bruise or bleed. Call your doctor or health care professional if you notice any unusual bleeding. Be careful brushing and flossing your teeth or using a toothpick because you may get an infection or bleed more easily. If you have any dental work done, tell your dentist you are receiving this medicine. Avoid taking products that contain aspirin, acetaminophen, ibuprofen, naproxen, or  ketoprofen unless instructed by your doctor. These medicines may hide a fever. Do not become pregnant while taking this medicine. Women should inform their doctor if they wish to become pregnant or think they might be pregnant. There is a potential for serious side effects to an unborn child. Talk to your health care professional or pharmacist for more information. Do not breast-feed an infant while taking this medicine. Men are advised not to father a child while  receiving this medicine. This product may contain alcohol. Ask your pharmacist or healthcare provider if this medicine contains alcohol. Be sure to tell all healthcare providers you are taking this medicine. Certain medicines, like metronidazole and disulfiram, can cause an unpleasant reaction when taken with alcohol. The reaction includes flushing, headache, nausea, vomiting, sweating, and increased thirst. The reaction can last from 30 minutes to several hours. What side effects may I notice from receiving this medication? Side effects that you should report to your doctor or health care professional as soon as possible: allergic reactions like skin rash, itching or hives, swelling of the face, lips, or tongue breathing problems changes in vision fast, irregular heartbeat high or low blood pressure mouth sores pain, tingling, numbness in the hands or feet signs of decreased platelets or bleeding - bruising, pinpoint red spots on the skin, black, tarry stools, blood in the urine signs of decreased red blood cells - unusually weak or tired, feeling faint or lightheaded, falls signs of infection - fever or chills, cough, sore throat, pain or difficulty passing urine signs and symptoms of liver injury like dark yellow or brown urine; general ill feeling or flu-like symptoms; light-colored stools; loss of appetite; nausea; right upper belly pain; unusually weak or tired; yellowing of the eyes or skin swelling of the ankles, feet, hands unusually slow heartbeat Side effects that usually do not require medical attention (report to your doctor or health care professional if they continue or are bothersome): diarrhea hair loss loss of appetite muscle or joint pain nausea, vomiting pain, redness, or irritation at site where injected tiredness This list may not describe all possible side effects. Call your doctor for medical advice about side effects. You may report side effects to FDA at  1-800-FDA-1088. Where should I keep my medication? This drug is given in a hospital or clinic and will not be stored at home. NOTE: This sheet is a summary. It may not cover all possible information. If you have questions about this medicine, talk to your doctor, pharmacist, or health care provider.  2022 Elsevier/Gold Standard (2020-10-10 00:00:00)  Carboplatin injection What is this medication? CARBOPLATIN (KAR boe pla tin) is a chemotherapy drug. It targets fast dividing cells, like cancer cells, and causes these cells to die. This medicine is used to treat ovarian cancer and many other cancers. This medicine may be used for other purposes; ask your health care provider or pharmacist if you have questions. COMMON BRAND NAME(S): Paraplatin What should I tell my care team before I take this medication? They need to know if you have any of these conditions: blood disorders hearing problems kidney disease recent or ongoing radiation therapy an unusual or allergic reaction to carboplatin, cisplatin, other chemotherapy, other medicines, foods, dyes, or preservatives pregnant or trying to get pregnant breast-feeding How should I use this medication? This drug is usually given as an infusion into a vein. It is administered in a hospital or clinic by a specially trained health care professional. Talk to your pediatrician regarding the use  of this medicine in children. Special care may be needed. Overdosage: If you think you have taken too much of this medicine contact a poison control center or emergency room at once. NOTE: This medicine is only for you. Do not share this medicine with others. What if I miss a dose? It is important not to miss a dose. Call your doctor or health care professional if you are unable to keep an appointment. What may interact with this medication? medicines for seizures medicines to increase blood counts like filgrastim, pegfilgrastim, sargramostim some  antibiotics like amikacin, gentamicin, neomycin, streptomycin, tobramycin vaccines Talk to your doctor or health care professional before taking any of these medicines: acetaminophen aspirin ibuprofen ketoprofen naproxen This list may not describe all possible interactions. Give your health care provider a list of all the medicines, herbs, non-prescription drugs, or dietary supplements you use. Also tell them if you smoke, drink alcohol, or use illegal drugs. Some items may interact with your medicine. What should I watch for while using this medication? Your condition will be monitored carefully while you are receiving this medicine. You will need important blood work done while you are taking this medicine. This drug may make you feel generally unwell. This is not uncommon, as chemotherapy can affect healthy cells as well as cancer cells. Report any side effects. Continue your course of treatment even though you feel ill unless your doctor tells you to stop. In some cases, you may be given additional medicines to help with side effects. Follow all directions for their use. Call your doctor or health care professional for advice if you get a fever, chills or sore throat, or other symptoms of a cold or flu. Do not treat yourself. This drug decreases your body's ability to fight infections. Try to avoid being around people who are sick. This medicine may increase your risk to bruise or bleed. Call your doctor or health care professional if you notice any unusual bleeding. Be careful brushing and flossing your teeth or using a toothpick because you may get an infection or bleed more easily. If you have any dental work done, tell your dentist you are receiving this medicine. Avoid taking products that contain aspirin, acetaminophen, ibuprofen, naproxen, or ketoprofen unless instructed by your doctor. These medicines may hide a fever. Do not become pregnant while taking this medicine. Women should  inform their doctor if they wish to become pregnant or think they might be pregnant. There is a potential for serious side effects to an unborn child. Talk to your health care professional or pharmacist for more information. Do not breast-feed an infant while taking this medicine. What side effects may I notice from receiving this medication? Side effects that you should report to your doctor or health care professional as soon as possible: allergic reactions like skin rash, itching or hives, swelling of the face, lips, or tongue signs of infection - fever or chills, cough, sore throat, pain or difficulty passing urine signs of decreased platelets or bleeding - bruising, pinpoint red spots on the skin, black, tarry stools, nosebleeds signs of decreased red blood cells - unusually weak or tired, fainting spells, lightheadedness breathing problems changes in hearing changes in vision chest pain high blood pressure low blood counts - This drug may decrease the number of white blood cells, red blood cells and platelets. You may be at increased risk for infections and bleeding. nausea and vomiting pain, swelling, redness or irritation at the injection site pain, tingling, numbness in  the hands or feet problems with balance, talking, walking trouble passing urine or change in the amount of urine Side effects that usually do not require medical attention (report to your doctor or health care professional if they continue or are bothersome): hair loss loss of appetite metallic taste in the mouth or changes in taste This list may not describe all possible side effects. Call your doctor for medical advice about side effects. You may report side effects to FDA at 1-800-FDA-1088. Where should I keep my medication? This drug is given in a hospital or clinic and will not be stored at home. NOTE: This sheet is a summary. It may not cover all possible information. If you have questions about this medicine,  talk to your doctor, pharmacist, or health care provider.  2022 Elsevier/Gold Standard (2007-07-01 00:00:00)

## 2021-02-15 NOTE — Progress Notes (Signed)
Patient seen by Ned Card NP today  Vitals are within treatment parameters.  Labs reviewed by Ned Card NP and are within treatment parameters. Will add oral K+ for home administration for K+ 3.0  Per physician team, patient is ready for treatment and there are NO modifications to the treatment plan.

## 2021-02-16 ENCOUNTER — Other Ambulatory Visit: Payer: Self-pay

## 2021-02-16 ENCOUNTER — Ambulatory Visit: Payer: Medicare Other

## 2021-02-16 ENCOUNTER — Encounter: Payer: Self-pay | Admitting: Radiation Oncology

## 2021-02-16 ENCOUNTER — Ambulatory Visit
Admission: RE | Admit: 2021-02-16 | Discharge: 2021-02-16 | Disposition: A | Discharge status: Home / Self Care | Payer: Medicare Other | Source: Ambulatory Visit | Attending: Radiation Oncology | Admitting: Radiation Oncology

## 2021-02-16 DIAGNOSIS — C155 Malignant neoplasm of lower third of esophagus: Secondary | ICD-10-CM | POA: Diagnosis not present

## 2021-02-18 ENCOUNTER — Other Ambulatory Visit: Payer: Self-pay | Admitting: Oncology

## 2021-02-19 ENCOUNTER — Ambulatory Visit: Payer: Medicare Other

## 2021-02-20 ENCOUNTER — Ambulatory Visit: Payer: Medicare Other

## 2021-02-23 ENCOUNTER — Ambulatory Visit (INDEPENDENT_AMBULATORY_CARE_PROVIDER_SITE_OTHER): Payer: Medicare Other | Admitting: Thoracic Surgery (Cardiothoracic Vascular Surgery)

## 2021-02-23 ENCOUNTER — Other Ambulatory Visit: Payer: Self-pay

## 2021-02-23 DIAGNOSIS — C155 Malignant neoplasm of lower third of esophagus: Secondary | ICD-10-CM | POA: Diagnosis not present

## 2021-02-23 NOTE — Progress Notes (Signed)
°   °  [address]Suite [address]       [place] [address]             [phone]       Patient: Home Provider: Office Consent for Telemedicine visit obtained.  Todays visit was completed via a real-time telehealth (see specific modality noted below). The patient/authorized person provided oral consent at the time of the visit to engage in a telemedicine encounter with the present provider at [facility]. The patient/authorized person was informed of the potential benefits, limitations, and risks of telemedicine. The patient/authorized person expressed understanding that the laws that protect confidentiality also apply to telemedicine. The patient/authorized person acknowledged understanding that telemedicine does not provide emergency services and that he or she would need to call 911 or proceed to the nearest hospital for help if such a need arose.   Total time spent in the clinical discussion 10 minutes.  Telehealth Modality: Phone visit (audio only)  I had a telephone visit with Alison Garner she recently completed her neoadjuvant radiation on February 16, 2021.  She is still undergoing chemotherapy.  She has had significant nausea but this is slowly improving.  She has lost about 12 pounds since the beginning of December.  She remains undecided in regards to whether or not she wants to proceed with surgery, but is willing to have another discussion in 1 month after PET/CT.  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  Dx Weight: 134 Current Weight: 125   Radiation completion date: 02/16/2021 Operative Window: March 2023

## 2021-02-27 ENCOUNTER — Telehealth: Payer: Self-pay | Admitting: Radiology

## 2021-02-27 NOTE — Telephone Encounter (Signed)
Patient states after finishing treatment on 02/16/2021 she has developed esophageal spasms and burning. She asks if there is something other than carafate that would help this.

## 2021-02-28 ENCOUNTER — Other Ambulatory Visit: Payer: Self-pay | Admitting: Radiation Oncology

## 2021-02-28 ENCOUNTER — Encounter: Payer: Self-pay | Admitting: Oncology

## 2021-02-28 MED ORDER — LIDOCAINE VISCOUS HCL 2 % MT SOLN: 15.0000 mL | OROMUCOSAL | 2 refills | Status: DC | PRN

## 2021-03-05 ENCOUNTER — Inpatient Hospital Stay (HOSPITAL_BASED_OUTPATIENT_CLINIC_OR_DEPARTMENT_OTHER): Payer: Medicare Other | Admitting: Oncology

## 2021-03-05 ENCOUNTER — Inpatient Hospital Stay: Payer: Medicare Other

## 2021-03-05 ENCOUNTER — Other Ambulatory Visit: Payer: Self-pay

## 2021-03-05 VITALS — BP 109/59 | HR 100 | Temp 98.1°F | Resp 19 | Ht 65.0 in | Wt 122.8 lb

## 2021-03-05 DIAGNOSIS — C155 Malignant neoplasm of lower third of esophagus: Secondary | ICD-10-CM | POA: Diagnosis not present

## 2021-03-05 DIAGNOSIS — E039 Hypothyroidism, unspecified: Secondary | ICD-10-CM | POA: Diagnosis not present

## 2021-03-05 DIAGNOSIS — J449 Chronic obstructive pulmonary disease, unspecified: Secondary | ICD-10-CM | POA: Diagnosis not present

## 2021-03-05 DIAGNOSIS — E785 Hyperlipidemia, unspecified: Secondary | ICD-10-CM | POA: Insufficient documentation

## 2021-03-05 DIAGNOSIS — Z5111 Encounter for antineoplastic chemotherapy: Secondary | ICD-10-CM | POA: Insufficient documentation

## 2021-03-05 DIAGNOSIS — K580 Irritable bowel syndrome with diarrhea: Secondary | ICD-10-CM | POA: Insufficient documentation

## 2021-03-05 DIAGNOSIS — K649 Unspecified hemorrhoids: Secondary | ICD-10-CM | POA: Insufficient documentation

## 2021-03-05 LAB — CBC WITH DIFFERENTIAL (CANCER CENTER ONLY)
Abs Immature Granulocytes: 0.01 10*3/uL (ref 0.00–0.07)
Basophils Absolute: 0 10*3/uL (ref 0.0–0.1)
Basophils Relative: 1 %
Eosinophils Absolute: 0 10*3/uL (ref 0.0–0.5)
Eosinophils Relative: 0 %
HCT: 39.2 % (ref 36.0–46.0)
Hemoglobin: 13.4 g/dL (ref 12.0–15.0)
Immature Granulocytes: 0 %
Lymphocytes Relative: 22 %
Lymphs Abs: 0.6 10*3/uL — ABNORMAL LOW (ref 0.7–4.0)
MCH: 31.9 pg (ref 26.0–34.0)
MCHC: 34.2 g/dL (ref 30.0–36.0)
MCV: 93.3 fL (ref 80.0–100.0)
Monocytes Absolute: 0.7 10*3/uL (ref 0.1–1.0)
Monocytes Relative: 22 %
Neutro Abs: 1.6 10*3/uL — ABNORMAL LOW (ref 1.7–7.7)
Neutrophils Relative %: 55 %
Platelet Count: 186 10*3/uL (ref 150–400)
RBC: 4.2 MIL/uL (ref 3.87–5.11)
RDW: 15.4 % (ref 11.5–15.5)
WBC Count: 3 10*3/uL — ABNORMAL LOW (ref 4.0–10.5)
nRBC: 0 % (ref 0.0–0.2)

## 2021-03-05 LAB — CMP (CANCER CENTER ONLY)
ALT: 14 U/L (ref 0–44)
AST: 19 U/L (ref 15–41)
Albumin: 4.3 g/dL (ref 3.5–5.0)
Alkaline Phosphatase: 57 U/L (ref 38–126)
Anion gap: 9 (ref 5–15)
BUN: 15 mg/dL (ref 8–23)
CO2: 26 mmol/L (ref 22–32)
Calcium: 10.3 mg/dL (ref 8.9–10.3)
Chloride: 99 mmol/L (ref 98–111)
Creatinine: 1.02 mg/dL — ABNORMAL HIGH (ref 0.44–1.00)
GFR, Estimated: 58 mL/min — ABNORMAL LOW (ref >60)
Glucose, Bld: 147 mg/dL — ABNORMAL HIGH (ref 70–99)
Potassium: 4.2 mmol/L (ref 3.5–5.1)
Sodium: 134 mmol/L — ABNORMAL LOW (ref 135–145)
Total Bilirubin: 0.6 mg/dL (ref 0.3–1.2)
Total Protein: 7.2 g/dL (ref 6.5–8.1)

## 2021-03-05 NOTE — Progress Notes (Signed)
[facility] OFFICE PROGRESS NOTE   Diagnosis: Esophagus cancer  INTERVAL HISTORY:   [name] returns as scheduled.  She continues to have pain and "spasms "with swallowing.  She was prescribed lidocaine by Dr. Sondra Come, but has not tried this.  She continues Protonix and Carafate.  She has chronic intermittent diarrhea.  She is taking potassium. She saw Dr. Kipp Brood on 02/23/2021.  He recommends a restaging PET/CT prior to surgery.  Alison Garner has decided against surgery.  Objective:  Vital signs in last 24 hours:  Blood pressure (!) 109/59, pulse 100, temperature 98.1 F (36.7 C), temperature source Oral, resp. rate 19, height _0  (1.651 m), weight 122 lb 12.8 oz (55.7 kg), SpO2 100 %.    HEENT: No thrush Lymphatics: No cervical or supraclavicular nodes Resp: Lungs clear bilaterally Cardio: Regular rate and rhythm GI: No hepatosplenomegaly Vascular: No leg edema Skin: Radiation hyperpigmentation at the left back  Portacath/PICC-without erythema  Lab Results:  Lab Results  Component Value Date   WBC 3.0 (L) 03/05/2021   HGB 13.4 03/05/2021   HCT 39.2 03/05/2021   MCV 93.3 03/05/2021   PLT 186 03/05/2021   NEUTROABS 1.6 (L) 03/05/2021    CMP  Lab Results  Component Value Date   NA 134 (L) 03/05/2021   K 4.2 03/05/2021   CL 99 03/05/2021   CO2 26 03/05/2021   GLUCOSE 147 (H) 03/05/2021   BUN 15 03/05/2021   CREATININE 1.02 (H) 03/05/2021   CALCIUM 10.3 03/05/2021   PROT 7.2 03/05/2021   ALBUMIN 4.3 03/05/2021   AST 19 03/05/2021   ALT 14 03/05/2021   ALKPHOS 57 03/05/2021   BILITOT 0.6 03/05/2021   GFRNONAA 58 (L) 03/05/2021     Medications: I have reviewed the patient's current medications.   Assessment/Plan: Distal esophagus cancer Esophagram 11/21/2020-moderate to severe esophageal dysmotility, narrowing of the distal esophagus just above the GE junction to 4 mm, 13 mm barium tablet stuck in the distal esophagus Upper endoscopy  12/13/2020-severe esophagitis with nodularity and stricturing at 36 cm-adenocarcinoma, HER2 negative, no loss of mismatch repair protein expression, PD-L1 combined positive score less than 1% CT chest 12/14/2020-accentuated density at the distal esophageal lumen above a small type I hiatal hernia concerning for malignancy, nonpathologically enlarged AP window and right hilar nodes PET 12/21/2020-abnormal FDG uptake at the distal esophagus, no evidence of nodal or distant metastatic disease Radiation 01/08/2021-02/16/2021 Cycle 1 weekly Taxol/carboplatin 01/08/2021 Cycle 2 weekly Taxol/carboplatin 01/15/2021 Treatment held 01/22/2021 due to neutropenia Positive COVID test 02/02/2021 Taxol/carboplatin 02/15/2021 Solid dysphagia secondary #1 COPD on chest CT 12/14/2020 Hemorrhoids Hyperlipidemia Irritable bowel syndrome Hypothyroidism Tobacco use   Disposition: Alison Garner completed the course of radiation.  She completed only 3 weeks of Taxol/carboplatin due to neutropenia and COVID-19 infection.  I discussed treatment options with Alison Garner and her daughter.  She understands surgery is the only potentially curative therapy.  She has decided against surgery. I encouraged her to increase her calorie intake as tolerated.  She will return for an office visit in 3 weeks.  We will consider referring her for a restaging endoscopy for persistent dysphagia.  Treatment of persistent/progressive esophagus cancer will be limited to systemic chemotherapy.  The tumor is negative for HER2 expression and the PD1 score is low.  I would recommend FOLFOX if there is persistent/progressive disease.    Betsy Coder, MD  03/05/2021  12:30 PM

## 2021-03-12 ENCOUNTER — Encounter: Payer: Self-pay | Admitting: Radiation Oncology

## 2021-03-17 NOTE — Progress Notes (Incomplete)
°  Radiation Oncology         ([phone] ________________________________  Patient Name: Alison Garner MRN: [id number] DOB: 02-Feb-1951 Referring Physician: Betsy Coder (Profile Not Attached) Date of Service: 02/16/2021 [facility] [place]                                                        End Of Treatment Note  Diagnoses: C15.5-Malignant neoplasm of lower third of esophagus  Cancer Staging: Adenocarcinoma of the distal esophagus - unstaged   Intent: Curative  Radiation Treatment Dates: 01/08/2021 through 02/16/2021 Site Technique Total Dose (Gy) Dose per Fx (Gy) Completed Fx Beam Energies  Esophagus: Esoph IMRT 45/45 1.8 25/25 6X  Esophagus: Esoph_Bst IMRT 5.4/5.4 1.8 3/3 6X   Narrative: The patient tolerated radiation therapy relatively well. She reports fatigue, SOB on exertion, difficulty swallowing (continues to only take in liquids and soft foods), and improvement in nausea.   Plan: The patient will follow-up with radiation oncology in one month .  ________________________________________________ -----------------------------------  Blair Promise, PhD, MD  This document serves as a record of services personally performed by Gery Pray, MD. It was created on his behalf by Roney Mans, a trained medical scribe. The creation of this record is based on the scribe's personal observations and the provider's statements to them. This document has been checked and approved by the attending provider.

## 2021-03-18 NOTE — Progress Notes (Signed)
Radiation Oncology         [phone] ________________________________  Name: Alison Garner MRN: [id number]  Date: 03/19/2021  DOB: 08/03/1950  Follow-Up Visit Note  CC: Alison Dimmer, MD  Ladell Pier, MD    ICD-10-CM   1. Malignant tumor of lower third of esophagus [facility]  C15.5       Diagnosis: The encounter diagnosis was Malignant tumor of lower third of esophagus ([place]).   Adenocarcinoma of the distal esophagus    Interval Since Last Radiation: 1 month  Intent: Curative  Radiation Treatment Dates: 01/08/2021 through 02/16/2021 Site Technique Total Dose (Gy) Dose per Fx (Gy) Completed Fx Beam Energies  Esophagus: Esoph IMRT 45/45 1.8 25/25 6X  Esophagus: Esoph_Bst IMRT 5.4/5.4 1.8 3/3 6X    Narrative:  The patient returns today for routine follow-up.  The patient tolerated radiation therapy relatively well. She reported fatigue, SOB on exertion, difficulty swallowing (only able to take in liquids and soft foods), and improvement in nausea.        Since she was seen for her initial consultation this past November, the patient began systemic chemotherapy consisting of weekly Taxol/carboplatin on 01/01/21  under the care of Dr. Benay Spice. The patient tolerated chemotherapy relatively well other than reports of mild constipation, neutropenia, diarrhea. However, she was only able to complete 3 weeks of Taxol/carboplatin due to neutropenia and COVID-19 infection. Per her most recent follow up with Dr. Kipp Brood on 03/05/20, the patient reported odynophagia and chronic intermittent diarrhea. I had given the patient lidocaine for her swallowing pain, and she was noted to continue use of Protonix and Carafate.   The patient met with Dr. Kipp Brood on 02/23/21 to discuss surgical options, though the patient opted against pursuing surgery.  Given so, Dr. Learta Codding recommended referring the patient for a restaging endoscopy for management of her persistent dysphagia, and FOLFOX if there  is any evidence of persistent/ progressive disease in the future.    She has noticed some improvement in her swallowing.  She is able to take in most soft foods and is started sampling some solid foods.  Her appetite is good and she is starting to gain weight back.  She denies any cough or breathing problems.                     Allergies:  is allergic to bactrim [sulfamethoxazole-trimethoprim].  Meds: Current Outpatient Medications  Medication Sig Dispense Refill   ciprofloxacin (CIPRO) 250 MG tablet Take 250 mg by mouth 2 (two) times daily. X 10 days     imipramine (TOFRANIL) 25 MG tablet Take 25 mg by mouth daily. Takes 2 tablets at bedtime     levothyroxine (SYNTHROID, LEVOTHROID) 25 MCG tablet at bedtime.     lidocaine (XYLOCAINE) 2 % solution Use as directed 15 mLs in the mouth or throat as needed for mouth pain. Use prior to meals and bedtime, additional instructions given to patient 100 mL 2   pantoprazole (PROTONIX) 40 MG tablet Take 1 tablet (40 mg total) by mouth 2 (two) times daily. 90 tablet 3   sucralfate (CARAFATE) 1 GM/10ML suspension SHAKE LIQUID AND TAKE 10 ML(1 GRAM) BY MOUTH FOUR TIMES DAILY 420 mL 1   ezetimibe-simvastatin (VYTORIN) 10-20 MG tablet Take 1 tablet by mouth every other day.     ondansetron (ZOFRAN) 4 MG tablet Take 1 tablet (4 mg total) by mouth every 8 (eight) hours as needed for nausea or vomiting. (Patient not taking: Reported on  03/19/2021) 20 tablet 1   potassium chloride SA (KLOR-CON M) 20 MEQ tablet Take 1 tablet (20 mEq total) by mouth daily. (Patient not taking: Reported on 03/19/2021) 30 tablet 0   prochlorperazine (COMPAZINE) 10 MG tablet Take 1 tablet (10 mg total) by mouth every 6 (six) hours as needed for nausea. (Patient not taking: Reported on 01/15/2021) 60 tablet 1   No current facility-administered medications for this encounter.    Physical Findings: The patient is in no acute distress. Patient is alert and oriented.  height is _0   (1.651 m) and weight is 121 lb 12.8 oz (55.2 kg). Her temperature is 97.7 F (36.5 C). Her blood pressure is 134/69 and her pulse is 110 (abnormal). Her respiration is 18 and oxygen saturation is 99%. .   Lungs are clear to auscultation bilaterally. Heart has regular rate and rhythm. No palpable cervical, supraclavicular, or axillary adenopathy. Abdomen soft, non-tender, normal bowel sounds.    Lab Findings: Lab Results  Component Value Date   WBC 3.0 (L) 03/05/2021   HGB 13.4 03/05/2021   HCT 39.2 03/05/2021   MCV 93.3 03/05/2021   PLT 186 03/05/2021    Radiographic Findings: No results found.  Impression: The encounter diagnosis was Malignant tumor of lower third of esophagus ([place]).   Adenocarcinoma of the distal esophagus   The patient is recovering from the effects of radiation.  Her swallowing seems to be improved.  She denies any further issues with esophageal spasms.  As above she has decided against pursuing surgery.  Plan: As needed follow-up in radiation oncology.  Patient will continue close follow-up with Dr. Benay Spice.  She  will undergo endoscopy and may be considered considered for chemotherapy at a later date.    ____________________________________  Blair Promise, PhD, MD   This document serves as a record of services personally performed by Gery Pray, MD. It was created on his behalf by Roney Mans, a trained medical scribe. The creation of this record is based on the scribe's personal observations and the provider's statements to them. This document has been checked and approved by the attending provider.

## 2021-03-19 ENCOUNTER — Ambulatory Visit
Admission: RE | Admit: 2021-03-19 | Discharge: 2021-03-19 | Disposition: A | Discharge status: Home / Self Care | Payer: Medicare Other | Source: Ambulatory Visit | Attending: Radiation Oncology | Admitting: Radiation Oncology

## 2021-03-19 ENCOUNTER — Encounter: Payer: Self-pay | Admitting: Radiation Oncology

## 2021-03-19 ENCOUNTER — Other Ambulatory Visit: Payer: Self-pay

## 2021-03-19 DIAGNOSIS — Z923 Personal history of irradiation: Secondary | ICD-10-CM | POA: Insufficient documentation

## 2021-03-19 DIAGNOSIS — K5900 Constipation, unspecified: Secondary | ICD-10-CM | POA: Insufficient documentation

## 2021-03-19 DIAGNOSIS — R1310 Dysphagia, unspecified: Secondary | ICD-10-CM | POA: Insufficient documentation

## 2021-03-19 DIAGNOSIS — Z79899 Other long term (current) drug therapy: Secondary | ICD-10-CM | POA: Insufficient documentation

## 2021-03-19 DIAGNOSIS — R5383 Other fatigue: Secondary | ICD-10-CM | POA: Diagnosis not present

## 2021-03-19 DIAGNOSIS — R110 Nausea: Secondary | ICD-10-CM | POA: Diagnosis not present

## 2021-03-19 DIAGNOSIS — R0602 Shortness of breath: Secondary | ICD-10-CM | POA: Diagnosis not present

## 2021-03-19 DIAGNOSIS — C155 Malignant neoplasm of lower third of esophagus: Secondary | ICD-10-CM | POA: Diagnosis present

## 2021-03-19 DIAGNOSIS — R197 Diarrhea, unspecified: Secondary | ICD-10-CM | POA: Diagnosis not present

## 2021-03-19 HISTORY — DX: Personal history of irradiation: Z92.3

## 2021-03-19 NOTE — Progress Notes (Signed)
Patient in for 1 month follow up after completing radiation therapy to Esophagus.  Patient completed treatment on 02/16/21.      Pain issues, if any:  Patient denies pain.  Appetite-  Good  Weight changes, if any:  Wt Readings from Last 3 Encounters:  03/19/21 121 lb 12.8 oz (55.2 kg)  03/05/21 122 lb 12.8 oz (55.7 kg)  02/15/21 125 lb (56.7 kg)    Swallowing issues, if any:  Patient reports major improvement with swallowing. Continues to use  protonix and Carafate as needed.  Smoking or chewing tobacco? Yes, patient smokes 1/5 pack per day.  Skin- remains intact.  Cough- NO Shortness of breath- yes on exertion.  Other notable issues, if any:   Vitals:   03/19/21 1548  BP: 134/69  Pulse: (!) 110  Resp: 18  Temp: 97.7 F (36.5 C)  SpO2: 99%  Weight: 121 lb 12.8 oz (55.2 kg)  Height: 5\' 5"  (1.651 m)

## 2021-03-27 ENCOUNTER — Encounter: Payer: Self-pay | Admitting: Oncology

## 2021-03-27 ENCOUNTER — Other Ambulatory Visit: Payer: Self-pay

## 2021-03-27 ENCOUNTER — Inpatient Hospital Stay: Payer: Medicare Other | Attending: Radiation Oncology

## 2021-03-27 ENCOUNTER — Encounter: Payer: Self-pay | Admitting: Internal Medicine

## 2021-03-27 ENCOUNTER — Encounter: Payer: Self-pay | Admitting: *Deleted

## 2021-03-27 ENCOUNTER — Encounter: Payer: Self-pay | Admitting: Nurse Practitioner

## 2021-03-27 ENCOUNTER — Inpatient Hospital Stay (HOSPITAL_BASED_OUTPATIENT_CLINIC_OR_DEPARTMENT_OTHER): Payer: Medicare Other | Admitting: Nurse Practitioner

## 2021-03-27 VITALS — BP 134/70 | HR 99 | Temp 98.1°F | Resp 19 | Ht 65.0 in | Wt 121.6 lb

## 2021-03-27 DIAGNOSIS — E039 Hypothyroidism, unspecified: Secondary | ICD-10-CM | POA: Diagnosis not present

## 2021-03-27 DIAGNOSIS — K649 Unspecified hemorrhoids: Secondary | ICD-10-CM | POA: Insufficient documentation

## 2021-03-27 DIAGNOSIS — K589 Irritable bowel syndrome without diarrhea: Secondary | ICD-10-CM | POA: Diagnosis not present

## 2021-03-27 DIAGNOSIS — C155 Malignant neoplasm of lower third of esophagus: Secondary | ICD-10-CM | POA: Diagnosis present

## 2021-03-27 DIAGNOSIS — E785 Hyperlipidemia, unspecified: Secondary | ICD-10-CM | POA: Insufficient documentation

## 2021-03-27 DIAGNOSIS — Z720 Tobacco use: Secondary | ICD-10-CM | POA: Insufficient documentation

## 2021-03-27 DIAGNOSIS — J449 Chronic obstructive pulmonary disease, unspecified: Secondary | ICD-10-CM | POA: Insufficient documentation

## 2021-03-27 LAB — CBC WITH DIFFERENTIAL (CANCER CENTER ONLY)
Abs Immature Granulocytes: 0.02 10*3/uL (ref 0.00–0.07)
Basophils Absolute: 0 10*3/uL (ref 0.0–0.1)
Basophils Relative: 0 %
Eosinophils Absolute: 0 10*3/uL (ref 0.0–0.5)
Eosinophils Relative: 0 %
HCT: 37.1 % (ref 36.0–46.0)
Hemoglobin: 12.5 g/dL (ref 12.0–15.0)
Immature Granulocytes: 0 %
Lymphocytes Relative: 22 %
Lymphs Abs: 1.2 10*3/uL (ref 0.7–4.0)
MCH: 32.4 pg (ref 26.0–34.0)
MCHC: 33.7 g/dL (ref 30.0–36.0)
MCV: 96.1 fL (ref 80.0–100.0)
Monocytes Absolute: 0.5 10*3/uL (ref 0.1–1.0)
Monocytes Relative: 9 %
Neutro Abs: 3.5 10*3/uL (ref 1.7–7.7)
Neutrophils Relative %: 69 %
Platelet Count: 183 10*3/uL (ref 150–400)
RBC: 3.86 MIL/uL — ABNORMAL LOW (ref 3.87–5.11)
RDW: 15.6 % — ABNORMAL HIGH (ref 11.5–15.5)
WBC Count: 5.2 10*3/uL (ref 4.0–10.5)
nRBC: 0 % (ref 0.0–0.2)

## 2021-03-27 LAB — CMP (CANCER CENTER ONLY)
ALT: 17 U/L (ref 0–44)
AST: 18 U/L (ref 15–41)
Albumin: 4 g/dL (ref 3.5–5.0)
Alkaline Phosphatase: 67 U/L (ref 38–126)
Anion gap: 8 (ref 5–15)
BUN: 11 mg/dL (ref 8–23)
CO2: 29 mmol/L (ref 22–32)
Calcium: 9.8 mg/dL (ref 8.9–10.3)
Chloride: 96 mmol/L — ABNORMAL LOW (ref 98–111)
Creatinine: 1.03 mg/dL — ABNORMAL HIGH (ref 0.44–1.00)
GFR, Estimated: 57 mL/min — ABNORMAL LOW (ref >60)
Glucose, Bld: 155 mg/dL — ABNORMAL HIGH (ref 70–99)
Potassium: 3.5 mmol/L (ref 3.5–5.1)
Sodium: 133 mmol/L — ABNORMAL LOW (ref 135–145)
Total Bilirubin: 0.5 mg/dL (ref 0.3–1.2)
Total Protein: 6.5 g/dL (ref 6.5–8.1)

## 2021-03-27 NOTE — Progress Notes (Signed)
[place] OFFICE PROGRESS NOTE   Diagnosis: Esophagus cancer  INTERVAL HISTORY:   Alison Garner returns as scheduled.  She feels stronger.  Swallowing has improved.  She now notes burning discomfort with certain foods.  She continues a soft, liquid diet.  Objective:  Vital signs in last 24 hours:  Blood pressure 134/70, pulse 99, temperature 98.1 F (36.7 C), temperature source Oral, resp. rate 19, height _0  (1.651 m), weight 121 lb 9.6 oz (55.2 kg), SpO2 100 %.    HEENT: No thrush or ulcers. Resp: Lungs clear bilaterally. Cardio: Regular rate and rhythm. GI: Abdomen soft and nontender.  No hepatosplenomegaly. Vascular: No leg edema.     Lab Results:  Lab Results  Component Value Date   WBC 5.2 03/27/2021   HGB 12.5 03/27/2021   HCT 37.1 03/27/2021   MCV 96.1 03/27/2021   PLT 183 03/27/2021   NEUTROABS 3.5 03/27/2021    Imaging:  No results found.  Medications: I have reviewed the patient's current medications.  Assessment/Plan: Distal esophagus cancer Esophagram 11/21/2020-moderate to severe esophageal dysmotility, narrowing of the distal esophagus just above the GE junction to 4 mm, 13 mm barium tablet stuck in the distal esophagus Upper endoscopy 12/13/2020-severe esophagitis with nodularity and stricturing at 36 cm-adenocarcinoma, HER2 negative, no loss of mismatch repair protein expression, PD-L1 combined positive score less than 1% CT chest 12/14/2020-accentuated density at the distal esophageal lumen above a small type I hiatal hernia concerning for malignancy, nonpathologically enlarged AP window and right hilar nodes PET 12/21/2020-abnormal FDG uptake at the distal esophagus, no evidence of nodal or distant metastatic disease Radiation 01/08/2021-02/16/2021 Cycle 1 weekly Taxol/carboplatin 01/08/2021 Cycle 2 weekly Taxol/carboplatin 01/15/2021 Treatment held 01/22/2021 due to neutropenia Positive COVID test 02/02/2021 Taxol/carboplatin  02/15/2021 Solid dysphagia secondary #1 COPD on chest CT 12/14/2020 Hemorrhoids Hyperlipidemia Irritable bowel syndrome Hypothyroidism Tobacco use  Disposition: Alison Garner appears improved.  The dysphagia is better.  Dr. Benay Spice recommends a referral to Dr. Hilarie Fredrickson for a restaging upper endoscopy.  She will return for an office visit in 6 weeks.  We are available to see her sooner if needed.  Patient seen with Dr. Benay Spice.   Ned Card ANP/GNP-BC   03/27/2021  1:57 PM This was a shared visit with Ned Card.  Alison Garner has experienced improvement in odynophagia over the past several weeks.  We will refer her to Dr. Hilarie Fredrickson for a restaging endoscopy.  She will be referred referred for restaging CTs based on the endoscopic findings.  She has decided against surgery.  I was present for greater than 50% of today's visit.  I performed medical decision making.  Julieanne Manson, MD

## 2021-03-27 NOTE — Progress Notes (Signed)
Sent staff message to Dr. Vena Rua nurse and procedure scheduler with need for repeat staging endoscopy. RT/chemo completed and she declines surgery.

## 2021-03-29 ENCOUNTER — Encounter: Payer: Self-pay | Admitting: Oncology

## 2021-04-03 ENCOUNTER — Telehealth: Payer: Self-pay

## 2021-04-03 NOTE — Telephone Encounter (Signed)
Dr. Hilarie Fredrickson:  Pt has been scheduled by her oncologist for a restaging EGD.    OV with 03/27/21 oncologist notes last chemo was 02/15/21 and she has now completed all.    Is she good to proceed or do you want an OV prior.    Thank you for your guidance.

## 2021-04-03 NOTE — Telephone Encounter (Signed)
Dr. Hilarie Fredrickson:   Pt has been scheduled by her oncologist for a restaging EGD.     OV with 03/27/21 oncologist notes last chemo was 02/15/21 and she has now completed all.     Is she good to proceed or do you want an OV prior.     Thank you for your guidance.

## 2021-04-03 NOTE — Telephone Encounter (Signed)
Ok to proceed. 

## 2021-04-09 ENCOUNTER — Ambulatory Visit (AMBULATORY_SURGERY_CENTER): Payer: Medicare Other

## 2021-04-09 ENCOUNTER — Other Ambulatory Visit: Payer: Self-pay

## 2021-04-09 VITALS — Ht 65.5 in | Wt 122.0 lb

## 2021-04-09 DIAGNOSIS — C159 Malignant neoplasm of esophagus, unspecified: Secondary | ICD-10-CM

## 2021-04-09 NOTE — Progress Notes (Signed)
No egg or soy allergy known to patient  ?No issues known to pt with past sedation with any surgeries or procedures ?Patient denies ever being told they had issues or difficulty with intubation  ?No FH of Malignant Hyperthermia ?Pt is not on diet pills ?Pt is not on home 02  ?Pt is not on blood thinners  ?Pt denies issues with constipation at this time;   ?No A fib or A flutter ?Pt is fully vaccinated for Covid x 2; ?NO PA's for preps discussed with pt in PV today  ?Discussed with pt there will be an out-of-pocket cost for prep and that varies from $0 to 70 + dollars - pt verbalized understanding  ?Due to the COVID-19 pandemic we are asking patients to follow certain guidelines in PV and the [place]   ?Pt aware of COVID protocols and [facility] guidelines  ?PV completed over the phone. Pt verified name, DOB, address and insurance during PV today.  ?Pt mailed instruction packet with copy of consent form to read and not return, and instructions.  ?Pt encouraged to call with questions or issues.  ?If pt has My chart, procedure instructions sent via My Chart  ? ?

## 2021-04-22 ENCOUNTER — Encounter: Payer: Self-pay | Admitting: Certified Registered Nurse Anesthetist

## 2021-04-30 ENCOUNTER — Encounter: Payer: Self-pay | Admitting: Internal Medicine

## 2021-04-30 ENCOUNTER — Ambulatory Visit (AMBULATORY_SURGERY_CENTER): Payer: Medicare Other | Admitting: Internal Medicine

## 2021-04-30 VITALS — BP 158/71 | HR 82 | Temp 96.4°F | Resp 24 | Ht 65.5 in | Wt 122.0 lb

## 2021-04-30 DIAGNOSIS — K2970 Gastritis, unspecified, without bleeding: Secondary | ICD-10-CM

## 2021-04-30 DIAGNOSIS — C159 Malignant neoplasm of esophagus, unspecified: Secondary | ICD-10-CM

## 2021-04-30 DIAGNOSIS — K2950 Unspecified chronic gastritis without bleeding: Secondary | ICD-10-CM | POA: Diagnosis not present

## 2021-04-30 DIAGNOSIS — K2210 Ulcer of esophagus without bleeding: Secondary | ICD-10-CM | POA: Diagnosis not present

## 2021-04-30 MED ORDER — SODIUM CHLORIDE 0.9 % IV SOLN: 500.0000 mL | Freq: Once | INTRAVENOUS | Status: DC

## 2021-04-30 NOTE — Progress Notes (Signed)
VS completed by DT. ? ? ?Pt's states no medical or surgical changes since previsit or office visit. ? ? ?Pediatric scope per pt requested. ? ?

## 2021-04-30 NOTE — Op Note (Signed)
[place] ?Patient Name: Alison Garner ?Procedure Date: 04/30/2021 9:59 AM ?MRN: [id number] ?Endoscopist: Jerene Bears , MD ?Age: 71 ?Referring MD:  ?Date of Birth: 08-10-1950 ?Gender: Female ?Account #: [id number] ?Procedure:                Upper GI endoscopy ?Indications:              Follow-up of malignant esophageal adenocarcinoma  ?                          diagnosis 11/2020, s/p chemotherapy and radiation ?Medicines:                Monitored Anesthesia Care ?Procedure:                Pre-Anesthesia Assessment: ?                          - Prior to the procedure, a History and Physical  ?                          was performed, and patient medications and  ?                          allergies were reviewed. The patient's tolerance of  ?                          previous anesthesia was also reviewed. The risks  ?                          and benefits of the procedure and the sedation  ?                          options and risks were discussed with the patient.  ?                          All questions were answered, and informed consent  ?                          was obtained. Prior Anticoagulants: The patient has  ?                          taken no previous anticoagulant or antiplatelet  ?                          agents. ASA Grade Assessment: II - A patient with  ?                          mild systemic disease. After reviewing the risks  ?                          and benefits, the patient was deemed in  ?                          satisfactory condition to undergo the procedure. ?  After obtaining informed consent, the endoscope was  ?                          passed under direct vision. Throughout the  ?                          procedure, the patient's blood pressure, pulse, and  ?                          oxygen saturations were monitored continuously. The  ?                          GIF [id number] #[id number] was introduced through the  ?                          mouth, and  advanced to the second part of duodenum.  ?                          The upper GI endoscopy was accomplished without  ?                          difficulty. The patient tolerated the procedure  ?                          well. ?Scope In: ?Scope Out: ?Findings:                 In the proximal esophagus there was a small inlet  ?                          patches without nodularity or visible abnormality. ?                          Localized mucosal changes characterized by altered  ?                          texture/vascularity and shallow flat ulceration was  ?                          found in the distal esophagus. In the distal  ?                          esophagus there is no clearly visible tumor.  ?                          Biopsies were taken with a cold forceps for  ?                          histology. ?                          Moderate (circumferential scarring or stenosis; an  ?                          endoscope may pass) stenosis was found in the  ?  distal esophagus due to treatment effect. The  ?                          stenosis was traversed and there were rents after  ?                          passage of the upper endoscope. Thereafter the  ?                          scope moves freely across the GE junction. ?                          Nodular mucosa was found in the cardia immediately  ?                          below the Z-line. Biopsies were taken with a cold  ?                          forceps for histology. This nodularity was visible  ?                          on forward view but much less so on retroflexion. ?                          The exam of the stomach was otherwise normal. ?                          The examined duodenum was normal. ?Complications:            No immediate complications. ?Estimated Blood Loss:     Estimated blood loss was minimal. ?Impression:               - Treatment effect in the distal esophagus just  ?                          above the  GEJ. No definite tumor seen in this  ?                          location. Biopsied. ?                          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  ?                          Upper endoscope now passes this freely. ?                          - Nodular mucosa in the cardia. Biopsied. ?                          - Normal examined duodenum. ?Recommendation:           - Patient has a contact number available for  ?  emergencies. The signs and symptoms of potential  ?                          delayed complications were discussed with the  ?                          patient. Return to normal activities tomorrow.  ?                          Written discharge instructions were provided to the  ?                          patient. ?                          - Resume previous diet. ?                          - Continue present medications. ?                          - Await pathology results. ?                          - Follow-up as scheduled with Dr. Benay Spice. ?Jerene Bears, MD ?04/30/2021 10:32:56 AM ?This report has been signed electronically. ?

## 2021-04-30 NOTE — Progress Notes (Signed)
Called to room to assist during endoscopic procedure.  Patient ID and intended procedure confirmed with present staff. Received instructions for my participation in the procedure from the performing physician.  

## 2021-04-30 NOTE — Progress Notes (Signed)
1002 Robinul 0.1 mg IV given due large amount of secretions upon assessment.  MD made aware, vss 

## 2021-04-30 NOTE — Patient Instructions (Addendum)
Await pathology results from the biopsies taken today. ? ?Resume previous diet and medications. ? ?Follow up as scheduled with Dr. Benay Spice. ? ? ?YOU HAD AN ENDOSCOPIC PROCEDURE TODAY AT [facility]:   Refer to the procedure report that was given to you for any specific questions about what was found during the examination.  If the procedure report does not answer your questions, please call your gastroenterologist to clarify.  If you requested that your care partner not be given the details of your procedure findings, then the procedure report has been included in a sealed envelope for you to review at your convenience later. ? ?YOU SHOULD EXPECT: Some feelings of bloating in the abdomen. Passage of more gas than usual.  Walking can help get rid of the air that was put into your GI tract during the procedure and reduce the bloating. If you had a lower endoscopy (such as a colonoscopy or flexible sigmoidoscopy) you may notice spotting of blood in your stool or on the toilet paper. If you underwent a bowel prep for your procedure, you may not have a normal bowel movement for a few days. ? ?Please Note:  You might notice some irritation and congestion in your nose or some drainage.  This is from the oxygen used during your procedure.  There is no need for concern and it should clear up in a day or so. ? ?SYMPTOMS TO REPORT IMMEDIATELY: ? ?Following upper endoscopy (EGD) ? Vomiting of blood or coffee ground material ? New chest pain or pain under the shoulder blades ? Painful or persistently difficult swallowing ? New shortness of breath ? Fever of 100?F or higher ? Black, tarry-looking stools ? ?For urgent or emergent issues, a gastroenterologist can be reached at any hour by calling [phone]. ?Do not use MyChart messaging for urgent concerns.  ? ? ?DIET:  We do recommend a small meal at first, but then you may proceed to your regular diet.  Drink plenty of fluids but you should avoid alcoholic  beverages for 24 hours. ? ?ACTIVITY:  You should plan to take it easy for the rest of today and you should NOT DRIVE or use heavy machinery until tomorrow (because of the sedation medicines used during the test).   ? ?FOLLOW UP: ?Our staff will call the number listed on your records 48-72 hours following your procedure to check on you and address any questions or concerns that you may have regarding the information given to you following your procedure. If we do not reach you, we will leave a message.  We will attempt to reach you two times.  During this call, we will ask if you have developed any symptoms of COVID 19. If you develop any symptoms (ie: fever, flu-like symptoms, shortness of breath, cough etc.) before then, please call [phone].  If you test positive for Covid 19 in the 2 weeks post procedure, please call and report this information to [place].   ? ?If any biopsies were taken you will be contacted by phone or by letter within the next 1-3 weeks.  Please call us at [phone] if you have not heard about the biopsies in 3 weeks.  ? ? ?SIGNATURES/CONFIDENTIALITY: ?You and/or your care partner have signed paperwork which will be entered into your electronic medical record.  These signatures attest to the fact that that the information above on your After Visit Summary has been reviewed and is understood.  Full responsibility of the confidentiality of  this discharge information lies with you and/or your care-partner.  ?

## 2021-04-30 NOTE — Progress Notes (Signed)
? ?GASTROENTEROLOGY PROCEDURE H&P NOTE  ? ?Primary Care Physician: ?[name], MD ? ? ? ?Reason for Procedure:  History of esophageal cancer ? ?Plan:    Upper endoscopy ? ?Patient is appropriate for endoscopic procedure(s) in the ambulatory ([facility]) setting. ? ?The nature of the procedure, as well as the risks, benefits, and alternatives were carefully and thoroughly reviewed with the patient. Ample time for discussion and questions allowed. The patient understood, was satisfied, and agreed to proceed.  ? ? ? ?HPI: ?Alison Garner is a 71 y.o. female who presents for upper endoscopy to reevaluate esophageal tumor after treatment.  Medical history as below.  No recent chest pain or shortness of breath.  No abdominal pain today.  She reports overall dysphagia has improved though she is not tested this with tough meats. ? ?Past Medical History:  ?Diagnosis Date  ? Arthritis   ? Carotid bruit   ? Diverticulosis   ? External hemorrhoids   ? Hiatal hernia   ? History of radiation therapy   ? Esophagus- 01/08/21-02/16/21- Dr. Gery Pray  ? Hyperlipidemia   ? Hypothyroidism   ? Internal hemorrhoids   ? Irritable bowel syndrome   ? Kidney infection   ? UTI hx  ? Kidney stones   ? Lichen planus   ? Macular cyst, hole, or pseudohole, unspecified eye   ? Thyroid disease   ? thyroid nodule  ? Vitamin D deficiency   ? ? ?Past Surgical History:  ?Procedure Laterality Date  ? COLONOSCOPY  2017  ? JMP-MAC-good prep-tics/hems/normal colon  ? [place] OF UTERUS  1982  ? UPPER GASTROINTESTINAL ENDOSCOPY  12/2020  ? ? ?Prior to Admission medications   ?Medication Sig Start Date End Date Taking? Authorizing Provider  ?B COMPLEX-C PO Take 1 Dose by mouth daily at 6 (six) AM. B total drops- liquid vitamin b   Yes [provider]  ?ezetimibe-simvastatin (VYTORIN) 10-20 MG tablet Take 1 tablet by mouth every other day.   Yes [provider]  ?imipramine (TOFRANIL) 25 MG tablet Take 25 mg by mouth  daily. Takes 2 tablets at bedtime   Yes [provider]  ?levothyroxine (SYNTHROID, LEVOTHROID) 25 MCG tablet at bedtime. 08/02/17  Yes [provider]  ?pantoprazole (PROTONIX) 40 MG tablet Take 1 tablet (40 mg total) by mouth 2 (two) times daily. 12/13/20  Yes Xylan Sheils, Lajuan Lines, MD  ?sucralfate (CARAFATE) 1 GM/10ML suspension SHAKE LIQUID AND TAKE 10 ML(1 GRAM) BY MOUTH FOUR TIMES DAILY 02/15/21  Yes Gery Pray, MD  ?mupirocin ointment (BACTROBAN) 2 % Apply 1 application topically 3 (three) times daily. ?Patient not taking: Reported on 04/30/2021 04/03/21   [provider]  ?ondansetron (ZOFRAN) 4 MG tablet Take 1 tablet (4 mg total) by mouth every 8 (eight) hours as needed for nausea or vomiting. ?Patient not taking: Reported on 03/19/2021 02/06/21   Gery Pray, MD  ?prochlorperazine (COMPAZINE) 10 MG tablet Take 1 tablet (10 mg total) by mouth every 6 (six) hours as needed for nausea. ?Patient not taking: Reported on 01/15/2021 12/27/20   Ladell Pier, MD  ? ? ?Current Outpatient Medications  ?Medication Sig Dispense Refill  ? B COMPLEX-C PO Take 1 Dose by mouth daily at 6 (six) AM. B total drops- liquid vitamin b    ? ezetimibe-simvastatin (VYTORIN) 10-20 MG tablet Take 1 tablet by mouth every other day.    ? imipramine (TOFRANIL) 25 MG tablet Take 25 mg by mouth daily. Takes 2 tablets at  bedtime    ? levothyroxine (SYNTHROID, LEVOTHROID) 25 MCG tablet at bedtime.    ? pantoprazole (PROTONIX) 40 MG tablet Take 1 tablet (40 mg total) by mouth 2 (two) times daily. 90 tablet 3  ? sucralfate (CARAFATE) 1 GM/10ML suspension SHAKE LIQUID AND TAKE 10 ML(1 GRAM) BY MOUTH FOUR TIMES DAILY 420 mL 1  ? mupirocin ointment (BACTROBAN) 2 % Apply 1 application topically 3 (three) times daily. (Patient not taking: Reported on 04/30/2021)    ? ondansetron (ZOFRAN) 4 MG tablet Take 1 tablet (4 mg total) by mouth every 8 (eight) hours as needed for nausea or vomiting. (Patient not taking: Reported on  03/19/2021) 20 tablet 1  ? prochlorperazine (COMPAZINE) 10 MG tablet Take 1 tablet (10 mg total) by mouth every 6 (six) hours as needed for nausea. (Patient not taking: Reported on 01/15/2021) 60 tablet 1  ? ?Current Facility-Administered Medications  ?Medication Dose Route Frequency Provider Last Rate Last Admin  ? 0.9 %  sodium chloride infusion  500 mL Intravenous Once Asael Pann, Lajuan Lines, MD      ? ? ?Allergies as of 04/30/2021 - Review Complete 04/30/2021  ?Allergen Reaction Noted  ? Bactrim [sulfamethoxazole-trimethoprim] Rash 09/07/2015  ? ? ?Family History  ?Problem Relation Age of Onset  ? Heart disease Mother   ? Hypertension Mother   ? Heart disease Father   ? Heart failure Father   ? Thyroid disease Brother   ? Heart disease Maternal Aunt   ? Other Maternal Aunt   ?     MDS  ? Heart disease Maternal Uncle   ? Heart failure Maternal Uncle   ? Myasthenia gravis Paternal Aunt   ? Heart disease Paternal Uncle   ? Heart failure Paternal Uncle   ? Colon cancer Neg Hx   ? Esophageal cancer Neg Hx   ? Stomach cancer Neg Hx   ? Rectal cancer Neg Hx   ? Colon polyps Neg Hx   ? ? ?Social History  ? ?Socioeconomic History  ? Marital status: Single  ?  Spouse name: Not on file  ? Number of children: Not on file  ? Years of education: Not on file  ? Highest education level: Not on file  ?Occupational History  ? Not on file  ?Tobacco Use  ? Smoking status: Every Day  ?  Packs/day: 0.50  ?  Years: 50.00  ?  Pack years: 25.00  ?  Types: Cigarettes  ? Smokeless tobacco: Never  ?Vaping Use  ? Vaping Use: Never used  ?Substance and Sexual Activity  ? Alcohol use: Yes  ?  Comment: rare  ? Drug use: No  ? Sexual activity: Not on file  ?Other Topics Concern  ? Not on file  ?Social History Narrative  ? Not on file  ? ?Social Determinants of Health  ? ?Financial Resource Strain: Not on file  ?Food Insecurity: Not on file  ?Transportation Needs: Not on file  ?Physical Activity: Not on file  ?Stress: Not on file  ?Social Connections: Not  on file  ?Intimate Partner Violence: Not on file  ? ? ?Physical Exam: ?Vital signs in last 24 hours: ?_0  (!) 169/81   Pulse 91   Temp (!) 96.4 ?F (35.8 ?C) (Temporal)   Resp 15   Ht 5' 5.5" (1.664 m)   Wt 122 lb (55.3 kg)   SpO2 100%   BMI 19.99 kg/m?  ?GEN: NAD ?EYE: Sclerae anicteric ?ENT: MMM ?CV: Non-tachycardic ?Pulm: CTA b/l ?GI: Soft, NT/ND ?  NEURO:  Alert & Oriented x 3 ? ? ?Zenovia Jarred, MD ?[facility] ? ?04/30/2021 10:08 AM ? ?

## 2021-04-30 NOTE — Progress Notes (Signed)
Report given to PACU, vss 

## 2021-05-02 ENCOUNTER — Telehealth: Payer: Self-pay | Admitting: *Deleted

## 2021-05-02 NOTE — Telephone Encounter (Signed)
?  Follow up Call- ? ? ?  04/30/2021  ?  9:32 AM 12/13/2020  ? 11:11 AM  ?Call back number  ?Post procedure Call Back phone  # [phone]  ?Permission to leave phone message Yes Yes  ?  ? ?Patient questions: ? ?Do you have a fever, pain , or abdominal swelling? No. ?Pain Score  0 * ? ?Have you tolerated food without any problems? Yes.   ? ?Have you been able to return to your normal activities? Yes.   ? ?Do you have any questions about your discharge instructions: ?Diet   No. ?Medications  No. ?Follow up visit  No. ? ?Do you have questions or concerns about your Care? No. ? ?Actions: ?* If pain score is 4 or above: ?No action needed, pain <4. ? ? ?

## 2021-05-07 ENCOUNTER — Inpatient Hospital Stay: Payer: Medicare Other | Attending: Radiation Oncology | Admitting: Oncology

## 2021-05-07 VITALS — BP 116/54 | HR 95 | Temp 97.8°F | Resp 18 | Ht 65.0 in | Wt 122.0 lb

## 2021-05-07 DIAGNOSIS — C155 Malignant neoplasm of lower third of esophagus: Secondary | ICD-10-CM | POA: Diagnosis present

## 2021-05-07 DIAGNOSIS — K649 Unspecified hemorrhoids: Secondary | ICD-10-CM | POA: Diagnosis not present

## 2021-05-07 DIAGNOSIS — E039 Hypothyroidism, unspecified: Secondary | ICD-10-CM | POA: Diagnosis not present

## 2021-05-07 DIAGNOSIS — E785 Hyperlipidemia, unspecified: Secondary | ICD-10-CM | POA: Insufficient documentation

## 2021-05-07 DIAGNOSIS — K589 Irritable bowel syndrome without diarrhea: Secondary | ICD-10-CM | POA: Insufficient documentation

## 2021-05-07 DIAGNOSIS — J449 Chronic obstructive pulmonary disease, unspecified: Secondary | ICD-10-CM | POA: Insufficient documentation

## 2021-05-07 NOTE — Progress Notes (Signed)
?  Reading ?OFFICE PROGRESS NOTE ? ? ?Diagnosis: Gastroesophageal cancer ? ?INTERVAL HISTORY:  ? ?Alison Garner returns as scheduled.  She feels well.  No dysphagia.  She underwent a restaging upper endoscopy by Dr. Hilarie Fredrickson on 04/30/2021.  A shallow flat ulcer was found in the distal esophagus.  No visible tumor.  Biopsies were obtained.  Moderate stenosis in the distal esophagus.  Nodular mucosa was found in the gastric cardia.  Biopsies were obtained. ?The pathology from the gastric cardia and distal esophagus were negative for malignancy. ?Objective: ? ?Vital signs in last 24 hours: ? ?Blood pressure (!) 116/54, pulse 95, temperature 97.8 ?F (36.6 ?C), temperature source Oral, resp. rate 18, height _0  (1.651 m), weight 122 lb (55.3 kg), SpO2 99 %. ?  ? ?HEENT: Neck without mass ?Lymphatics: No cervical, supraclavicular, axillary, or inguinal nodes ?Resp: Lungs with coarse end inspiratory rhonchi at the left posterior base, no respiratory distress ?Cardio: Regular rate and rhythm ?GI: No hepatosplenomegaly ? ? ? ?Lab Results: ? ?Lab Results  ?Component Value Date  ? WBC 5.2 03/27/2021  ? HGB 12.5 03/27/2021  ? HCT 37.1 03/27/2021  ? MCV 96.1 03/27/2021  ? PLT 183 03/27/2021  ? NEUTROABS 3.5 03/27/2021  ? ? ?CMP  ?Lab Results  ?Component Value Date  ? NA 133 (L) 03/27/2021  ? K 3.5 03/27/2021  ? CL 96 (L) 03/27/2021  ? CO2 29 03/27/2021  ? GLUCOSE 155 (H) 03/27/2021  ? BUN 11 03/27/2021  ? CREATININE 1.03 (H) 03/27/2021  ? CALCIUM 9.8 03/27/2021  ? PROT 6.5 03/27/2021  ? ALBUMIN 4.0 03/27/2021  ? AST 18 03/27/2021  ? ALT 17 03/27/2021  ? ALKPHOS 67 03/27/2021  ? BILITOT 0.5 03/27/2021  ? GFRNONAA 57 (L) 03/27/2021  ? ? ?Medications: I have reviewed the patient's current medications. ? ? ?Assessment/Plan: ? ?Distal esophagus cancer ?Esophagram 11/21/2020-moderate to severe esophageal dysmotility, narrowing of the distal esophagus just above the GE junction to 4 mm, 13 mm barium tablet stuck in the  distal esophagus ?Upper endoscopy 12/13/2020-severe esophagitis with nodularity and stricturing at 36 cm-adenocarcinoma, HER2 negative, no loss of mismatch repair protein expression, PD-L1 combined positive score less than 1% ?CT chest 12/14/2020-accentuated density at the distal esophageal lumen above a small type I hiatal hernia concerning for malignancy, nonpathologically enlarged AP window and right hilar nodes ?PET 12/21/2020-abnormal FDG uptake at the distal esophagus, no evidence of nodal or distant metastatic disease ?Radiation 01/08/2021-02/16/2021 ?Cycle 1 weekly Taxol/carboplatin 01/08/2021 ?Cycle 2 weekly Taxol/carboplatin 01/15/2021 ?Treatment held 01/22/2021 due to neutropenia ?Positive COVID test 02/02/2021 ?Taxol/carboplatin 02/15/2021 ?Endoscopy 04/30/2021-shallow ulceration at the distal esophagus, nodularity at the gastric cardia-biopsies negative for malignancy ?Solid dysphagia secondary #1, resolved ?COPD on chest CT 12/14/2020 ?Hemorrhoids ?Hyperlipidemia ?Irritable bowel syndrome ?Hypothyroidism ?Tobacco use ? ? ?Disposition: ?Alison Garner is in clinical remission from gastroesophageal cancer.  The restaging upper endoscopy revealed no malignancy.  She appears to have experienced a complete clinical response to therapy.  I recommend observation.  We discussed the indication for repeat imaging.  The initial CT and PET revealed no evidence of disease outside of the esophagus.  We plan to follow her clinically. ? ?She will return for an office visit in 3 months. ? ?Betsy Coder, MD ? ?05/07/2021  ?3:07 PM ? ? ?

## 2021-07-27 ENCOUNTER — Telehealth: Payer: Self-pay | Admitting: Internal Medicine

## 2021-07-27 ENCOUNTER — Other Ambulatory Visit: Payer: Self-pay | Admitting: Internal Medicine

## 2021-07-27 MED ORDER — PANTOPRAZOLE SODIUM 40 MG PO TBEC: 40.0000 mg | DELAYED_RELEASE_TABLET | Freq: Two times a day (BID) | ORAL | 0 refills | Status: DC

## 2021-08-06 ENCOUNTER — Inpatient Hospital Stay: Payer: Medicare Other | Attending: Radiation Oncology | Admitting: Oncology

## 2021-08-06 VITALS — BP 126/57 | HR 98 | Temp 98.1°F | Resp 18 | Ht 65.0 in | Wt 129.6 lb

## 2021-08-06 DIAGNOSIS — J449 Chronic obstructive pulmonary disease, unspecified: Secondary | ICD-10-CM | POA: Diagnosis not present

## 2021-08-06 DIAGNOSIS — C155 Malignant neoplasm of lower third of esophagus: Secondary | ICD-10-CM | POA: Diagnosis present

## 2021-08-06 NOTE — Progress Notes (Signed)
  [facility] OFFICE PROGRESS NOTE   Diagnosis: Esophagus cancer  INTERVAL HISTORY:    [name] returns as scheduled.  She feels well.  No solid dysphagia.  She is gaining weight.  She has occasional mild dysphagia when taking pills in the morning.  No regurgitation.  Objective:  Vital signs in last 24 hours:  Blood pressure (!) 126/57, pulse 98, temperature 98.1 F (36.7 C), temperature source Oral, resp. rate 18, height _0  (1.651 m), weight 129 lb 9.6 oz (58.8 kg), SpO2 98 %.    HEENT: Neck without mass Lymphatics: No cervical, supraclavicular, axillary, or inguinal nodes Resp: Lungs with distant breath sounds, and coarse rhonchi at the posterior base bilaterally, no respiratory distress Cardio: Distant heart sounds, regular rate and rhythm GI: No hepatosplenomegaly Vascular: No leg   Lab Results:  Lab Results  Component Value Date   WBC 5.2 03/27/2021   HGB 12.5 03/27/2021   HCT 37.1 03/27/2021   MCV 96.1 03/27/2021   PLT 183 03/27/2021   NEUTROABS 3.5 03/27/2021    CMP  Lab Results  Component Value Date   NA 133 (L) 03/27/2021   K 3.5 03/27/2021   CL 96 (L) 03/27/2021   CO2 29 03/27/2021   GLUCOSE 155 (H) 03/27/2021   BUN 11 03/27/2021   CREATININE 1.03 (H) 03/27/2021   CALCIUM 9.8 03/27/2021   PROT 6.5 03/27/2021   ALBUMIN 4.0 03/27/2021   AST 18 03/27/2021   ALT 17 03/27/2021   ALKPHOS 67 03/27/2021   BILITOT 0.5 03/27/2021   GFRNONAA 57 (L) 03/27/2021    No results found for: "CEA1", "CEA", "RJJ884", "CA125"  No results found for: "INR", "LABPROT"  Imaging:  No results found.  Medications: I have reviewed the patient's current medications.   Assessment/Plan:  Distal esophagus cancer Esophagram 11/21/2020-moderate to severe esophageal dysmotility, narrowing of the distal esophagus just above the GE junction to 4 mm, 13 mm barium tablet stuck in the distal esophagus Upper endoscopy 12/13/2020-severe esophagitis with  nodularity and stricturing at 36 cm-adenocarcinoma, HER2 negative, no loss of mismatch repair protein expression, PD-L1 combined positive score less than 1% CT chest 12/14/2020-accentuated density at the distal esophageal lumen above a small type I hiatal hernia concerning for malignancy, nonpathologically enlarged AP window and right hilar nodes PET 12/21/2020-abnormal FDG uptake at the distal esophagus, no evidence of nodal or distant metastatic disease Radiation 01/08/2021-02/16/2021 Cycle 1 weekly Taxol/carboplatin 01/08/2021 Cycle 2 weekly Taxol/carboplatin 01/15/2021 Treatment held 01/22/2021 due to neutropenia Positive COVID test 02/02/2021 Taxol/carboplatin 02/15/2021 Endoscopy 04/30/2021-shallow ulceration at the distal esophagus, nodularity at the gastric cardia-biopsies negative for malignancy Solid dysphagia secondary #1, resolved COPD on chest CT 12/14/2020 Hemorrhoids Hyperlipidemia Irritable bowel syndrome Hypothyroidism Tobacco use  Disposition: Alison Garner remains in clinical remission from esophagus cancer.  She will contact us and Dr. Hilarie Fredrickson for increased dysphagia.  She will return for an office visit in 3 months.  Betsy Coder, MD  08/06/2021  2:44 PM

## 2021-08-27 ENCOUNTER — Other Ambulatory Visit: Payer: Self-pay

## 2021-09-04 ENCOUNTER — Other Ambulatory Visit: Payer: Self-pay

## 2021-10-24 ENCOUNTER — Other Ambulatory Visit: Payer: Self-pay | Admitting: Internal Medicine

## 2021-11-06 ENCOUNTER — Inpatient Hospital Stay: Payer: Medicare Other | Admitting: Oncology

## 2021-11-19 ENCOUNTER — Telehealth: Payer: Medicare Other | Admitting: Internal Medicine

## 2021-11-19 ENCOUNTER — Inpatient Hospital Stay: Payer: Medicare Other | Attending: Radiation Oncology | Admitting: Oncology

## 2021-11-19 VITALS — BP 119/65 | HR 98 | Temp 98.1°F | Resp 18 | Ht 65.0 in | Wt 136.2 lb

## 2021-11-19 DIAGNOSIS — F17200 Nicotine dependence, unspecified, uncomplicated: Secondary | ICD-10-CM | POA: Insufficient documentation

## 2021-11-19 DIAGNOSIS — C155 Malignant neoplasm of lower third of esophagus: Secondary | ICD-10-CM | POA: Insufficient documentation

## 2021-11-19 DIAGNOSIS — J449 Chronic obstructive pulmonary disease, unspecified: Secondary | ICD-10-CM | POA: Diagnosis not present

## 2021-11-19 DIAGNOSIS — E785 Hyperlipidemia, unspecified: Secondary | ICD-10-CM | POA: Insufficient documentation

## 2021-11-19 DIAGNOSIS — K589 Irritable bowel syndrome without diarrhea: Secondary | ICD-10-CM | POA: Insufficient documentation

## 2021-11-19 DIAGNOSIS — E039 Hypothyroidism, unspecified: Secondary | ICD-10-CM | POA: Insufficient documentation

## 2021-11-19 DIAGNOSIS — K649 Unspecified hemorrhoids: Secondary | ICD-10-CM | POA: Diagnosis not present

## 2021-11-19 NOTE — Telephone Encounter (Signed)
Patient states that she received a notice from [employer] that her pantoprazole prior authorization would be expiring on 12/22/21 and would like [place] to go ahead and get repeat authorizaton.

## 2021-11-19 NOTE — Telephone Encounter (Signed)
Inbound call from patient. Please give a call back to further advise.  Thank you  

## 2021-11-19 NOTE — Progress Notes (Signed)
  [facility] OFFICE PROGRESS NOTE   Diagnosis: Esophagus cancer  INTERVAL HISTORY:   Alison Garner returns for a scheduled visit.  She has a good appetite.  Her weight has returned to the preesophagus cancer baseline.  She has dysphagia with some pills and when eating a large meal.  Otherwise no dysphagia.  She has symptoms from seasonal allergies at present.  She now has a cough and chest congestion.  She continues smoking.  Objective:  Vital signs in last 24 hours:  Blood pressure 119/65, pulse 98, temperature 98.1 F (36.7 C), temperature source Oral, resp. rate 18, height _0  (1.651 m), weight 136 lb 3.2 oz (61.8 kg), SpO2 100 %.    HEENT: Neck without mass Lymphatics: No cervical, supraclavicular, axillary, or inguinal nodes Resp: Lungs with distant breath sounds, mild bilateral end expiratory wheeze/rhonchi, no respiratory distress Cardio: Regular rate and rhythm GI: No hepatosplenomegaly, no mass, nontender Vascular: No leg edema   Lab Results:  Lab Results  Component Value Date   WBC 5.2 03/27/2021   HGB 12.5 03/27/2021   HCT 37.1 03/27/2021   MCV 96.1 03/27/2021   PLT 183 03/27/2021   NEUTROABS 3.5 03/27/2021    CMP  Lab Results  Component Value Date   NA 133 (L) 03/27/2021   K 3.5 03/27/2021   CL 96 (L) 03/27/2021   CO2 29 03/27/2021   GLUCOSE 155 (H) 03/27/2021   BUN 11 03/27/2021   CREATININE 1.03 (H) 03/27/2021   CALCIUM 9.8 03/27/2021   PROT 6.5 03/27/2021   ALBUMIN 4.0 03/27/2021   AST 18 03/27/2021   ALT 17 03/27/2021   ALKPHOS 67 03/27/2021   BILITOT 0.5 03/27/2021   GFRNONAA 57 (L) 03/27/2021    Medications: I have reviewed the patient's current medications.   Assessment/Plan: Distal esophagus cancer Esophagram 11/21/2020-moderate to severe esophageal dysmotility, narrowing of the distal esophagus just above the GE junction to 4 mm, 13 mm barium tablet stuck in the distal esophagus Upper endoscopy 12/13/2020-severe  esophagitis with nodularity and stricturing at 36 cm-adenocarcinoma, HER2 negative, no loss of mismatch repair protein expression, PD-L1 combined positive score less than 1% CT chest 12/14/2020-accentuated density at the distal esophageal lumen above a small type I hiatal hernia concerning for malignancy, nonpathologically enlarged AP window and right hilar nodes PET 12/21/2020-abnormal FDG uptake at the distal esophagus, no evidence of nodal or distant metastatic disease Radiation 01/08/2021-02/16/2021 Cycle 1 weekly Taxol/carboplatin 01/08/2021 Cycle 2 weekly Taxol/carboplatin 01/15/2021 Treatment held 01/22/2021 due to neutropenia Positive COVID test 02/02/2021 Taxol/carboplatin 02/15/2021 Endoscopy 04/30/2021-shallow ulceration at the distal esophagus, nodularity at the gastric cardia-biopsies negative for malignancy Solid dysphagia secondary #1, resolved COPD on chest CT 12/14/2020 Hemorrhoids Hyperlipidemia Irritable bowel syndrome Hypothyroidism Tobacco use    Disposition: Alison Garner is in clinical remission from esophagus cancer.  She will call for increased dysphagia or new symptoms.  Her current respiratory symptoms are likely related to seasonal allergies, COPD, or an upper respiratory infection.  She will her primary provider if the symptoms do not resolve.  She will return for an office visit in 4 months.  She declines influenza and COVID-19 vaccines.  Betsy Coder, MD  11/19/2021  3:41 PM

## 2021-11-30 ENCOUNTER — Other Ambulatory Visit (HOSPITAL_COMMUNITY): Payer: Self-pay

## 2021-11-30 NOTE — Telephone Encounter (Signed)
[place] to process this PA via CoverMyMeds. Will need to call [facility] directly at [phone]. Will continue to follow.

## 2021-12-03 ENCOUNTER — Other Ambulatory Visit (HOSPITAL_COMMUNITY): Payer: Self-pay

## 2021-12-03 NOTE — Telephone Encounter (Signed)
Patient Advocate Encounter  Prior Authorization for PANTOPRAZOLE '40MG'$  has been approved.    PA# --- Effective dates: 09.30.23 through 10.29.24  Britni Driscoll B. CPhT P: [phone] F: [phone]   Received notification from [facility] that prior authorization for PANTOPRAZOLE '40MG'$  is required.   PA submitted on 10.30.23 SUBMITTED VIA PHONE [phone] Status is pending    Luciano Cutter, CPhT Patient Advocate Phone: [phone]

## 2022-02-14 ENCOUNTER — Other Ambulatory Visit: Payer: Self-pay | Admitting: Internal Medicine

## 2022-03-14 ENCOUNTER — Ambulatory Visit (INDEPENDENT_AMBULATORY_CARE_PROVIDER_SITE_OTHER): Payer: Medicare Other | Admitting: Internal Medicine

## 2022-03-14 ENCOUNTER — Encounter: Payer: Self-pay | Admitting: Internal Medicine

## 2022-03-14 VITALS — BP 120/68 | HR 100 | Ht 65.0 in | Wt 141.4 lb

## 2022-03-14 DIAGNOSIS — K219 Gastro-esophageal reflux disease without esophagitis: Secondary | ICD-10-CM | POA: Diagnosis not present

## 2022-03-14 DIAGNOSIS — K580 Irritable bowel syndrome with diarrhea: Secondary | ICD-10-CM | POA: Diagnosis not present

## 2022-03-14 DIAGNOSIS — K224 Dyskinesia of esophagus: Secondary | ICD-10-CM | POA: Diagnosis not present

## 2022-03-14 DIAGNOSIS — Z8501 Personal history of malignant neoplasm of esophagus: Secondary | ICD-10-CM

## 2022-03-14 NOTE — Progress Notes (Signed)
   Subjective:    Patient ID: Alison Garner, female    DOB: 12-20-1950, 72 y.o.   MRN: [id number]  HPI Ludmila Ebarb is a 72 year old female with a history of esophageal adenocarcinoma treated with radiation and chemotherapy, IBS with loose stools, diverticulosis, hypothyroidism, hyperlipidemia and kidney stones who is here for follow-up.  She is here alone today.  I last saw her for surveillance upper endoscopy on 04/30/2021.  She follows with Dr. Benay Spice and is doing well.  He will see her again next week.  She continues pantoprazole 40 mg twice daily. She is having no solid food dysphagia and odynophagia.  However in the morning with her first liquid and levothyroxine she can occasionally 1 or 2 days a week feel that the water "bubbles" in the pill may stick a little.  She hesitates a little with swallowing thin liquids only.  No abdominal pain though she has had 2 episodes of lightninglike pain in the left upper quadrant and then several other episodes of a more dull achy discomfort.  This is almost like a "stinging sensation".  Nothing persistent or recurrent or reproducible.  Intermittent loose stools related to her IBS.  No blood in stool or melena.  Good appetite and she has been able to gain weight.  No regular early satiety   Review of Systems As per HPI, otherwise negative  Current Medications, Allergies, Past Medical History, Past Surgical History, Family History and Social History were reviewed in [employer] record.     Objective:   Physical Exam BP 120/68 (BP Location: Left Arm, Patient Position: Sitting, Cuff Size: Normal)   Pulse 100   Ht '5\' 5"'$  (1.651 m)   Wt 141 lb 6 oz (64.1 kg)   BMI 23.53 kg/m  Gen: awake, alert, NAD HEENT: anicteric  CV: RRR, no mrg Pulm: CTA b/l Abd: soft, NT/ND, +BS throughout Ext: no c/c/e Neuro: nonfocal       Assessment & Plan:  72 year old female with a history of esophageal adenocarcinoma treated with  radiation and chemotherapy, IBS with loose stools, diverticulosis, hypothyroidism, hyperlipidemia and kidney stones who is here for follow-up.    History of esophageal adenocarcinoma/GERD/liquid dysphagia --her liquid dysphagia is consistent with dysmotility and not stricture.  She has no solid food dysphagia.  She has done quite well. -- Follow-up with Dr. Benay Spice next week; surveillance imaging to be considered -- Surveillance upper endoscopy in March; we discussed the risk, benefits and alternatives and she is agreeable and wishes to proceed -- Continue pantoprazole 40 mg twice daily AC  2.  IBS-D --intermittent and stable.  Does not need regular treatment.  3.  Colon cancer screening --no polyps a colonoscopy in 2017; consider repeat in 2027

## 2022-03-14 NOTE — Patient Instructions (Signed)
You have been scheduled for an endoscopy. Please follow written instructions given to you at your visit today. If you use inhalers (even only as needed), please bring them with you on the day of your procedure.  Continue pantoprazole 40 mg twice daily.  _______________________________________________________  If your blood pressure at your visit was 140/90 or greater, please contact your primary care physician to follow up on this.  _______________________________________________________  If you are age 25 or older, your body mass index should be between 23-30. Your Body mass index is 23.53 kg/m. If this is out of the aforementioned range listed, please consider follow up with your Primary Care Provider.  If you are age 60 or younger, your body mass index should be between 19-25. Your Body mass index is 23.53 kg/m. If this is out of the aformentioned range listed, please consider follow up with your Primary Care Provider.   ________________________________________________________  The [facility] providers would like to encourage you to use [facility] to communicate with providers for non-urgent requests or questions.  Due to long hold times on the telephone, sending your provider a message by [facility] may be a faster and more efficient way to get a response.  Please allow 48 business hours for a response.  Please remember that this is for non-urgent requests.  _______________________________________________________  Due to recent changes in healthcare laws, you may see the results of your imaging and laboratory studies on MyChart before your provider has had a chance to review them.  We understand that in some cases there may be results that are confusing or concerning to you. Not all laboratory results come back in the same time frame and the provider may be waiting for multiple results in order to interpret others.  Please give [place] 48 hours in order for your provider to thoroughly review all the  results before contacting the office for clarification of your results.

## 2022-03-19 ENCOUNTER — Inpatient Hospital Stay: Payer: Medicare Other | Attending: Oncology | Admitting: Oncology

## 2022-03-19 VITALS — BP 121/60 | HR 108 | Temp 97.9°F | Resp 18 | Ht 65.0 in | Wt 142.0 lb

## 2022-03-19 DIAGNOSIS — I700 Atherosclerosis of aorta: Secondary | ICD-10-CM | POA: Insufficient documentation

## 2022-03-19 DIAGNOSIS — E039 Hypothyroidism, unspecified: Secondary | ICD-10-CM | POA: Insufficient documentation

## 2022-03-19 DIAGNOSIS — C155 Malignant neoplasm of lower third of esophagus: Secondary | ICD-10-CM | POA: Diagnosis not present

## 2022-03-19 DIAGNOSIS — J449 Chronic obstructive pulmonary disease, unspecified: Secondary | ICD-10-CM | POA: Insufficient documentation

## 2022-03-19 DIAGNOSIS — K589 Irritable bowel syndrome without diarrhea: Secondary | ICD-10-CM | POA: Diagnosis not present

## 2022-03-19 DIAGNOSIS — K649 Unspecified hemorrhoids: Secondary | ICD-10-CM | POA: Diagnosis not present

## 2022-03-19 DIAGNOSIS — F17200 Nicotine dependence, unspecified, uncomplicated: Secondary | ICD-10-CM | POA: Insufficient documentation

## 2022-03-19 DIAGNOSIS — E785 Hyperlipidemia, unspecified: Secondary | ICD-10-CM | POA: Diagnosis not present

## 2022-03-19 DIAGNOSIS — K5730 Diverticulosis of large intestine without perforation or abscess without bleeding: Secondary | ICD-10-CM | POA: Diagnosis not present

## 2022-03-19 NOTE — Progress Notes (Signed)
  [facility] OFFICE PROGRESS NOTE   Diagnosis: Esophagus cancer  INTERVAL HISTORY:   [name] returns as scheduled.  She reports morning pill dysphagia that improved when she drank increase liquids.  No other dysphagia.  She feels well.  Good appetite.  She continues smoking.  Objective:  Vital signs in last 24 hours:  Blood pressure 121/60, pulse (!) 108, temperature 97.9 F (36.6 C), temperature source Oral, resp. rate 18, height '5\' 5"'$  (1.651 m), weight 142 lb (64.4 kg), SpO2 98 %.     Lymphatics: No cervical, supraclavicular, axillary, or inguinal nodes Resp: Distant breath sounds, no respiratory distress GI: Hepatosplenomegaly, nontender Cardiac: Regular rate and rhythm Vascular: No leg edema  Lab Results:  Lab Results  Component Value Date   WBC 5.2 03/27/2021   HGB 12.5 03/27/2021   HCT 37.1 03/27/2021   MCV 96.1 03/27/2021   PLT 183 03/27/2021   NEUTROABS 3.5 03/27/2021    CMP  Lab Results  Component Value Date   NA 133 (L) 03/27/2021   K 3.5 03/27/2021   CL 96 (L) 03/27/2021   CO2 29 03/27/2021   GLUCOSE 155 (H) 03/27/2021   BUN 11 03/27/2021   CREATININE 1.03 (H) 03/27/2021   CALCIUM 9.8 03/27/2021   PROT 6.5 03/27/2021   ALBUMIN 4.0 03/27/2021   AST 18 03/27/2021   ALT 17 03/27/2021   ALKPHOS 67 03/27/2021   BILITOT 0.5 03/27/2021   GFRNONAA 57 (L) 03/27/2021     Medications: I have reviewed the patient's current medications.   Assessment/Plan: Distal esophagus cancer Esophagram 11/21/2020-moderate to severe esophageal dysmotility, narrowing of the distal esophagus just above the GE junction to 4 mm, 13 mm barium tablet stuck in the distal esophagus Upper endoscopy 12/13/2020-severe esophagitis with nodularity and stricturing at 36 cm-adenocarcinoma, HER2 negative, no loss of mismatch repair protein expression, PD-L1 combined positive score less than 1% CT chest 12/14/2020-accentuated density at the distal esophageal lumen  above a small type I hiatal hernia concerning for malignancy, nonpathologically enlarged AP window and right hilar nodes PET 12/21/2020-abnormal FDG uptake at the distal esophagus, no evidence of nodal or distant metastatic disease Radiation 01/08/2021-02/16/2021 Cycle 1 weekly Taxol/carboplatin 01/08/2021 Cycle 2 weekly Taxol/carboplatin 01/15/2021 Treatment held 01/22/2021 due to neutropenia Positive COVID test 02/02/2021 Taxol/carboplatin 02/15/2021 Endoscopy 04/30/2021-shallow ulceration at the distal esophagus, nodularity at the gastric cardia-biopsies negative for malignancy Solid dysphagia secondary #1, resolved COPD on chest CT 12/14/2020 Hemorrhoids Hyperlipidemia Irritable bowel syndrome Hypothyroidism Tobacco use      Disposition: Alison Garner is in clinical remission from esophagus cancer.  She is scheduled for a surveillance endoscopy with Dr. Hilarie Fredrickson next month.  She would like to undergo surveillance CTs.  We discussed CT imaging.  She will have IV contrast only since the esophagus and stomach will be evaluated endoscopically.  She understands the risk of renal toxicity with IV contrast.  Will check a chemistry panel on the day of the CT.  [name] will return for an office visit in 6 months.  Betsy Coder, MD  03/19/2022  3:42 PM

## 2022-04-02 ENCOUNTER — Encounter (HOSPITAL_BASED_OUTPATIENT_CLINIC_OR_DEPARTMENT_OTHER): Payer: Self-pay

## 2022-04-02 ENCOUNTER — Ambulatory Visit (HOSPITAL_BASED_OUTPATIENT_CLINIC_OR_DEPARTMENT_OTHER)
Admission: RE | Admit: 2022-04-02 | Discharge: 2022-04-02 | Disposition: A | Discharge status: Home / Self Care | Payer: Medicare Other | Source: Ambulatory Visit | Attending: Oncology | Admitting: Oncology

## 2022-04-02 ENCOUNTER — Inpatient Hospital Stay: Payer: Medicare Other

## 2022-04-02 DIAGNOSIS — C155 Malignant neoplasm of lower third of esophagus: Secondary | ICD-10-CM | POA: Insufficient documentation

## 2022-04-02 LAB — BASIC METABOLIC PANEL - CANCER CENTER ONLY
Anion gap: 9 (ref 5–15)
BUN: 7 mg/dL — ABNORMAL LOW (ref 8–23)
CO2: 26 mmol/L (ref 22–32)
Calcium: 9.9 mg/dL (ref 8.9–10.3)
Chloride: 99 mmol/L (ref 98–111)
Creatinine: 1.07 mg/dL — ABNORMAL HIGH (ref 0.44–1.00)
GFR, Estimated: 55 mL/min — ABNORMAL LOW (ref >60)
Glucose, Bld: 134 mg/dL — ABNORMAL HIGH (ref 70–99)
Potassium: 4.3 mmol/L (ref 3.5–5.1)
Sodium: 134 mmol/L — ABNORMAL LOW (ref 135–145)

## 2022-04-02 MED ORDER — IOHEXOL 300 MG/ML  SOLN
100.0000 mL | Freq: Once | INTRAMUSCULAR | Status: AC | PRN
  Administered 2022-04-02: 85 mL via INTRAVENOUS

## 2022-04-03 ENCOUNTER — Telehealth: Payer: Self-pay

## 2022-04-03 NOTE — Telephone Encounter (Signed)
-----   Message from Ladell Pier, MD sent at 04/03/2022  7:58 AM EST ----- Please call patient, CT showed no evidence of recurrent esophagus cancer Please ask radiology to fix the CT report, I believe #2 under CT abdomen pelvis should read "No"developing mass lesion

## 2022-04-03 NOTE — Telephone Encounter (Signed)
Patient gave verbal understanding and had no further questions or concerns. I called the RR to due the changing Per Dr. Benay Spice.

## 2022-04-15 ENCOUNTER — Other Ambulatory Visit: Payer: Self-pay | Admitting: *Deleted

## 2022-04-15 ENCOUNTER — Encounter: Payer: Self-pay | Admitting: Internal Medicine

## 2022-04-15 ENCOUNTER — Ambulatory Visit (AMBULATORY_SURGERY_CENTER): Payer: Medicare Other | Admitting: Internal Medicine

## 2022-04-15 VITALS — BP 156/72 | HR 70 | Temp 98.9°F | Resp 14 | Ht 65.0 in | Wt 141.0 lb

## 2022-04-15 DIAGNOSIS — Z8501 Personal history of malignant neoplasm of esophagus: Secondary | ICD-10-CM

## 2022-04-15 DIAGNOSIS — C155 Malignant neoplasm of lower third of esophagus: Secondary | ICD-10-CM

## 2022-04-15 DIAGNOSIS — Z08 Encounter for follow-up examination after completed treatment for malignant neoplasm: Secondary | ICD-10-CM

## 2022-04-15 DIAGNOSIS — C160 Malignant neoplasm of cardia: Secondary | ICD-10-CM

## 2022-04-15 MED ORDER — PANTOPRAZOLE SODIUM 40 MG PO TBEC: 40.0000 mg | DELAYED_RELEASE_TABLET | Freq: Every day | ORAL | 4 refills | Status: DC

## 2022-04-15 MED ORDER — SODIUM CHLORIDE 0.9 % IV SOLN: 500.0000 mL | Freq: Once | INTRAVENOUS | Status: DC

## 2022-04-15 NOTE — Patient Instructions (Addendum)
- Resume previous diet.                           - Continue present medications.                           - Await pathology results.                           - Repeat upper endoscopy for surveillance based on                            pathology results.   YOU HAD AN ENDOSCOPIC PROCEDURE TODAY AT [facility]:   Refer to the procedure report that was given to you for any specific questions about what was found during the examination.  If the procedure report does not answer your questions, please call your gastroenterologist to clarify.  If you requested that your care partner not be given the details of your procedure findings, then the procedure report has been included in a sealed envelope for you to review at your convenience later.  YOU SHOULD EXPECT: Some feelings of bloating in the abdomen. Passage of more gas than usual.  Walking can help get rid of the air that was put into your GI tract during the procedure and reduce the bloating. If you had a lower endoscopy (such as a colonoscopy or flexible sigmoidoscopy) you may notice spotting of blood in your stool or on the toilet paper. If you underwent a bowel prep for your procedure, you may not have a normal bowel movement for a few days.  Please Note:  You might notice some irritation and congestion in your nose or some drainage.  This is from the oxygen used during your procedure.  There is no need for concern and it should clear up in a day or so.  SYMPTOMS TO REPORT IMMEDIATELY:   Following upper endoscopy (EGD)  Vomiting of blood or coffee ground material  New chest pain or pain under the shoulder blades  Painful or persistently difficult swallowing  New shortness of breath  Fever of 100F or higher  Black, tarry-looking stools  For urgent or emergent issues, a gastroenterologist can be reached at any hour by calling [phone]. Do not use MyChart messaging for urgent concerns.     DIET:  We do recommend a small meal at first, but then you may proceed to your regular diet.  Drink plenty of fluids but you should avoid alcoholic beverages for 24 hours.  ACTIVITY:  You should plan to take it easy for the rest of today and you should NOT DRIVE or use heavy machinery until tomorrow (because of the sedation medicines used during the test).    FOLLOW UP: Our staff will call the number listed on your records the next business day following your procedure.  We will call around 7:15- 8:00 am to check on you and address any questions or concerns that you may have regarding the information given to you following your procedure. If we do not reach you, we will leave a message.     If any biopsies were taken you will be contacted by phone or by letter within the next 1-3 weeks.  Please call us at [phone] if you have not heard about the biopsies in 3 weeks.  SIGNATURES/CONFIDENTIALITY: You and/or your care partner have signed paperwork which will be entered into your electronic medical record.  These signatures attest to the fact that that the information above on your After Visit Summary has been reviewed and is understood.  Full responsibility of the confidentiality of this discharge information lies with you and/or your care-partner.

## 2022-04-15 NOTE — Progress Notes (Signed)
Vitals-DT  Pt's states no medical or surgical changes since previsit or office visit.  

## 2022-04-15 NOTE — Op Note (Signed)
[facility] Patient Name: Alison Garner Procedure Date: 04/15/2022 10:29 AM MRN: [id number] Endoscopist: Jerene Bears , MD, [id number] Age: 72 Referring MD:  Date of Birth: 1950/07/07 Gender: Female Account #: [id number] Procedure:                Upper GI endoscopy Indications:              Personal history of malignant esophageal neoplasm;                            esophageal adenocarcinoma diagnosis Nov 2022, last                            surveillance EGD 1 year ago Medicines:                Monitored Anesthesia Care Procedure:                Pre-Anesthesia Assessment:                           - Prior to the procedure, a History and Physical                            was performed, and patient medications and                            allergies were reviewed. The patient's tolerance of                            previous anesthesia was also reviewed. The risks                            and benefits of the procedure and the sedation                            options and risks were discussed with the patient.                            All questions were answered, and informed consent                            was obtained. Prior Anticoagulants: The patient has                            taken no anticoagulant or antiplatelet agents. ASA                            Grade Assessment: III - A patient with severe                            systemic disease. After reviewing the risks and                            benefits, the patient was deemed in satisfactory  condition to undergo the procedure.                           After obtaining informed consent, the endoscope was                            passed under direct vision. Throughout the                            procedure, the patient's blood pressure, pulse, and                            oxygen saturations were monitored continuously. The                            GIF [id number] was  introduced through the                            mouth, and advanced to the second part of duodenum.                            The upper GI endoscopy was accomplished without                            difficulty. The patient tolerated the procedure                            well. Scope In: Scope Out: Findings:                 In the proximal esophagus there were small inlet                            patches without nodularity or visible abnormality.                           One tongue of salmon-colored mucosa was present                            from 38 to 39 cm. The maximum longitudinal extent                            of these esophageal mucosal changes was 1 cm in                            length. Biopsies were taken with a cold forceps for                            histology.                           Post treatment scarring was found in the very                            distal esophagus and at  the gastroesophageal                            junction, 39 to 40 cm from the incisors.                           Mucosal changes characterized by nodularity were                            found in the cardia (immediately below the Z-line).                            Biopsies were taken with a cold forceps for                            histology.                           A 2 cm hiatal hernia was present.                           The exam of the stomach was otherwise normal.                           The examined duodenum was normal. Complications:            No immediate complications. Estimated Blood Loss:     Estimated blood loss was minimal. Impression:               - Short segment salmon-colored mucosa (one 1 cm                            tongue above the Z-line). Biopsied.                           - Scarring (expected) at the gastroesophageal                            junction/Z-line with non-obstructing narrowing.                           - Nodular mucosa in the  gastric cardia immediately                            distal to the Z-line. Biopsied.                           - 2 cm hiatal hernia.                           - Normal examined duodenum. Recommendation:           - Patient has a contact number available for                            emergencies. The signs and symptoms of potential  delayed complications were discussed with the                            patient. Return to normal activities tomorrow.                            Written discharge instructions were provided to the                            patient.                           - Resume previous diet.                           - Continue present medications.                           - Await pathology results.                           - Repeat upper endoscopy for surveillance based on                            pathology results. Jerene Bears, MD 04/15/2022 11:01:08 AM This report has been signed electronically.

## 2022-04-15 NOTE — Progress Notes (Signed)
GASTROENTEROLOGY PROCEDURE H&P NOTE   Primary Care Physician: [name], MD    Reason for Procedure:  History of esophageal cancer  Plan:    Upper endoscopy  Patient is appropriate for endoscopic procedure(s) in the ambulatory ([facility]) setting.  The nature of the procedure, as well as the risks, benefits, and alternatives were carefully and thoroughly reviewed with the patient. Ample time for discussion and questions allowed. The patient understood, was satisfied, and agreed to proceed.     HPI: Alison Garner is a 72 y.o. female who presents for upper endoscopy.  Medical history as below.  No recent chest pain or shortness of breath.  No abdominal pain today.  Past Medical History:  Diagnosis Date   Arthritis    Carotid bruit    Diverticulosis    Esophageal cancer ([facility])    External hemorrhoids    Hiatal hernia    History of radiation therapy    Esophagus- 01/08/21-02/16/21- Dr. Gery Pray   Hyperlipidemia    Hypothyroidism    Internal hemorrhoids    Irritable bowel syndrome    Kidney infection    UTI hx   Kidney stones    Lichen planus    Macular cyst, hole, or pseudohole, unspecified eye    Thyroid disease    thyroid nodule   Vitamin D deficiency     Past Surgical History:  Procedure Laterality Date   COLONOSCOPY  2017   JMP-MAC-good prep-tics/hems/normal colon   DILATION AND CURETTAGE OF UTERUS  1982   UPPER GASTROINTESTINAL ENDOSCOPY  12/2020    Prior to Admission medications   Medication Sig Start Date End Date Taking? Authorizing Provider  B COMPLEX-C PO Take 1 Dose by mouth daily at 6 (six) AM. B total drops- liquid vitamin b   Yes [provider]  Cholecalciferol (VITAMIN D3) 50 MCG (2000 UT) CAPS Take 2,000 Units by mouth 2 (two) times daily.   Yes [provider]  ezetimibe-simvastatin (VYTORIN) 10-20 MG tablet Take 1 tablet by mouth every other day.   Yes [provider]  imipramine (TOFRANIL) 25 MG tablet  Take 25 mg by mouth daily. Takes 2 tablets in am   Yes [provider]  levothyroxine (SYNTHROID, LEVOTHROID) 25 MCG tablet at bedtime. 08/02/17  Yes [provider]  pantoprazole (PROTONIX) 40 MG tablet TAKE 1 TABLET (40 MG) BY MOUTH TWICE DAILY 02/14/22  Yes Gary Gabrielsen, Lajuan Lines, MD    Current Outpatient Medications  Medication Sig Dispense Refill   B COMPLEX-C PO Take 1 Dose by mouth daily at 6 (six) AM. B total drops- liquid vitamin b     Cholecalciferol (VITAMIN D3) 50 MCG (2000 UT) CAPS Take 2,000 Units by mouth 2 (two) times daily.     ezetimibe-simvastatin (VYTORIN) 10-20 MG tablet Take 1 tablet by mouth every other day.     imipramine (TOFRANIL) 25 MG tablet Take 25 mg by mouth daily. Takes 2 tablets in am     levothyroxine (SYNTHROID, LEVOTHROID) 25 MCG tablet at bedtime.     pantoprazole (PROTONIX) 40 MG tablet TAKE 1 TABLET (40 MG) BY MOUTH TWICE DAILY 180 tablet 0   Current Facility-Administered Medications  Medication Dose Route Frequency Provider Last Rate Last Admin   0.9 %  sodium chloride infusion  500 mL Intravenous Once Tonio Seider, Lajuan Lines, MD       0.9 %  sodium chloride infusion  500 mL Intravenous Once Hazaiah Edgecombe, Lajuan Lines, MD        Allergies  as of 04/15/2022 - Review Complete 04/15/2022  Allergen Reaction Noted   Bactrim [sulfamethoxazole-trimethoprim] Rash 09/07/2015    Family History  Problem Relation Age of Onset   Heart disease Mother    Hypertension Mother    Heart disease Father    Heart failure Father    Thyroid disease Brother    Heart disease Maternal Aunt    Other Maternal Aunt        MDS   Heart disease Maternal Uncle    Heart failure Maternal Uncle    Myasthenia gravis Paternal Aunt    Heart disease Paternal Uncle    Heart failure Paternal Uncle    Colon cancer Neg Hx    Esophageal cancer Neg Hx    Stomach cancer Neg Hx    Rectal cancer Neg Hx    Colon polyps Neg Hx     Social History   Socioeconomic History   Marital status: Single     Spouse name: Not on file   Number of children: Not on file   Years of education: Not on file   Highest education level: Not on file  Occupational History   Not on file  Tobacco Use   Smoking status: Every Day    Packs/day: 0.50    Years: 50.00    Total pack years: 25.00    Types: Cigarettes   Smokeless tobacco: Never  Vaping Use   Vaping Use: Never used  Substance and Sexual Activity   Alcohol use: Yes    Comment: rare   Drug use: No   Sexual activity: Not on file  Other Topics Concern   Not on file  Social History Narrative   Not on file   Social Determinants of Health   Financial Resource Strain: Not on file  Food Insecurity: Not on file  Transportation Needs: Not on file  Physical Activity: Not on file  Stress: Not on file  Social Connections: Not on file  Intimate Partner Violence: Not on file    Physical Exam: Vital signs in last 24 hours: '@BP'$  136/62 (BP Location: Right Arm, Patient Position: Sitting, Cuff Size: Normal)   Pulse 87   Temp 98.9 F (37.2 C) (Temporal)   Ht '5\' 5"'$  (1.651 m)   Wt 141 lb (64 kg)   SpO2 99%   BMI 23.46 kg/m  GEN: NAD EYE: Sclerae anicteric ENT: MMM CV: Non-tachycardic Pulm: CTA b/l GI: Soft, NT/ND NEURO:  Alert & Oriented x 3   Zenovia Jarred, MD [facility]  04/15/2022 10:32 AM

## 2022-04-16 ENCOUNTER — Telehealth: Payer: Self-pay | Admitting: *Deleted

## 2022-04-16 NOTE — Telephone Encounter (Signed)
  Follow up Call-     04/15/2022    9:42 AM 04/30/2021    9:32 AM 12/13/2020   11:11 AM  Call back number  Post procedure Call Back phone  # [phone]  Permission to leave phone message Yes Yes Yes     Patient questions:  Do you have a fever, pain , or abdominal swelling? No. Pain Score  0 *  Have you tolerated food without any problems? Yes.    Have you been able to return to your normal activities? Yes.    Do you have any questions about your discharge instructions: Diet   No. Medications  No. Follow up visit  No.  Do you have questions or concerns about your Care? No.  Actions: * If pain score is 4 or above: No action needed, pain <4.

## 2022-04-17 LAB — SURGICAL PATHOLOGY

## 2022-04-18 ENCOUNTER — Telehealth: Payer: Self-pay | Admitting: Internal Medicine

## 2022-04-18 NOTE — Telephone Encounter (Signed)
PT daughter is calling to request a confirmation for diagnosis from Dr. Vic Ripper. Please advise.

## 2022-04-19 ENCOUNTER — Encounter: Payer: Self-pay | Admitting: Internal Medicine

## 2022-04-19 NOTE — Telephone Encounter (Signed)
Pt daughter Jinny Blossom  stated that she is requesting a "reread" from Dr. Vic Ripper. Jinny Blossom stated that she was going to send Dr. Hilarie Fredrickson a my chart message stating in details what she is requesting. Pt was notified that we will ensure that Dr. Hilarie Fredrickson receives the message.  Megan  verbalized understanding with all questions answered.

## 2022-04-23 ENCOUNTER — Encounter: Payer: Self-pay | Admitting: Nurse Practitioner

## 2022-04-23 ENCOUNTER — Inpatient Hospital Stay: Payer: Medicare Other | Attending: Oncology | Admitting: Nurse Practitioner

## 2022-04-23 VITALS — BP 125/61 | HR 100 | Temp 98.1°F | Resp 18 | Ht 65.0 in | Wt 142.6 lb

## 2022-04-23 DIAGNOSIS — J449 Chronic obstructive pulmonary disease, unspecified: Secondary | ICD-10-CM | POA: Insufficient documentation

## 2022-04-23 DIAGNOSIS — E039 Hypothyroidism, unspecified: Secondary | ICD-10-CM | POA: Insufficient documentation

## 2022-04-23 DIAGNOSIS — K589 Irritable bowel syndrome without diarrhea: Secondary | ICD-10-CM | POA: Insufficient documentation

## 2022-04-23 DIAGNOSIS — E785 Hyperlipidemia, unspecified: Secondary | ICD-10-CM

## 2022-04-23 DIAGNOSIS — R0609 Other forms of dyspnea: Secondary | ICD-10-CM | POA: Insufficient documentation

## 2022-04-23 DIAGNOSIS — K649 Unspecified hemorrhoids: Secondary | ICD-10-CM | POA: Diagnosis not present

## 2022-04-23 DIAGNOSIS — C155 Malignant neoplasm of lower third of esophagus: Secondary | ICD-10-CM | POA: Diagnosis present

## 2022-04-23 DIAGNOSIS — C160 Malignant neoplasm of cardia: Secondary | ICD-10-CM

## 2022-04-23 NOTE — Progress Notes (Signed)
[facility] OFFICE PROGRESS NOTE   Diagnosis: Esophagus cancer  INTERVAL HISTORY:   Alison Garner returns for follow-up.  She feels well.  No dysphagia.  She has a good appetite.  No change in baseline bowel habits.  Stable dyspnea on exertion.  Objective:  Vital signs in last 24 hours:  Blood pressure 125/61, pulse 100, temperature 98.1 F (36.7 C), temperature source Oral, resp. rate 18, height 5\' 5"  (1.651 m), weight 142 lb 9.6 oz (64.7 kg), SpO2 100 %.    HEENT: No thrush or ulcers. Lymphatics: No palpable cervical, supraclavicular, axillary or inguinal lymph nodes. Resp: Distant breath sounds.  No respiratory distress. Cardio: Regular rate and rhythm. GI: Abdomen soft and nontender.  No hepatosplenomegaly. Vascular: No leg edema.   Lab Results:  Lab Results  Component Value Date   WBC 5.2 03/27/2021   HGB 12.5 03/27/2021   HCT 37.1 03/27/2021   MCV 96.1 03/27/2021   PLT 183 03/27/2021   NEUTROABS 3.5 03/27/2021    Imaging:  No results found.  Medications: I have reviewed the patient's current medications.  Assessment/Plan: Distal esophagus cancer Esophagram 11/21/2020-moderate to severe esophageal dysmotility, narrowing of the distal esophagus just above the GE junction to 4 mm, 13 mm barium tablet stuck in the distal esophagus Upper endoscopy 12/13/2020-severe esophagitis with nodularity and stricturing at 36 cm-adenocarcinoma, HER2 negative, no loss of mismatch repair protein expression, PD-L1 combined positive score less than 1% CT chest 12/14/2020-accentuated density at the distal esophageal lumen above a small type I hiatal hernia concerning for malignancy, nonpathologically enlarged AP window and right hilar nodes PET 12/21/2020-abnormal FDG uptake at the distal esophagus, no evidence of nodal or distant metastatic disease Radiation 01/08/2021-02/16/2021 Cycle 1 weekly Taxol/carboplatin 01/08/2021 Cycle 2 weekly Taxol/carboplatin  01/15/2021 Treatment held 01/22/2021 due to neutropenia Positive COVID test 02/02/2021 Taxol/carboplatin 02/15/2021 Endoscopy 04/30/2021-shallow ulceration at the distal esophagus, nodularity at the gastric cardia-biopsies negative for malignancy CTs 04/02/2022-small mediastinal and right hilar lymph nodes.  Esophagus is mildly patulous decreased from previous in the mid to upper aspect of the mediastinum.  More distally the wall thickening of the esophagus appears slightly decreased.  Small hiatal hernia.  The area of thickening does correspond to the abnormality on PET-CT.  Emphysema. Endoscopy 04/15/2022-Short segment salmon-colored mucosa 1 cm above the Z-line.  Scarring at the gastroesophageal junction/Z-line with nonobstructing narrowing.  Nodular mucosa in the gastric cardia immediately distal to the Z-line.  Pathology on GE junction nodule-invasive moderately differentiated adenocarcinoma, tumor arises within a background of high-grade dysplasia, mild chronic active gastritis with foveolar hyperplasia, negative for squamous epithelium and intestinal metaplasia.  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 Solid dysphagia secondary #1, resolved COPD on chest CT 12/14/2020 Hemorrhoids Hyperlipidemia Irritable bowel syndrome Hypothyroidism Tobacco use  Disposition: 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  No evidence of distant metastatic disease on recent CT scans.  In the past she has declined esophagectomy and confirms this decision today.  She wonders if endoscopic resection would be an option in her case.  We will pose this question to Dr. Hilarie Fredrickson.  We will also contact Dr. Sondra Come regarding the possibility of additional radiation.  We are referring her for a restaging PET scan.  Plan to obtain testing for PD-1 CPS and HER2 on the recent biopsy.  We had preliminary discussion regarding systemic therapy.  We reviewed the FOLFOX regimen and Taxol/ramucirumab.   She understands we will recommend Port-A-Cath placement for chemotherapy administration.  Her case will be  presented at an upcoming GI tumor conference.  She will return for follow-up in approximately 2 weeks.  Patient seen with Dr. Benay Spice.  CT images reviewed on the computer with Ms. Chino and her daughter.  Ned Card ANP/GNP-BC   04/23/2022  1:52 PM  This was a shared visit with Ned Card.  Alison Garner was interviewed and examined.  She has been diagnosed with local recurrence of gastroesophageal cancer.  There is no clinical evidence of distant metastatic disease.  She does not wish to consider an esophagogastrectomy.  We discussed other treatment options including repeat chemotherapy/radiation, chemotherapy, and biologic/targeted therapies.  She does not appear to be a candidate for a PD-1 inhibitor or HER2 directed therapy based on the testing from the November 2022 endoscopy.  She will be referred for a staging PET scan and return for further discussion.  We will repeat PD-1 and HER2 testing on the current biopsy.  I was present for greater than 50% of today's visit.  I performed medical decision making.  Julieanne Manson, MD

## 2022-04-25 ENCOUNTER — Encounter (HOSPITAL_COMMUNITY)
Admission: RE | Admit: 2022-04-25 | Discharge: 2022-04-25 | Disposition: A | Discharge status: Home / Self Care | Payer: Medicare Other | Source: Ambulatory Visit | Attending: Nurse Practitioner | Admitting: Nurse Practitioner

## 2022-04-25 DIAGNOSIS — C155 Malignant neoplasm of lower third of esophagus: Secondary | ICD-10-CM

## 2022-04-25 DIAGNOSIS — C160 Malignant neoplasm of cardia: Secondary | ICD-10-CM

## 2022-04-25 MED ORDER — FLUDEOXYGLUCOSE F - 18 (FDG) INJECTION
7.7500 | Freq: Once | INTRAVENOUS | Status: AC | PRN
  Administered 2022-04-25: 7.75 via INTRAVENOUS

## 2022-04-26 ENCOUNTER — Other Ambulatory Visit: Payer: Self-pay

## 2022-04-26 ENCOUNTER — Telehealth: Payer: Self-pay

## 2022-04-26 DIAGNOSIS — Q788 Other specified osteochondrodysplasias: Secondary | ICD-10-CM

## 2022-04-26 DIAGNOSIS — Z8501 Personal history of malignant neoplasm of esophagus: Secondary | ICD-10-CM

## 2022-04-26 NOTE — Telephone Encounter (Signed)
EGD scheduled as requested, pt aware. Amb ref in epic and prep instructions sent to pt via mychart.

## 2022-04-26 NOTE — Telephone Encounter (Signed)
----- Message from Irving Copas., MD sent at 04/25/2022  4:28 PM EDT ----- JMP, Sounds great. 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 Will find out if the PET/CT shows anything that would change this plan of action. Thanks. GM ----- Message ----- From: Jerene Bears, MD Sent: 04/25/2022   9:45 AM EDT To: Irving Copas., MD  Thanks Gabe! I like the plan JMP  ----- Message ----- From: Irving Copas., MD Sent: 04/24/2022   5:34 PM EDT To: Algernon Huxley, RN; Timothy Lasso, RN; #  JMP and BS, I called and spoke with the patient this afternoon. I think a repeat upper endoscopy makes sense. The reason for the repeat upper endoscopy is also to evaluate the area above the diagnosed cancer that was already showing evidence of potential intramucosal cancer not just high-grade dysplasia.  If this is longer segment rather than just the nodularity that you have described just below the Z-line, then certainly if an endoscopic attempt were to be considered ESD would be the more likely to be helpful.  Without an EUS we also do not have that absolute clarity but I do not have availability for EUS for a while so I think this repeat EGD first will give [place] an opportunity to look at things visually and for me to get more significant biopsies of the upper area of the esophagus so that I can really get a sense of whether I can offer EUS and attempted multiband EMR versus if it would be better for the patient to have EUS at one of the centers where they can then perform the ESD if needed. Now, if the PET/CT that is being done tomorrow shows evidence of distant disease, then this will be a moot point. The patient has agreed to this plan of action.  Patty or Vaughan Basta, Please schedule this patient for EGD with me next Tuesday (3/26) in my slot that is open. Repeat EGD to be done to further clarify diagnosis of cancer versus  high-grade dysplasia solely. Patient aware that if PET/CT is positive that we may not need to even do this procedure. Thanks. GM  ----- Message ----- From: Ladell Pier, MD Sent: 04/23/2022   7:59 PM EDT To: Jerene Bears, MD; Irving Copas., MD  Thank you guys, we will proceed with the PET and put her on for conference ----- Message ----- From: Jerene Bears, MD Sent: 04/23/2022   6:40 PM EDT To: Ladell Pier, MD; #  Damn, I am sorry.  I forgot the images did not capture. The best I can describe it would be nodular just below the Z-line and a small hiatal hernia.  Really is on the cardia side.  Again it is nodular but not really extending into the tubular esophagus. I could bring her back for EGD just to get detailed pictures if you would like or if this would help. She would not mind. JMP ----- Message ----- From: Irving Copas., MD Sent: 04/23/2022   5:03 PM EDT To: Jerene Bears, MD; Ladell Pier, MD  JMP, Hard to know without the pictures, but if this is within cardia, below the Z-line without a great amount of Hiatal Hernia present, EMR cap-band may be difficult.  Tunneling from above to get underneath, if possible could be considered but with the radiation she has had, this may not be possible either.  Maybe you can drop a picture or image of how you would describe the anatomy and I can make a final decision. GM  ----- Message ----- From: Jerene Bears, MD Sent: 04/23/2022   4:56 PM EDT To: Ladell Pier, MD; #  That is a good question I have copied Dr. Rush Landmark for his opinion on EMR versus ESD. If Dr. Rush Landmark feels that this might be possible, but if it needs tertiary care, we could see if Dr. Leroy Sea at [facility] could see her.  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  She had responded initially very well to chemotherapy and radiation. She wishes to  avoid esophagectomy if possible I just did an EGD and Julieanne Manson has recommended and scheduled repeat PET.  Thanks for your opinion Ulice Dash  ----- Message ----- From: Ladell Pier, MD Sent: 04/23/2022   4:27 PM EDT To: Jerene Bears, MD  We saw her today and discussed treatment options.  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  She does not wish to consider an esophagogastrectomy. We are referring her for a staging PET scan and repeating PD-1 and HER2 testing on the current biopsy.  We will ask Dr. Sondra Come whether she can receive additional radiation.  If she is not a radiation candidate the plan will be to proceed with salvage systemic therapy.  She is asking about endoscopic resection of the tumor.  I told her this was unlikely, but she wanted me to ask you.  No EUS has been performed   Thanks,  [employer]

## 2022-04-29 NOTE — Telephone Encounter (Signed)
Got it. Thanks. GM 

## 2022-04-29 NOTE — Telephone Encounter (Signed)
Appt cancelled per pt request. 

## 2022-04-29 NOTE — Telephone Encounter (Signed)
Inbound call from patient cancelling procedure for 3/26.

## 2022-04-30 ENCOUNTER — Encounter: Payer: Medicare Other | Admitting: Gastroenterology

## 2022-05-01 ENCOUNTER — Other Ambulatory Visit: Payer: Self-pay

## 2022-05-01 NOTE — Progress Notes (Signed)
The proposed treatment discussed in conference is for discussion purpose only and is not a binding recommendation.  The patients have not been physically examined, or presented with their treatment options.  Therefore, final treatment plans cannot be decided.  

## 2022-05-09 ENCOUNTER — Inpatient Hospital Stay: Payer: Medicare Other | Attending: Oncology

## 2022-05-09 ENCOUNTER — Inpatient Hospital Stay (HOSPITAL_BASED_OUTPATIENT_CLINIC_OR_DEPARTMENT_OTHER): Payer: Medicare Other | Admitting: Nurse Practitioner

## 2022-05-09 ENCOUNTER — Encounter: Payer: Self-pay | Admitting: Nurse Practitioner

## 2022-05-09 ENCOUNTER — Encounter: Payer: Self-pay | Admitting: Oncology

## 2022-05-09 VITALS — BP 122/61 | HR 97 | Temp 97.9°F | Resp 18 | Ht 65.0 in | Wt 142.0 lb

## 2022-05-09 DIAGNOSIS — E876 Hypokalemia: Secondary | ICD-10-CM | POA: Diagnosis not present

## 2022-05-09 DIAGNOSIS — K649 Unspecified hemorrhoids: Secondary | ICD-10-CM | POA: Insufficient documentation

## 2022-05-09 DIAGNOSIS — C155 Malignant neoplasm of lower third of esophagus: Secondary | ICD-10-CM | POA: Diagnosis present

## 2022-05-09 DIAGNOSIS — E039 Hypothyroidism, unspecified: Secondary | ICD-10-CM | POA: Diagnosis not present

## 2022-05-09 DIAGNOSIS — K589 Irritable bowel syndrome without diarrhea: Secondary | ICD-10-CM | POA: Insufficient documentation

## 2022-05-09 DIAGNOSIS — E785 Hyperlipidemia, unspecified: Secondary | ICD-10-CM | POA: Insufficient documentation

## 2022-05-09 DIAGNOSIS — J449 Chronic obstructive pulmonary disease, unspecified: Secondary | ICD-10-CM | POA: Insufficient documentation

## 2022-05-09 DIAGNOSIS — C160 Malignant neoplasm of cardia: Secondary | ICD-10-CM

## 2022-05-09 LAB — CMP (CANCER CENTER ONLY)
ALT: 15 U/L (ref 0–44)
AST: 18 U/L (ref 15–41)
Albumin: 4.4 g/dL (ref 3.5–5.0)
Alkaline Phosphatase: 68 U/L (ref 38–126)
Anion gap: 8 (ref 5–15)
BUN: 7 mg/dL — ABNORMAL LOW (ref 8–23)
CO2: 27 mmol/L (ref 22–32)
Calcium: 10.3 mg/dL (ref 8.9–10.3)
Chloride: 100 mmol/L (ref 98–111)
Creatinine: 0.95 mg/dL (ref 0.44–1.00)
GFR, Estimated: >60 mL/min (ref >60)
Glucose, Bld: 96 mg/dL (ref 70–99)
Potassium: 4.4 mmol/L (ref 3.5–5.1)
Sodium: 135 mmol/L (ref 135–145)
Total Bilirubin: 0.5 mg/dL (ref 0.3–1.2)
Total Protein: 7 g/dL (ref 6.5–8.1)

## 2022-05-09 LAB — CBC WITH DIFFERENTIAL (CANCER CENTER ONLY)
Abs Immature Granulocytes: 0.01 10*3/uL (ref 0.00–0.07)
Basophils Absolute: 0 10*3/uL (ref 0.0–0.1)
Basophils Relative: 0 %
Eosinophils Absolute: 0 10*3/uL (ref 0.0–0.5)
Eosinophils Relative: 1 %
HCT: 43.3 % (ref 36.0–46.0)
Hemoglobin: 15.1 g/dL — ABNORMAL HIGH (ref 12.0–15.0)
Immature Granulocytes: 0 %
Lymphocytes Relative: 32 %
Lymphs Abs: 2.1 10*3/uL (ref 0.7–4.0)
MCH: 32.9 pg (ref 26.0–34.0)
MCHC: 34.9 g/dL (ref 30.0–36.0)
MCV: 94.3 fL (ref 80.0–100.0)
Monocytes Absolute: 0.6 10*3/uL (ref 0.1–1.0)
Monocytes Relative: 8 %
Neutro Abs: 3.9 10*3/uL (ref 1.7–7.7)
Neutrophils Relative %: 59 %
Platelet Count: 193 10*3/uL (ref 150–400)
RBC: 4.59 MIL/uL (ref 3.87–5.11)
RDW: 13.5 % (ref 11.5–15.5)
WBC Count: 6.6 10*3/uL (ref 4.0–10.5)
nRBC: 0 % (ref 0.0–0.2)

## 2022-05-09 LAB — CEA (ACCESS): CEA (CHCC): 17.73 ng/mL — ABNORMAL HIGH (ref 0.00–5.00)

## 2022-05-09 NOTE — Progress Notes (Signed)
DISCONTINUE ON PATHWAY REGIMEN - Gastroesophageal     Administer weekly during RT:     Paclitaxel      Carboplatin   **Always confirm dose/schedule in your pharmacy ordering system**  REASON: Disease Progression PRIOR TREATMENT: [id number]: Carboplatin + Paclitaxel (2/50) Weekly (x 5-6 Weeks) with Concurrent RT TREATMENT RESPONSE: Complete Response (CR)  START ON PATHWAY REGIMEN - Gastroesophageal     A cycle is every 14 days:     Oxaliplatin      Leucovorin      Fluorouracil      Fluorouracil   **Always confirm dose/schedule in your pharmacy ordering system**  Patient Characteristics: Distant Metastases (cM1/pM1) / Locally Recurrent Disease, Adenocarcinoma - Esophageal, GE Junction, and Gastric, First Line, HER2 Negative/Unknown, PD?L1 Expression CPS < 5/Negative/Unknown, MSS/pMMR or MSI Unknown Therapeutic Status: Local Recurrence (No Additional Staging) Histology: Adenocarcinoma Disease Classification: Esophageal Line of Therapy: First Line HER2 Status: Negative PD-L1 Expression Status: PD-L1 Expression CPS < 5 Microsatellite/Mismatch Repair Status: Unknown Intent of Therapy: Non-Curative / Palliative Intent, Discussed with Patient

## 2022-05-09 NOTE — Progress Notes (Signed)
[facility] OFFICE PROGRESS NOTE   Diagnosis: Esophagus cancer  INTERVAL HISTORY:   Alison Garner returns for follow-up.  She has decided against evaluation to see if she is a candidate for endoscopic resection.  She would like to proceed with systemic therapy.  She feels well.  No dysphagia.  No nausea or vomiting.  Bowels are moving.  She has a good appetite.  Objective:  Vital signs in last 24 hours:  Blood pressure 122/61, pulse 97, temperature 97.9 F (36.6 C), temperature source Oral, resp. rate 18, height 5\' 5"  (1.651 m), weight 142 lb (64.4 kg), SpO2 98 %.    No physical exam  Lab Results:  Lab Results  Component Value Date   WBC 6.6 05/09/2022   HGB 15.1 (H) 05/09/2022   HCT 43.3 05/09/2022   MCV 94.3 05/09/2022   PLT 193 05/09/2022   NEUTROABS 3.9 05/09/2022    Imaging:  No results found.  Medications: I have reviewed the patient's current medications.  Assessment/Plan: Distal esophagus cancer Esophagram 11/21/2020-moderate to severe esophageal dysmotility, narrowing of the distal esophagus just above the GE junction to 4 mm, 13 mm barium tablet stuck in the distal esophagus Upper endoscopy 12/13/2020-severe esophagitis with nodularity and stricturing at 36 cm-adenocarcinoma, HER2 negative, no loss of mismatch repair protein expression, PD-L1 combined positive score less than 1% CT chest 12/14/2020-accentuated density at the distal esophageal lumen above a small type I hiatal hernia concerning for malignancy, nonpathologically enlarged AP window and right hilar nodes PET 12/21/2020-abnormal FDG uptake at the distal esophagus, no evidence of nodal or distant metastatic disease Radiation 01/08/2021-02/16/2021 Cycle 1 weekly Taxol/carboplatin 01/08/2021 Cycle 2 weekly Taxol/carboplatin 01/15/2021 Treatment held 01/22/2021 due to neutropenia Positive COVID test 02/02/2021 Taxol/carboplatin 02/15/2021 Endoscopy 04/30/2021-shallow ulceration at the distal  esophagus, nodularity at the gastric cardia-biopsies negative for malignancy CTs 04/02/2022-small mediastinal and right hilar lymph nodes.  Esophagus is mildly patulous decreased from previous in the mid to upper aspect of the mediastinum.  More distally the wall thickening of the esophagus appears slightly decreased.  Small hiatal hernia.  The area of thickening does correspond to the abnormality on PET-CT.  Emphysema. Endoscopy 04/15/2022-Short segment salmon-colored mucosa 1 cm above the Z-line.  Scarring at the gastroesophageal junction/Z-line with nonobstructing narrowing.  Nodular mucosa in the gastric cardia immediately distal to the Z-line.  Pathology on GE junction nodule-invasive moderately differentiated adenocarcinoma, tumor arises within a background of high-grade dysplasia, mild chronic active gastritis with foveolar hyperplasia, negative for squamous epithelium and intestinal metaplasia.  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  Negative for HER2 by IHC, PD-L1 CPS 2% PET scan 04/25/2022-increased radiotracer uptake associated with the distal esophagus at the level of the GE junction.  No evidence of nodal metastasis or distant metastatic disease. Solid dysphagia secondary #1, resolved COPD on chest CT 12/14/2020 Hemorrhoids Hyperlipidemia Irritable bowel syndrome Hypothyroidism Tobacco use    Disposition: Ms. Wean would like to proceed with systemic therapy.  We reviewed the FOLFOX regimen and Taxol/ramucirumab.  We reviewed potential side effects associated with both regimens.  She was provided with printed information as well.  She understands we recommend Port-A-Cath placement.  After further discussion the plan is to proceed with FOLFOX.  Referral made to interventional radiology for placement of a Port-A-Cath.  She would like to wait to start the chemotherapy until the end of this month.  She will return for lab, follow-up, cycle 1 FOLFOX on 06/04/2022.   We are available to see her sooner  if needed.  Patient seen with Dr. Benay Spice.  Ned Card ANP/GNP-BC   05/09/2022  1:16 PM This was a shared visit with Ned Card.  Ms. Felipe case was presented at the GI tumor conference within the past few weeks.  The gastroenterologists feel it may be possible to perform endoscopic resection of the tumor.  Ms. Eggert does not wish to consider endoscopic surgery.  She confirmed her decision against an esophagectomy.  We discussed systemic treatment options.  The PD-1 level is again low on the repeat biopsy.  I recommend FOLFOX chemotherapy.  We reviewed potential toxicities associated with the FOLFOX regimen she agrees to proceed.  A chemotherapy plan was entered today.  I was present for greater than 50% of today's visit.  I performed medical decision making.  Julieanne Manson, MD

## 2022-05-10 ENCOUNTER — Telehealth: Payer: Self-pay

## 2022-05-10 ENCOUNTER — Encounter: Payer: Self-pay | Admitting: Nurse Practitioner

## 2022-05-10 ENCOUNTER — Other Ambulatory Visit: Payer: Self-pay

## 2022-05-10 NOTE — Telephone Encounter (Signed)
The patient has been scheduled for a portacath placement procedure. She is aware that she will require transportation to and from the appointment, as well as supervision and support for the following 24 hours.

## 2022-05-13 ENCOUNTER — Other Ambulatory Visit: Payer: Self-pay | Admitting: Internal Medicine

## 2022-05-15 ENCOUNTER — Other Ambulatory Visit: Payer: Self-pay | Admitting: Radiology

## 2022-05-16 ENCOUNTER — Other Ambulatory Visit: Payer: Self-pay | Admitting: Student

## 2022-05-17 ENCOUNTER — Ambulatory Visit (HOSPITAL_COMMUNITY)
Admission: RE | Admit: 2022-05-17 | Discharge: 2022-05-17 | Disposition: A | Discharge status: Home / Self Care | Payer: Medicare Other | Source: Ambulatory Visit | Attending: Nurse Practitioner | Admitting: Nurse Practitioner

## 2022-05-17 ENCOUNTER — Other Ambulatory Visit: Payer: Self-pay

## 2022-05-17 DIAGNOSIS — I10 Essential (primary) hypertension: Secondary | ICD-10-CM | POA: Diagnosis not present

## 2022-05-17 DIAGNOSIS — C155 Malignant neoplasm of lower third of esophagus: Secondary | ICD-10-CM | POA: Diagnosis not present

## 2022-05-17 DIAGNOSIS — F17210 Nicotine dependence, cigarettes, uncomplicated: Secondary | ICD-10-CM | POA: Insufficient documentation

## 2022-05-17 DIAGNOSIS — E039 Hypothyroidism, unspecified: Secondary | ICD-10-CM | POA: Diagnosis not present

## 2022-05-17 DIAGNOSIS — K589 Irritable bowel syndrome without diarrhea: Secondary | ICD-10-CM | POA: Diagnosis not present

## 2022-05-17 HISTORY — PX: IR IMAGING GUIDED PORT INSERTION: IMG5740

## 2022-05-17 MED ORDER — HEPARIN SOD (PORK) LOCK FLUSH 100 UNIT/ML IV SOLN
INTRAVENOUS | Status: AC
  Filled 2022-05-17: qty 5

## 2022-05-17 MED ORDER — FENTANYL CITRATE (PF) 100 MCG/2ML IJ SOLN
INTRAMUSCULAR | Status: AC | PRN
  Administered 2022-05-17: 50 ug via INTRAVENOUS
  Administered 2022-05-17: 25 ug via INTRAVENOUS

## 2022-05-17 MED ORDER — HEPARIN SOD (PORK) LOCK FLUSH 100 UNIT/ML IV SOLN
500.0000 [IU] | Freq: Once | INTRAVENOUS | Status: AC
  Administered 2022-05-17: 500 [IU] via INTRAVENOUS

## 2022-05-17 MED ORDER — LIDOCAINE-EPINEPHRINE 1 %-1:100000 IJ SOLN
20.0000 mL | Freq: Once | INTRAMUSCULAR | Status: AC
  Administered 2022-05-17: 20 mL via INTRADERMAL

## 2022-05-17 MED ORDER — SODIUM CHLORIDE 0.9 % IV SOLN: INTRAVENOUS | Status: DC

## 2022-05-17 MED ORDER — FENTANYL CITRATE (PF) 100 MCG/2ML IJ SOLN
INTRAMUSCULAR | Status: AC
  Filled 2022-05-17: qty 2

## 2022-05-17 MED ORDER — MIDAZOLAM HCL 2 MG/2ML IJ SOLN
INTRAMUSCULAR | Status: AC | PRN
  Administered 2022-05-17: .5 mg via INTRAVENOUS
  Administered 2022-05-17: 1 mg via INTRAVENOUS

## 2022-05-17 MED ORDER — LIDOCAINE-EPINEPHRINE 1 %-1:100000 IJ SOLN
INTRAMUSCULAR | Status: AC
  Filled 2022-05-17: qty 1

## 2022-05-17 MED ORDER — MIDAZOLAM HCL 2 MG/2ML IJ SOLN
INTRAMUSCULAR | Status: AC
  Filled 2022-05-17: qty 2

## 2022-05-17 NOTE — Procedures (Signed)
Interventional Radiology Procedure:   Indications: Esophageal cancer  Procedure: Port placement  Findings: Right jugular port, tip at SVC/RA junction  Complications: None     EBL: Minimal, less than 10 ml  Plan: Discharge in one hour.  Keep port site and incisions dry for at least 24 hours.     Iylah Dworkin R. Dustyn Dansereau, MD  Pager: [phone]    

## 2022-05-17 NOTE — H&P (Signed)
Chief Complaint: Chemotherapy access. Team is requesting a portacath placement  Referring Physician(s): Thomas,Lisa K  Supervising Physician: Richarda Overlie  Patient Status: [facility] - Out-pt  History of Present Illness: Alison Garner is a 72 y.o. female  outpatient. History of  HTN, IBS, hiatal hernia, kidney stones, hypothyroidism and esophageal cancer. Team is requesting a portacath for chemotherapy access.   Currently without any significant complaints. Patient alert and laying in bed,calm. Denies any fevers, headache, chest pain, SOB, cough, abdominal pain, nausea, vomiting or bleeding. Return precautions and treatment recommendations and follow-up discussed with the patient  who is agreeable with the plan.   Past Medical History:  Diagnosis Date   Arthritis    Carotid bruit    Diverticulosis    Esophageal cancer    External hemorrhoids    Hiatal hernia    History of radiation therapy    Esophagus- 01/08/21-02/16/21- Dr. Antony Blackbird   Hyperlipidemia    Hypothyroidism    Internal hemorrhoids    Irritable bowel syndrome    Kidney infection    UTI hx   Kidney stones    Lichen planus    Macular cyst, hole, or pseudohole, unspecified eye    Thyroid disease    thyroid nodule   Vitamin D deficiency     Past Surgical History:  Procedure Laterality Date   COLONOSCOPY  2017   JMP-MAC-good prep-tics/hems/normal colon   DILATION AND CURETTAGE OF UTERUS  1982   UPPER GASTROINTESTINAL ENDOSCOPY  12/2020    Allergies: Bactrim [sulfamethoxazole-trimethoprim]  Medications: Prior to Admission medications   Medication Sig Start Date End Date Taking? Authorizing Provider  B COMPLEX-C PO Take 1 Dose by mouth daily at 6 (six) AM. B total drops- liquid vitamin b   Yes [provider]  Cholecalciferol (VITAMIN D3) 50 MCG (2000 UT) CAPS Take 2,000 Units by mouth 2 (two) times daily.   Yes [provider]  ezetimibe-simvastatin (VYTORIN) 10-20 MG tablet Take 1  tablet by mouth every other day.   Yes [provider]  imipramine (TOFRANIL) 25 MG tablet Take 25 mg by mouth daily. Takes 2 tablets in am   Yes [provider]  levothyroxine (SYNTHROID, LEVOTHROID) 25 MCG tablet at bedtime. 08/02/17  Yes [provider]  pantoprazole (PROTONIX) 40 MG tablet Take 1 tablet (40 mg total) by mouth daily. 04/15/22  Yes Pyrtle, Carie Caddy, MD     Family History  Problem Relation Age of Onset   Heart disease Mother    Hypertension Mother    Heart disease Father    Heart failure Father    Thyroid disease Brother    Heart disease Maternal Aunt    Other Maternal Aunt        MDS   Heart disease Maternal Uncle    Heart failure Maternal Uncle    Myasthenia gravis Paternal Aunt    Heart disease Paternal Uncle    Heart failure Paternal Uncle    Colon cancer Neg Hx    Esophageal cancer Neg Hx    Stomach cancer Neg Hx    Rectal cancer Neg Hx    Colon polyps Neg Hx     Social History   Socioeconomic History   Marital status: Single    Spouse name: Not on file   Number of children: Not on file   Years of education: Not on file   Highest education level: Not on file  Occupational History   Not on file  Tobacco Use  Smoking status: Every Day    Packs/day: 0.50    Years: 50.00    Additional pack years: 0.00    Total pack years: 25.00    Types: Cigarettes   Smokeless tobacco: Never  Vaping Use   Vaping Use: Never used  Substance and Sexual Activity   Alcohol use: Yes    Comment: rare   Drug use: No   Sexual activity: Not on file  Other Topics Concern   Not on file  Social History Narrative   Not on file   Social Determinants of Health   Financial Resource Strain: Not on file  Food Insecurity: Not on file  Transportation Needs: Not on file  Physical Activity: Not on file  Stress: Not on file  Social Connections: Not on file    Review of Systems: A 12 point ROS discussed and pertinent positives are indicated in the  HPI above.  All other systems are negative.  Review of Systems  Constitutional:  Negative for fatigue and fever.  HENT:  Negative for congestion.   Respiratory:  Negative for cough and shortness of breath.   Gastrointestinal:  Negative for abdominal pain, diarrhea, nausea and vomiting.    Vital Signs: BP (!) 141/58   Pulse 71   Temp 97.7 F (36.5 C) (Oral)   Resp 17   Ht 5' 5.5" (1.664 m)   Wt 142 lb (64.4 kg)   SpO2 93%   BMI 23.27 kg/m     Physical Exam Vitals and nursing note reviewed.  Constitutional:      Appearance: Normal appearance. She is well-developed.  HENT:     Head: Normocephalic and atraumatic.  Eyes:     Conjunctiva/sclera: Conjunctivae normal.  Cardiovascular:     Rate and Rhythm: Normal rate and regular rhythm.     Heart sounds: Normal heart sounds.  Pulmonary:     Effort: Pulmonary effort is normal.     Breath sounds: Normal breath sounds.  Musculoskeletal:        General: Normal range of motion.     Cervical back: Normal range of motion.  Skin:    General: Skin is warm and dry.  Neurological:     General: No focal deficit present.     Mental Status: She is alert and oriented to person, place, and time.  Psychiatric:        Mood and Affect: Mood normal.        Behavior: Behavior normal.        Thought Content: Thought content normal.        Judgment: Judgment normal.     Imaging: NM PET Image Restag (PS) Skull Base To Thigh  Result Date: 04/28/2022 CLINICAL DATA:  Subsequent treatment strategy for esophageal cancer. EXAM: NUCLEAR MEDICINE PET SKULL BASE TO THIGH TECHNIQUE: 7.75 mCi F-18 FDG was injected intravenously. Full-ring PET imaging was performed from the skull base to thigh after the radiotracer. CT data was obtained and used for attenuation correction and anatomic localization. Fasting blood glucose: 96 mg/dl COMPARISON:  CT chest, abdomen and pelvis 04/02/2022 and PET-CT 12/21/2020 FINDINGS: Mediastinal blood pool activity: SUV max  2.64 Liver activity: SUV max NA NECK: No hypermetabolic lymph nodes in the neck. Incidental CT findings: None. CHEST: Increased radiotracer uptake within the distal esophagus at the level of the GE junction has an SUV max of 4.84. No tracer avid supraclavicular, axillary, mediastinal, or hilar lymph nodes. No suspicious tracer avid pulmonary nodules identified. Incidental CT findings: Emphysema. Aortic atherosclerosis and  coronary artery calcifications. ABDOMEN/PELVIS: No abnormal hypermetabolic activity within the liver, pancreas, adrenal glands, or spleen. No hypermetabolic lymph nodes in the abdomen or pelvis. Incidental CT findings: Aortic atherosclerosis. SKELETON: No focal hypermetabolic activity to suggest skeletal metastasis. Incidental CT findings: None. IMPRESSION: 1. There is increased radiotracer uptake associated with the distal esophagus at the level of the GE junction compatible with locally recurrent esophageal cancer. 2. No signs of tracer avid nodal metastasis or distant metastatic disease. 3. Coronary artery calcifications. 4. Aortic Atherosclerosis (ICD10-I70.0) and Emphysema (ICD10-J43.9). Electronically Signed   By: Signa Kell M.D.   On: 04/28/2022 13:39    Labs:  CBC: Recent Labs    05/09/22 1254  WBC 6.6  HGB 15.1*  HCT 43.3  PLT 193    COAGS: No results for input(s): "INR", "APTT" in the last 8760 hours.  BMP: Recent Labs    04/02/22 0919 05/09/22 1254  NA 134* 135  K 4.3 4.4  CL 99 100  CO2 26 27  GLUCOSE 134* 96  BUN 7* 7*  CALCIUM 9.9 10.3  CREATININE 1.07* 0.95  GFRNONAA 55* >60    LIVER FUNCTION TESTS: Recent Labs    05/09/22 1254  BILITOT 0.5  AST 18  ALT 15  ALKPHOS 68  PROT 7.0  ALBUMIN 4.4    TUMOR MARKERS: Recent Labs    05/09/22 1254  CEA 17.73*    Assessment and Plan:  72 y.o. female outpatient. History of  HTN, IBS, hiatal hernia, kidney stones, hypothyroidism and esophageal cancer. Team is requesting a portacath for  chemotherapy access.   No line history per EPIC. PET scan from 3.24.24 shows RIJ is accessible. Labs from 4.4.24 are within acceptable parameters. Medications are within acceptable parameters. Allergies include bactrim. Patient has been NPO since midnight.   Risks and benefits of image guided port-a-catheter placement was discussed with the patient including, but not limited to bleeding, infection, pneumothorax, or fibrin sheath development and need for additional procedures.  All of the patient's questions were answered, patient is agreeable to proceed. Consent signed and in chart.   Thank you for this interesting consult.  I greatly enjoyed meeting Aryssa Rosamond and look forward to participating in their care.  A copy of this report was sent to the requesting provider on this date.  Electronically Signed: Alene Mires, NP 05/17/2022, 10:59 AM   I spent a total of  30 Minutes   in face to face in clinical consultation, greater than 50% of which was counseling/coordinating care for portacath placement

## 2022-05-23 ENCOUNTER — Other Ambulatory Visit: Payer: Self-pay | Admitting: *Deleted

## 2022-05-23 ENCOUNTER — Encounter: Payer: Self-pay | Admitting: *Deleted

## 2022-05-23 MED ORDER — ONDANSETRON HCL 8 MG PO TABS: 8.0000 mg | ORAL_TABLET | Freq: Three times a day (TID) | ORAL | 0 refills | Status: DC | PRN

## 2022-05-23 MED ORDER — PROCHLORPERAZINE MALEATE 10 MG PO TABS: 10.0000 mg | ORAL_TABLET | Freq: Four times a day (QID) | ORAL | 0 refills | Status: DC | PRN

## 2022-05-23 MED ORDER — LIDOCAINE-PRILOCAINE 2.5-2.5 % EX CREA: 1.0000 | TOPICAL_CREAM | CUTANEOUS | 0 refills | Status: DC | PRN

## 2022-05-23 NOTE — Progress Notes (Signed)
PATIENT NAVIGATOR PROGRESS NOTE  Name: Alison Garner Date: 05/23/2022 MRN: [id number]  DOB: 1950/12/25   Reason for visit:  Telephone call and discussed upcoming new chemo regimen  Comments:  Discussed FOLFOX chemo that she will begin on 4/30 She had Port placed on 05/17/22 so we discussed applying EMLA cream prior to chemo as well as confirmed that she is not taking vitamins with Folic Acid.  We discussed anti nausea meds and all were sent in to [facility] per her request  Encouraged her to call with any additional questions    Time spent counseling/coordinating care: 45-60 minutes

## 2022-06-02 ENCOUNTER — Other Ambulatory Visit: Payer: Self-pay | Admitting: Oncology

## 2022-06-02 DIAGNOSIS — C155 Malignant neoplasm of lower third of esophagus: Secondary | ICD-10-CM

## 2022-06-03 ENCOUNTER — Other Ambulatory Visit: Payer: Self-pay

## 2022-06-03 ENCOUNTER — Encounter: Payer: Self-pay | Admitting: Internal Medicine

## 2022-06-03 MED ORDER — PANTOPRAZOLE SODIUM 40 MG PO TBEC: 40.0000 mg | DELAYED_RELEASE_TABLET | Freq: Two times a day (BID) | ORAL | 3 refills | Status: DC

## 2022-06-03 MED ORDER — PANTOPRAZOLE SODIUM 40 MG PO TBEC: 40.0000 mg | DELAYED_RELEASE_TABLET | Freq: Two times a day (BID) | ORAL | 6 refills | Status: DC

## 2022-06-04 ENCOUNTER — Inpatient Hospital Stay: Payer: Medicare Other | Admitting: Licensed Clinical Social Worker

## 2022-06-04 ENCOUNTER — Encounter: Payer: Self-pay | Admitting: Oncology

## 2022-06-04 ENCOUNTER — Inpatient Hospital Stay (HOSPITAL_BASED_OUTPATIENT_CLINIC_OR_DEPARTMENT_OTHER): Payer: Medicare Other | Admitting: Nurse Practitioner

## 2022-06-04 ENCOUNTER — Encounter: Payer: Self-pay | Admitting: Nurse Practitioner

## 2022-06-04 ENCOUNTER — Inpatient Hospital Stay: Payer: Medicare Other

## 2022-06-04 ENCOUNTER — Other Ambulatory Visit (HOSPITAL_BASED_OUTPATIENT_CLINIC_OR_DEPARTMENT_OTHER): Payer: Self-pay

## 2022-06-04 VITALS — BP 107/58 | HR 85 | Temp 98.1°F | Resp 18 | Ht 65.0 in | Wt 139.0 lb

## 2022-06-04 VITALS — BP 170/83 | HR 91 | Temp 98.2°F | Resp 18

## 2022-06-04 DIAGNOSIS — C155 Malignant neoplasm of lower third of esophagus: Secondary | ICD-10-CM

## 2022-06-04 LAB — CBC WITH DIFFERENTIAL (CANCER CENTER ONLY)
Abs Immature Granulocytes: 0.02 10*3/uL (ref 0.00–0.07)
Basophils Absolute: 0 10*3/uL (ref 0.0–0.1)
Basophils Relative: 0 %
Eosinophils Absolute: 0.1 10*3/uL (ref 0.0–0.5)
Eosinophils Relative: 1 %
HCT: 41.1 % (ref 36.0–46.0)
Hemoglobin: 14.3 g/dL (ref 12.0–15.0)
Immature Granulocytes: 0 %
Lymphocytes Relative: 19 %
Lymphs Abs: 1.2 10*3/uL (ref 0.7–4.0)
MCH: 32.8 pg (ref 26.0–34.0)
MCHC: 34.8 g/dL (ref 30.0–36.0)
MCV: 94.3 fL (ref 80.0–100.0)
Monocytes Absolute: 0.4 10*3/uL (ref 0.1–1.0)
Monocytes Relative: 7 %
Neutro Abs: 4.3 10*3/uL (ref 1.7–7.7)
Neutrophils Relative %: 73 %
Platelet Count: 171 10*3/uL (ref 150–400)
RBC: 4.36 MIL/uL (ref 3.87–5.11)
RDW: 13.2 % (ref 11.5–15.5)
WBC Count: 6 10*3/uL (ref 4.0–10.5)
nRBC: 0 % (ref 0.0–0.2)

## 2022-06-04 LAB — CMP (CANCER CENTER ONLY)
ALT: 11 U/L (ref 0–44)
AST: 15 U/L (ref 15–41)
Albumin: 4.1 g/dL (ref 3.5–5.0)
Alkaline Phosphatase: 68 U/L (ref 38–126)
Anion gap: 9 (ref 5–15)
BUN: 8 mg/dL (ref 8–23)
CO2: 27 mmol/L (ref 22–32)
Calcium: 9.3 mg/dL (ref 8.9–10.3)
Chloride: 98 mmol/L (ref 98–111)
Creatinine: 1.12 mg/dL — ABNORMAL HIGH (ref 0.44–1.00)
GFR, Estimated: 52 mL/min — ABNORMAL LOW (ref >60)
Glucose, Bld: 165 mg/dL — ABNORMAL HIGH (ref 70–99)
Potassium: 3.3 mmol/L — ABNORMAL LOW (ref 3.5–5.1)
Sodium: 134 mmol/L — ABNORMAL LOW (ref 135–145)
Total Bilirubin: 0.7 mg/dL (ref 0.3–1.2)
Total Protein: 6.4 g/dL — ABNORMAL LOW (ref 6.5–8.1)

## 2022-06-04 LAB — CEA (ACCESS): CEA (CHCC): 13.21 ng/mL — ABNORMAL HIGH (ref 0.00–5.00)

## 2022-06-04 MED ORDER — SODIUM CHLORIDE 0.9 % IV SOLN
2400.0000 mg/m2 | INTRAVENOUS | Status: DC
  Administered 2022-06-04: 4150 mg via INTRAVENOUS
  Filled 2022-06-04: qty 83

## 2022-06-04 MED ORDER — SODIUM CHLORIDE 0.9 % IV SOLN
10.0000 mg | Freq: Once | INTRAVENOUS | Status: AC
  Administered 2022-06-04: 10 mg via INTRAVENOUS
  Filled 2022-06-04: qty 10

## 2022-06-04 MED ORDER — FLUOROURACIL CHEMO INJECTION 2.5 GM/50ML
400.0000 mg/m2 | Freq: Once | INTRAVENOUS | Status: AC
  Administered 2022-06-04: 700 mg via INTRAVENOUS
  Filled 2022-06-04: qty 14

## 2022-06-04 MED ORDER — LEUCOVORIN CALCIUM INJECTION 350 MG
400.0000 mg/m2 | Freq: Once | INTRAVENOUS | Status: AC
  Administered 2022-06-04: 688 mg via INTRAVENOUS
  Filled 2022-06-04: qty 34.4

## 2022-06-04 MED ORDER — OXALIPLATIN CHEMO INJECTION 100 MG/20ML
85.0000 mg/m2 | Freq: Once | INTRAVENOUS | Status: AC
  Administered 2022-06-04: 150 mg via INTRAVENOUS
  Filled 2022-06-04: qty 20

## 2022-06-04 MED ORDER — DEXTROSE 5 % IV SOLN: Freq: Once | INTRAVENOUS | Status: AC

## 2022-06-04 MED ORDER — PALONOSETRON HCL INJECTION 0.25 MG/5ML
0.2500 mg | Freq: Once | INTRAVENOUS | Status: AC
  Administered 2022-06-04: 0.25 mg via INTRAVENOUS
  Filled 2022-06-04: qty 5

## 2022-06-04 MED ORDER — POTASSIUM CHLORIDE CRYS ER 20 MEQ PO TBCR
20.0000 meq | EXTENDED_RELEASE_TABLET | Freq: Every day | ORAL | 1 refills | Status: DC
Start: 2022-06-04 — End: 2023-11-24
  Filled 2022-06-04: qty 30, 30d supply, fill #0
  Filled 2022-07-02: qty 30, 30d supply, fill #1

## 2022-06-04 NOTE — Progress Notes (Signed)
[facility] Clinical Social Work  Clinical Social Work was referred by new patient protocol for assessment of psychosocial needs.  Clinical Social Worker met with patient and her daughter, Aundra Millet, to offer support and assess for needs.    Patient was finishing up her treatment today and expressed no concerns.  CSW provided contact information, Gaffer, Network engineer, and [employer] and [facility]ness information.  CSW to follow up by phone.     Dorothey Baseman, LCSW  Clinical Social Worker [facility]

## 2022-06-04 NOTE — Progress Notes (Signed)
Pt returned to infusion minutes after being discharged complaining of tightness in her throat and congested sound in her throat. Patient instructed to cup her hands over her mouth and was given a warm towel. Patients symptoms improved. Roxan Diesel, NP and Dr Truett Perna notified.  Per Misty Stanley, NP and Dr Truett Perna continue with the hands over mouth and breath in warm air.  Vital signs obtained. Patient observed for 15 minutes. Patient stable upon leaving infusion room.

## 2022-06-04 NOTE — Progress Notes (Signed)
[facility] OFFICE PROGRESS NOTE   Diagnosis: Esophagus cancer  INTERVAL HISTORY:   [name] returns as scheduled.  She is seen today prior to beginning cycle 1 FOLFOX.  She feels well.  No dysphagia.  No nausea.  She has a good appetite.  No pre-existing neuropathy symptoms.  Objective:  Vital signs in last 24 hours:  Blood pressure (!) 107/58, pulse 85, temperature 98.1 F (36.7 C), temperature source Oral, resp. rate 18, height 5\' 5"  (1.651 m), weight 139 lb (63 kg), SpO2 100 %.    HEENT: No thrush or ulcers. Resp: Lungs clear bilaterally. Cardio: Regular rate and rhythm. GI: No hepatosplenomegaly. Vascular: No leg edema. Port-A-Cath without erythema.  Lab Results:  Lab Results  Component Value Date   WBC 6.0 06/04/2022   HGB 14.3 06/04/2022   HCT 41.1 06/04/2022   MCV 94.3 06/04/2022   PLT 171 06/04/2022   NEUTROABS 4.3 06/04/2022    Imaging:  No results found.  Medications: I have reviewed the patient's current medications.  Assessment/Plan: Distal esophagus cancer Esophagram 11/21/2020-moderate to severe esophageal dysmotility, narrowing of the distal esophagus just above the GE junction to 4 mm, 13 mm barium tablet stuck in the distal esophagus Upper endoscopy 12/13/2020-severe esophagitis with nodularity and stricturing at 36 cm-adenocarcinoma, HER2 negative, no loss of mismatch repair protein expression, PD-L1 combined positive score less than 1% CT chest 12/14/2020-accentuated density at the distal esophageal lumen above a small type I hiatal hernia concerning for malignancy, nonpathologically enlarged AP window and right hilar nodes PET 12/21/2020-abnormal FDG uptake at the distal esophagus, no evidence of nodal or distant metastatic disease Radiation 01/08/2021-02/16/2021 Cycle 1 weekly Taxol/carboplatin 01/08/2021 Cycle 2 weekly Taxol/carboplatin 01/15/2021 Treatment held 01/22/2021 due to neutropenia Positive COVID test  02/02/2021 Taxol/carboplatin 02/15/2021 Endoscopy 04/30/2021-shallow ulceration at the distal esophagus, nodularity at the gastric cardia-biopsies negative for malignancy CTs 04/02/2022-small mediastinal and right hilar lymph nodes.  Esophagus is mildly patulous decreased from previous in the mid to upper aspect of the mediastinum.  More distally the wall thickening of the esophagus appears slightly decreased.  Small hiatal hernia.  The area of thickening does correspond to the abnormality on PET-CT.  Emphysema. Endoscopy 04/15/2022-Short segment salmon-colored mucosa 1 cm above the Z-line.  Scarring at the gastroesophageal junction/Z-line with nonobstructing narrowing.  Nodular mucosa in the gastric cardia immediately distal to the Z-line.  Pathology on GE junction nodule-invasive moderately differentiated adenocarcinoma, tumor arises within a background of high-grade dysplasia, mild chronic active gastritis with foveolar hyperplasia, negative for squamous epithelium and intestinal metaplasia.  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  Negative for HER2 by IHC, PD-L1 CPS 2% PET scan 04/25/2022-increased radiotracer uptake associated with the distal esophagus at the level of the GE junction.  No evidence of nodal metastasis or distant metastatic disease. Cycle 1 FOLFOX 06/04/2022 Solid dysphagia secondary #1, resolved COPD on chest CT 12/14/2020 Hemorrhoids Hyperlipidemia Irritable bowel syndrome Hypothyroidism Tobacco use  Disposition: [name] appears stable.  She is scheduled to begin treatment today with FOLFOX.  We again reviewed potential toxicities.  She agrees to proceed.  CBC and chemistry panel reviewed.  Labs adequate to proceed with FOLFOX today as scheduled.  She has mild hypokalemia.  She will begin an oral potassium supplement.  She will return for follow-up and cycle 2 FOLFOX in 2 weeks.  She will contact the office in the interim with any  problems.    [name] ANP/GNP-BC   06/04/2022  10:02 AM

## 2022-06-04 NOTE — Progress Notes (Signed)
Patient seen by [name] NP today  Vitals are within treatment parameters.  Labs reviewed by [name] NP and are not all within treatment parameters. Potassium is 3.3. Patient will start oral K+.  Per physician team, patient is ready for treatment and there are NO modifications to the treatment plan.

## 2022-06-04 NOTE — Patient Instructions (Addendum)
[place] [facility]  The chemotherapy medication bag should finish at 46 hours, 96 hours, or 7 days. For example, if your pump is scheduled for 46 hours and it was put on at 4:00 p.m., it should finish at 2:00 p.m. the day it is scheduled to come off regardless of your appointment time.     Estimated time to finish at  12:30 Thursday, Jun 06, 2022.   If the display on your pump reads "Low Volume" and it is beeping, take the batteries out of the pump and come to the cancer center for it to be taken off.   If the pump alarms go off prior to the pump reading "Low Volume" then call [phone] and someone can assist you.  If the plunger comes out and the chemotherapy medication is leaking out, please use your home chemo spill kit to clean up the spill. Do NOT use paper towels or other household products.  If you have problems or questions regarding your pump, please call either [phone] (24 hours a day) or the cancer center Monday-Friday 8:00 a.m.- 4:30 p.m. at the clinic number and we will assist you. If you are unable to get assistance, then go to the nearest Emergency Department and ask the staff to contact the IV team for assistance.   Discharge Instructions: Thank you for choosing [facility] to provide your oncology and hematology care.   If you have a lab appointment with the Cancer Center, please go directly to the Cancer Center and check in at the registration area.   Wear comfortable clothing and clothing appropriate for easy access to any Portacath or PICC line.   We strive to give you quality time with your provider. You may need to reschedule your appointment if you arrive late (15 or more minutes).  Arriving late affects you and other patients whose appointments are after yours.  Also, if you miss three or more appointments without notifying the office, you may be dismissed from the clinic at the provider's discretion.      For  prescription refill requests, have your pharmacy contact our office and allow 72 hours for refills to be completed.    Today you received the following chemotherapy and/or immunotherapy agents Oxaliplatin, Leucovorin, Fluorouracil.      To help prevent nausea and vomiting after your treatment, we encourage you to take your nausea medication as directed.  BELOW ARE SYMPTOMS THAT SHOULD BE REPORTED IMMEDIATELY: *FEVER GREATER THAN 100.4 F (38 C) OR HIGHER *CHILLS OR SWEATING *NAUSEA AND VOMITING THAT IS NOT CONTROLLED WITH YOUR NAUSEA MEDICATION *UNUSUAL SHORTNESS OF BREATH *UNUSUAL BRUISING OR BLEEDING *URINARY PROBLEMS (pain or burning when urinating, or frequent urination) *BOWEL PROBLEMS (unusual diarrhea, constipation, pain near the anus) TENDERNESS IN MOUTH AND THROAT WITH OR WITHOUT PRESENCE OF ULCERS (sore throat, sores in mouth, or a toothache) UNUSUAL RASH, SWELLING OR PAIN  UNUSUAL VAGINAL DISCHARGE OR ITCHING   Items with * indicate a potential emergency and should be followed up as soon as possible or go to the Emergency Department if any problems should occur.  Please show the CHEMOTHERAPY ALERT CARD or IMMUNOTHERAPY ALERT CARD at check-in to the Emergency Department and triage nurse.  Should you have questions after your visit or need to cancel or reschedule your appointment, please contact [facility] [facility]  Dept: [phone]  and follow the prompts.  Office hours are 8:00 a.m. to 4:30 p.m. Monday - Friday. Please  note that voicemails left after 4:00 p.m. may not be returned until the following business day.  We are closed weekends and major holidays. You have access to a nurse at all times for urgent questions. Please call the main number to the clinic Dept: [phone] and follow the prompts.   For any non-urgent questions, you may also contact your provider using MyChart. We now offer e-Visits for anyone 53 and older to request care online  for non-urgent symptoms. For details visit [email or web address].   Also download the MyChart app! Go to the app store, search "MyChart", open the app, select [place] [facility], and log in with your MyChart username and password.  Oxaliplatin Injection What is this medication? OXALIPLATIN (ox AL i PLA tin) treats colorectal cancer. It works by slowing down the growth of cancer cells. This medicine may be used for other purposes; ask your health care provider or pharmacist if you have questions. COMMON BRAND NAME(S): Eloxatin What should I tell my care team before I take this medication? They need to know if you have any of these conditions: Heart disease History of irregular heartbeat or rhythm Liver disease Low blood cell levels (white cells, red cells, and platelets) Lung or breathing disease, such as asthma Take medications that treat or prevent blood clots Tingling of the fingers, toes, or other nerve disorder An unusual or allergic reaction to oxaliplatin, other medications, foods, dyes, or preservatives If you or your partner are pregnant or trying to get pregnant Breast-feeding How should I use this medication? This medication is injected into a vein. It is given by your care team in a hospital or clinic setting. Talk to your care team about the use of this medication in children. Special care may be needed. Overdosage: If you think you have taken too much of this medicine contact a poison control center or emergency room at once. NOTE: This medicine is only for you. Do not share this medicine with others. What if I miss a dose? Keep appointments for follow-up doses. It is important not to miss a dose. Call your care team if you are unable to keep an appointment. What may interact with this medication? Do not take this medication with any of the following: Cisapride Dronedarone Pimozide Thioridazine This medication may also interact with the following: Aspirin and aspirin-like  medications Certain medications that treat or prevent blood clots, such as warfarin, apixaban, dabigatran, and rivaroxaban Cisplatin Cyclosporine Diuretics Medications for infection, such as acyclovir, adefovir, amphotericin B, bacitracin, cidofovir, foscarnet, ganciclovir, gentamicin, pentamidine, vancomycin NSAIDs, medications for pain and inflammation, such as ibuprofen or naproxen Other medications that cause heart rhythm changes Pamidronate Zoledronic acid This list may not describe all possible interactions. Give your health care provider a list of all the medicines, herbs, non-prescription drugs, or dietary supplements you use. Also tell them if you smoke, drink alcohol, or use illegal drugs. Some items may interact with your medicine. What should I watch for while using this medication? Your condition will be monitored carefully while you are receiving this medication. You may need blood work while taking this medication. This medication may make you feel generally unwell. This is not uncommon as chemotherapy can affect healthy cells as well as cancer cells. Report any side effects. Continue your course of treatment even though you feel ill unless your care team tells you to stop. This medication may increase your risk of getting an infection. Call your care team for advice if you get a fever, chills, sore throat,  or other symptoms of a cold or flu. Do not treat yourself. Try to avoid being around people who are sick. Avoid taking medications that contain aspirin, acetaminophen, ibuprofen, naproxen, or ketoprofen unless instructed by your care team. These medications may hide a fever. Be careful brushing or flossing your teeth or using a toothpick because you may get an infection or bleed more easily. If you have any dental work done, tell your dentist you are receiving this medication. This medication can make you more sensitive to cold. Do not drink cold drinks or use ice. Cover exposed  skin before coming in contact with cold temperatures or cold objects. When out in cold weather wear warm clothing and cover your mouth and nose to warm the air that goes into your lungs. Tell your care team if you get sensitive to the cold. Talk to your care team if you or your partner are pregnant or think either of you might be pregnant. This medication can cause serious birth defects if taken during pregnancy and for 9 months after the last dose. A negative pregnancy test is required before starting this medication. A reliable form of contraception is recommended while taking this medication and for 9 months after the last dose. Talk to your care team about effective forms of contraception. Do not father a child while taking this medication and for 6 months after the last dose. Use a condom while having sex during this time period. Do not breastfeed while taking this medication and for 3 months after the last dose. This medication may cause infertility. Talk to your care team if you are concerned about your fertility. What side effects may I notice from receiving this medication? Side effects that you should report to your care team as soon as possible: Allergic reactions--skin rash, itching, hives, swelling of the face, lips, tongue, or throat Bleeding--bloody or black, tar-like stools, vomiting blood or brown material that looks like coffee grounds, red or dark brown urine, small red or purple spots on skin, unusual bruising or bleeding Dry cough, shortness of breath or trouble breathing Heart rhythm changes--fast or irregular heartbeat, dizziness, feeling faint or lightheaded, chest pain, trouble breathing Infection--fever, chills, cough, sore throat, wounds that don't heal, pain or trouble when passing urine, general feeling of discomfort or being unwell Liver injury--right upper belly pain, loss of appetite, nausea, light-colored stool, dark yellow or brown urine, yellowing skin or eyes, unusual  weakness or fatigue Low red blood cell level--unusual weakness or fatigue, dizziness, headache, trouble breathing Muscle injury--unusual weakness or fatigue, muscle pain, dark yellow or brown urine, decrease in amount of urine Pain, tingling, or numbness in the hands or feet Sudden and severe headache, confusion, change in vision, seizures, which may be signs of posterior reversible encephalopathy syndrome (PRES) Unusual bruising or bleeding Side effects that usually do not require medical attention (report to your care team if they continue or are bothersome): Diarrhea Nausea Pain, redness, or swelling with sores inside the mouth or throat Unusual weakness or fatigue Vomiting This list may not describe all possible side effects. Call your doctor for medical advice about side effects. You may report side effects to FDA at 1-800-FDA-1088. Where should I keep my medication? This medication is given in a hospital or clinic. It will not be stored at home. NOTE: This sheet is a summary. It may not cover all possible information. If you have questions about this medicine, talk to your doctor, pharmacist, or health care provider.  2023 Elsevier/Gold  Standard (2007-03-14 00:00:00)  Leucovorin Injection What is this medication? LEUCOVORIN (loo koe VOR in) prevents side effects from certain medications, such as methotrexate. It works by increasing folate levels. This helps protect healthy cells in your body. It may also be used to treat anemia caused by low levels of folate. It can also be used with fluorouracil, a type of chemotherapy, to treat colorectal cancer. It works by increasing the effects of fluorouracil in the body. This medicine may be used for other purposes; ask your health care provider or pharmacist if you have questions. What should I tell my care team before I take this medication? They need to know if you have any of these conditions: Anemia from low levels of vitamin B12 in the  blood An unusual or allergic reaction to leucovorin, folic acid, other medications, foods, dyes, or preservatives Pregnant or trying to get pregnant Breastfeeding How should I use this medication? This medication is injected into a vein or a muscle. It is given by your care team in a hospital or clinic setting. Talk to your care team about the use of this medication in children. Special care may be needed. Overdosage: If you think you have taken too much of this medicine contact a poison control center or emergency room at once. NOTE: This medicine is only for you. Do not share this medicine with others. What if I miss a dose? Keep appointments for follow-up doses. It is important not to miss your dose. Call your care team if you are unable to keep an appointment. What may interact with this medication? Capecitabine Fluorouracil Phenobarbital Phenytoin Primidone Trimethoprim;sulfamethoxazole This list may not describe all possible interactions. Give your health care provider a list of all the medicines, herbs, non-prescription drugs, or dietary supplements you use. Also tell them if you smoke, drink alcohol, or use illegal drugs. Some items may interact with your medicine. What should I watch for while using this medication? Your condition will be monitored carefully while you are receiving this medication. This medication may increase the side effects of 5-fluorouracil. Tell your care team if you have diarrhea or mouth sores that do not get better or that get worse. What side effects may I notice from receiving this medication? Side effects that you should report to your care team as soon as possible: Allergic reactions--skin rash, itching, hives, swelling of the face, lips, tongue, or throat This list may not describe all possible side effects. Call your doctor for medical advice about side effects. You may report side effects to FDA at 1-800-FDA-1088. Where should I keep my  medication? This medication is given in a hospital or clinic. It will not be stored at home. NOTE: This sheet is a summary. It may not cover all possible information. If you have questions about this medicine, talk to your doctor, pharmacist, or health care provider.  2023 Elsevier/Gold Standard (2021-06-01 00:00:00)  Fluorouracil Injection What is this medication? FLUOROURACIL (flure oh YOOR a sil) treats some types of cancer. It works by slowing down the growth of cancer cells. This medicine may be used for other purposes; ask your health care provider or pharmacist if you have questions. COMMON BRAND NAME(S): Adrucil What should I tell my care team before I take this medication? They need to know if you have any of these conditions: Blood disorders Dihydropyrimidine dehydrogenase (DPD) deficiency Infection, such as chickenpox, cold sores, herpes Kidney disease Liver disease Poor nutrition Recent or ongoing radiation therapy An unusual or allergic reaction  to fluorouracil, other medications, foods, dyes, or preservatives If you or your partner are pregnant or trying to get pregnant Breast-feeding How should I use this medication? This medication is injected into a vein. It is administered by your care team in a hospital or clinic setting. Talk to your care team about the use of this medication in children. Special care may be needed. Overdosage: If you think you have taken too much of this medicine contact a poison control center or emergency room at once. NOTE: This medicine is only for you. Do not share this medicine with others. What if I miss a dose? Keep appointments for follow-up doses. It is important not to miss your dose. Call your care team if you are unable to keep an appointment. What may interact with this medication? Do not take this medication with any of the following: Live virus vaccines This medication may also interact with the following: Medications that treat  or prevent blood clots, such as warfarin, enoxaparin, dalteparin This list may not describe all possible interactions. Give your health care provider a list of all the medicines, herbs, non-prescription drugs, or dietary supplements you use. Also tell them if you smoke, drink alcohol, or use illegal drugs. Some items may interact with your medicine. What should I watch for while using this medication? Your condition will be monitored carefully while you are receiving this medication. This medication may make you feel generally unwell. This is not uncommon as chemotherapy can affect healthy cells as well as cancer cells. Report any side effects. Continue your course of treatment even though you feel ill unless your care team tells you to stop. In some cases, you may be given additional medications to help with side effects. Follow all directions for their use. This medication may increase your risk of getting an infection. Call your care team for advice if you get a fever, chills, sore throat, or other symptoms of a cold or flu. Do not treat yourself. Try to avoid being around people who are sick. This medication may increase your risk to bruise or bleed. Call your care team if you notice any unusual bleeding. Be careful brushing or flossing your teeth or using a toothpick because you may get an infection or bleed more easily. If you have any dental work done, tell your dentist you are receiving this medication. Avoid taking medications that contain aspirin, acetaminophen, ibuprofen, naproxen, or ketoprofen unless instructed by your care team. These medications may hide a fever. Do not treat diarrhea with over the counter products. Contact your care team if you have diarrhea that lasts more than 2 days or if it is severe and watery. This medication can make you more sensitive to the sun. Keep out of the sun. If you cannot avoid being in the sun, wear protective clothing and sunscreen. Do not use sun lamps,  tanning beds, or tanning booths. Talk to your care team if you or your partner wish to become pregnant or think you might be pregnant. This medication can cause serious birth defects if taken during pregnancy and for 3 months after the last dose. A reliable form of contraception is recommended while taking this medication and for 3 months after the last dose. Talk to your care team about effective forms of contraception. Do not father a child while taking this medication and for 3 months after the last dose. Use a condom while having sex during this time period. Do not breastfeed while taking this medication. This  medication may cause infertility. Talk to your care team if you are concerned about your fertility. What side effects may I notice from receiving this medication? Side effects that you should report to your care team as soon as possible: Allergic reactions--skin rash, itching, hives, swelling of the face, lips, tongue, or throat Heart attack--pain or tightness in the chest, shoulders, arms, or jaw, nausea, shortness of breath, cold or clammy skin, feeling faint or lightheaded Heart failure--shortness of breath, swelling of the ankles, feet, or hands, sudden weight gain, unusual weakness or fatigue Heart rhythm changes--fast or irregular heartbeat, dizziness, feeling faint or lightheaded, chest pain, trouble breathing High ammonia level--unusual weakness or fatigue, confusion, loss of appetite, nausea, vomiting, seizures Infection--fever, chills, cough, sore throat, wounds that don't heal, pain or trouble when passing urine, general feeling of discomfort or being unwell Low red blood cell level--unusual weakness or fatigue, dizziness, headache, trouble breathing Pain, tingling, or numbness in the hands or feet, muscle weakness, change in vision, confusion or trouble speaking, loss of balance or coordination, trouble walking, seizures Redness, swelling, and blistering of the skin over hands and  feet Severe or prolonged diarrhea Unusual bruising or bleeding Side effects that usually do not require medical attention (report to your care team if they continue or are bothersome): Dry skin Headache Increased tears Nausea Pain, redness, or swelling with sores inside the mouth or throat Sensitivity to light Vomiting This list may not describe all possible side effects. Call your doctor for medical advice about side effects. You may report side effects to FDA at 1-800-FDA-1088. Where should I keep my medication? This medication is given in a hospital or clinic. It will not be stored at home. NOTE: This sheet is a summary. It may not cover all possible information. If you have questions about this medicine, talk to your doctor, pharmacist, or health care provider.  2023 Elsevier/Gold Standard (2021-05-22 00:00:00)

## 2022-06-05 ENCOUNTER — Telehealth: Payer: Self-pay | Admitting: Emergency Medicine

## 2022-06-05 NOTE — Telephone Encounter (Signed)
24 Hour Callback 24 Hour Callback post 1st time Folfox infusion. Pt reports no further incidents of feeling like her throat was closing due to cool air temperature.  Pt denies and N/V/D. Eating and drinking without difficulty.  Pt understands to call with any questions or concerns.

## 2022-06-06 ENCOUNTER — Inpatient Hospital Stay: Payer: Medicare Other | Attending: Oncology

## 2022-06-06 VITALS — BP 126/60 | HR 92 | Temp 98.0°F | Resp 18

## 2022-06-06 DIAGNOSIS — E039 Hypothyroidism, unspecified: Secondary | ICD-10-CM | POA: Insufficient documentation

## 2022-06-06 DIAGNOSIS — K589 Irritable bowel syndrome without diarrhea: Secondary | ICD-10-CM | POA: Diagnosis not present

## 2022-06-06 DIAGNOSIS — J449 Chronic obstructive pulmonary disease, unspecified: Secondary | ICD-10-CM | POA: Insufficient documentation

## 2022-06-06 DIAGNOSIS — Z5111 Encounter for antineoplastic chemotherapy: Secondary | ICD-10-CM | POA: Insufficient documentation

## 2022-06-06 DIAGNOSIS — Z5189 Encounter for other specified aftercare: Secondary | ICD-10-CM | POA: Diagnosis not present

## 2022-06-06 DIAGNOSIS — L271 Localized skin eruption due to drugs and medicaments taken internally: Secondary | ICD-10-CM | POA: Insufficient documentation

## 2022-06-06 DIAGNOSIS — E785 Hyperlipidemia, unspecified: Secondary | ICD-10-CM | POA: Diagnosis not present

## 2022-06-06 DIAGNOSIS — D6959 Other secondary thrombocytopenia: Secondary | ICD-10-CM | POA: Insufficient documentation

## 2022-06-06 DIAGNOSIS — K649 Unspecified hemorrhoids: Secondary | ICD-10-CM | POA: Diagnosis not present

## 2022-06-06 DIAGNOSIS — C155 Malignant neoplasm of lower third of esophagus: Secondary | ICD-10-CM | POA: Diagnosis present

## 2022-06-06 DIAGNOSIS — D709 Neutropenia, unspecified: Secondary | ICD-10-CM | POA: Insufficient documentation

## 2022-06-06 MED ORDER — HEPARIN SOD (PORK) LOCK FLUSH 100 UNIT/ML IV SOLN
500.0000 [IU] | Freq: Once | INTRAVENOUS | Status: AC | PRN
  Administered 2022-06-06: 500 [IU]

## 2022-06-06 MED ORDER — SODIUM CHLORIDE 0.9% FLUSH
10.0000 mL | INTRAVENOUS | Status: DC | PRN
  Administered 2022-06-06: 10 mL

## 2022-06-06 NOTE — Patient Instructions (Signed)

## 2022-06-10 ENCOUNTER — Other Ambulatory Visit: Payer: Self-pay | Admitting: Nurse Practitioner

## 2022-06-10 ENCOUNTER — Telehealth: Payer: Self-pay

## 2022-06-10 DIAGNOSIS — C155 Malignant neoplasm of lower third of esophagus: Secondary | ICD-10-CM

## 2022-06-10 NOTE — Telephone Encounter (Signed)
The patient reported experiencing discomfort in the root of her mouth and back of her throat, with a white coating on the back of her tongue. She requested a prescription for Diflucan to treat thrush. I recommended that the patient submit a photo through our my charting system for further evaluation.

## 2022-06-11 ENCOUNTER — Other Ambulatory Visit: Payer: Self-pay

## 2022-06-11 ENCOUNTER — Telehealth: Payer: Self-pay

## 2022-06-11 ENCOUNTER — Other Ambulatory Visit: Payer: Self-pay | Admitting: Oncology

## 2022-06-11 DIAGNOSIS — C155 Malignant neoplasm of lower third of esophagus: Secondary | ICD-10-CM

## 2022-06-11 MED ORDER — FLUCONAZOLE 100 MG PO TABS
100.0000 mg | ORAL_TABLET | Freq: Every day | ORAL | 0 refills | Status: DC
Start: 2022-06-11 — End: 2023-03-04

## 2022-06-11 NOTE — Telephone Encounter (Signed)
I contacted the patient to notify them that I have prescribed Diflucan 100mg  to be taken orally once daily for a period of 4 days. Additionally, I advised the patient to refrain from taking Vytorin for a period of 7 days as there may be a potential interaction with Diflucan. The patient verbally confirmed their understanding of the instructions and did not express any further questions or concerns during our conversation.

## 2022-06-18 ENCOUNTER — Encounter: Payer: Self-pay | Admitting: Nurse Practitioner

## 2022-06-18 ENCOUNTER — Inpatient Hospital Stay: Payer: Medicare Other

## 2022-06-18 ENCOUNTER — Inpatient Hospital Stay (HOSPITAL_BASED_OUTPATIENT_CLINIC_OR_DEPARTMENT_OTHER): Payer: Medicare Other | Admitting: Nurse Practitioner

## 2022-06-18 VITALS — BP 115/58 | HR 92 | Temp 98.1°F | Resp 18 | Ht 65.0 in | Wt 138.9 lb

## 2022-06-18 VITALS — BP 145/69 | HR 73

## 2022-06-18 DIAGNOSIS — C155 Malignant neoplasm of lower third of esophagus: Secondary | ICD-10-CM | POA: Diagnosis not present

## 2022-06-18 DIAGNOSIS — Z5111 Encounter for antineoplastic chemotherapy: Secondary | ICD-10-CM | POA: Diagnosis not present

## 2022-06-18 LAB — CBC WITH DIFFERENTIAL (CANCER CENTER ONLY)
Abs Immature Granulocytes: 0.01 10*3/uL (ref 0.00–0.07)
Basophils Absolute: 0 10*3/uL (ref 0.0–0.1)
Basophils Relative: 0 %
Eosinophils Absolute: 0.1 10*3/uL (ref 0.0–0.5)
Eosinophils Relative: 4 %
HCT: 37 % (ref 36.0–46.0)
Hemoglobin: 13.1 g/dL (ref 12.0–15.0)
Immature Granulocytes: 0 %
Lymphocytes Relative: 38 %
Lymphs Abs: 1.2 10*3/uL (ref 0.7–4.0)
MCH: 33.2 pg (ref 26.0–34.0)
MCHC: 35.4 g/dL (ref 30.0–36.0)
MCV: 93.9 fL (ref 80.0–100.0)
Monocytes Absolute: 0.3 10*3/uL (ref 0.1–1.0)
Monocytes Relative: 10 %
Neutro Abs: 1.5 10*3/uL — ABNORMAL LOW (ref 1.7–7.7)
Neutrophils Relative %: 48 %
Platelet Count: 136 10*3/uL — ABNORMAL LOW (ref 150–400)
RBC: 3.94 MIL/uL (ref 3.87–5.11)
RDW: 13.2 % (ref 11.5–15.5)
WBC Count: 3.1 10*3/uL — ABNORMAL LOW (ref 4.0–10.5)
nRBC: 0 % (ref 0.0–0.2)

## 2022-06-18 LAB — CMP (CANCER CENTER ONLY)
ALT: 11 U/L (ref 0–44)
AST: 17 U/L (ref 15–41)
Albumin: 3.9 g/dL (ref 3.5–5.0)
Alkaline Phosphatase: 65 U/L (ref 38–126)
Anion gap: 8 (ref 5–15)
BUN: 10 mg/dL (ref 8–23)
CO2: 24 mmol/L (ref 22–32)
Calcium: 9.3 mg/dL (ref 8.9–10.3)
Chloride: 101 mmol/L (ref 98–111)
Creatinine: 1.01 mg/dL — ABNORMAL HIGH (ref 0.44–1.00)
GFR, Estimated: 58 mL/min — ABNORMAL LOW (ref >60)
Glucose, Bld: 140 mg/dL — ABNORMAL HIGH (ref 70–99)
Potassium: 3.7 mmol/L (ref 3.5–5.1)
Sodium: 133 mmol/L — ABNORMAL LOW (ref 135–145)
Total Bilirubin: 0.4 mg/dL (ref 0.3–1.2)
Total Protein: 6.2 g/dL — ABNORMAL LOW (ref 6.5–8.1)

## 2022-06-18 MED ORDER — DEXTROSE 5 % IV SOLN: Freq: Once | INTRAVENOUS | Status: AC

## 2022-06-18 MED ORDER — LEUCOVORIN CALCIUM INJECTION 350 MG
400.0000 mg/m2 | Freq: Once | INTRAVENOUS | Status: AC
  Administered 2022-06-18: 688 mg via INTRAVENOUS
  Filled 2022-06-18: qty 34.4

## 2022-06-18 MED ORDER — OXALIPLATIN CHEMO INJECTION 100 MG/20ML
65.0000 mg/m2 | Freq: Once | INTRAVENOUS | Status: AC
  Administered 2022-06-18: 110 mg via INTRAVENOUS
  Filled 2022-06-18: qty 10

## 2022-06-18 MED ORDER — FLUOROURACIL CHEMO INJECTION 2.5 GM/50ML
400.0000 mg/m2 | Freq: Once | INTRAVENOUS | Status: AC
  Administered 2022-06-18: 700 mg via INTRAVENOUS
  Filled 2022-06-18: qty 14

## 2022-06-18 MED ORDER — PALONOSETRON HCL INJECTION 0.25 MG/5ML
0.2500 mg | Freq: Once | INTRAVENOUS | Status: AC
  Administered 2022-06-18: 0.25 mg via INTRAVENOUS
  Filled 2022-06-18: qty 5

## 2022-06-18 MED ORDER — SODIUM CHLORIDE 0.9 % IV SOLN
2400.0000 mg/m2 | INTRAVENOUS | Status: DC
  Administered 2022-06-18: 4150 mg via INTRAVENOUS
  Filled 2022-06-18: qty 83

## 2022-06-18 MED ORDER — SODIUM CHLORIDE 0.9 % IV SOLN
10.0000 mg | Freq: Once | INTRAVENOUS | Status: AC
  Administered 2022-06-18: 10 mg via INTRAVENOUS
  Filled 2022-06-18: qty 10

## 2022-06-18 NOTE — Progress Notes (Signed)
[facility] OFFICE PROGRESS NOTE   Diagnosis: Esophagus cancer  INTERVAL HISTORY:   Alison Garner returns as scheduled.  She completed cycle 1 FOLFOX 06/04/2022.  She denies nausea/vomiting.  Mouth was mildly tender, no ulcers.  No diarrhea.  She avoided cold as much as she could for the first 2 to 3 days.  When she was leaving the office on 06/04/2022 she thinks she inhaled cold air.  She became hoarse and had difficulty talking.  She came back to the office and reports symptoms resolved very quickly with a warm towel.  Objective:  Vital signs in last 24 hours:  Blood pressure (!) 115/58, pulse 92, temperature 98.1 F (36.7 C), resp. rate 18, height 5\' 5"  (1.651 m), weight 138 lb 14.4 oz (63 kg), SpO2 98 %.    HEENT: No thrush or ulcers. Resp: Lungs clear bilaterally. Cardio: Regular rate and rhythm. GI: No hepatosplenomegaly. Vascular: No leg edema. Skin: Palms without erythema. Port-A-Cath without erythema.  Lab Results:  Lab Results  Component Value Date   WBC 3.1 (L) 06/18/2022   HGB 13.1 06/18/2022   HCT 37.0 06/18/2022   MCV 93.9 06/18/2022   PLT 136 (L) 06/18/2022   NEUTROABS 1.5 (L) 06/18/2022    Imaging:  No results found.  Medications: I have reviewed the patient's current medications.  Assessment/Plan: Distal esophagus cancer Esophagram 11/21/2020-moderate to severe esophageal dysmotility, narrowing of the distal esophagus just above the GE junction to 4 mm, 13 mm barium tablet stuck in the distal esophagus Upper endoscopy 12/13/2020-severe esophagitis with nodularity and stricturing at 36 cm-adenocarcinoma, HER2 negative, no loss of mismatch repair protein expression, PD-L1 combined positive score less than 1% CT chest 12/14/2020-accentuated density at the distal esophageal lumen above a small type I hiatal hernia concerning for malignancy, nonpathologically enlarged AP window and right hilar nodes PET 12/21/2020-abnormal FDG uptake at the  distal esophagus, no evidence of nodal or distant metastatic disease Radiation 01/08/2021-02/16/2021 Cycle 1 weekly Taxol/carboplatin 01/08/2021 Cycle 2 weekly Taxol/carboplatin 01/15/2021 Treatment held 01/22/2021 due to neutropenia Positive COVID test 02/02/2021 Taxol/carboplatin 02/15/2021 Endoscopy 04/30/2021-shallow ulceration at the distal esophagus, nodularity at the gastric cardia-biopsies negative for malignancy CTs 04/02/2022-small mediastinal and right hilar lymph nodes.  Esophagus is mildly patulous decreased from previous in the mid to upper aspect of the mediastinum.  More distally the wall thickening of the esophagus appears slightly decreased.  Small hiatal hernia.  The area of thickening does correspond to the abnormality on PET-CT.  Emphysema. Endoscopy 04/15/2022-Short segment salmon-colored mucosa 1 cm above the Z-line.  Scarring at the gastroesophageal junction/Z-line with nonobstructing narrowing.  Nodular mucosa in the gastric cardia immediately distal to the Z-line.  Pathology on GE junction nodule-invasive moderately differentiated adenocarcinoma, tumor arises within a background of high-grade dysplasia, mild chronic active gastritis with foveolar hyperplasia, negative for squamous epithelium and intestinal metaplasia.  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  Negative for HER2 by IHC, PD-L1 CPS 2% PET scan 04/25/2022-increased radiotracer uptake associated with the distal esophagus at the level of the GE junction.  No evidence of nodal metastasis or distant metastatic disease. Cycle 1 FOLFOX 06/04/2022 Solid dysphagia secondary #1, resolved COPD on chest CT 12/14/2020 Hemorrhoids Hyperlipidemia Irritable bowel syndrome Hypothyroidism Tobacco use  Disposition: Alison Garner appears stable.  She has completed 1 cycle of FOLFOX.  She tolerated well overall.  The hoarseness and difficulty talking after leaving the office day 1 was likely acute  laryngopharyngeal dysesthesia occurring after cold exposure.  Symptoms resolved quickly  with warming.  CBC from today shows mild neutropenia and mild thrombocytopenia.  Plan to add white cell growth factor support on day of pump discontinuation and dose reduce oxaliplatin.  I reviewed potential toxicities associated with white cell growth factor support with Alison Garner including bone pain, rash, splenic rupture.  She agrees to proceed.  Plan to proceed with cycle 2 FOLFOX today as scheduled.  She will return for lab, follow-up, cycle 3 FOLFOX in 2 weeks.  We are available to see her sooner if needed.  Neutropenic and thrombocytopenic precautions reviewed with her at today's visit.   Alison Garner ANP/GNP-BC   06/18/2022  8:38 AM

## 2022-06-18 NOTE — Patient Instructions (Signed)
[place] [place] [facility]  The chemotherapy medication bag should finish at 46 hours, 96 hours, or 7 days. For example, if your pump is scheduled for 46 hours and it was put on at 4:00 p.m., it should finish at 2:00 p.m. the day it is scheduled to come off regardless of your appointment time.     Estimated time to finish at 11:00 Thursday, Jun 20, 2022.   If the display on your pump reads "Low Volume" and it is beeping, take the batteries out of the pump and come to the cancer center for it to be taken off.   If the pump alarms go off prior to the pump reading "Low Volume" then call [phone] and someone can assist you.  If the plunger comes out and the chemotherapy medication is leaking out, please use your home chemo spill kit to clean up the spill. Do NOT use paper towels or other household products.  If you have problems or questions regarding your pump, please call either [phone] (24 hours a day) or the cancer center Monday-Friday 8:00 a.m.- 4:30 p.m. at the clinic number and we will assist you. If you are unable to get assistance, then go to the nearest Emergency Department and ask the staff to contact the IV team for assistance.   Discharge Instructions: Thank you for choosing [facility] to provide your oncology and hematology care.   If you have a lab appointment with the Cancer Center, please go directly to the Cancer Center and check in at the registration area.   Wear comfortable clothing and clothing appropriate for easy access to any Portacath or PICC line.   We strive to give you quality time with your provider. You may need to reschedule your appointment if you arrive late (15 or more minutes).  Arriving late affects you and other patients whose appointments are after yours.  Also, if you miss three or more appointments without notifying the office, you may be dismissed from the clinic at the provider's discretion.      For  prescription refill requests, have your pharmacy contact our office and allow 72 hours for refills to be completed.    Today you received the following chemotherapy and/or immunotherapy agents Oxaliplatin, Leucovorin, Fluorouracil.      To help prevent nausea and vomiting after your treatment, we encourage you to take your nausea medication as directed.  BELOW ARE SYMPTOMS THAT SHOULD BE REPORTED IMMEDIATELY: *FEVER GREATER THAN 100.4 F (38 C) OR HIGHER *CHILLS OR SWEATING *NAUSEA AND VOMITING THAT IS NOT CONTROLLED WITH YOUR NAUSEA MEDICATION *UNUSUAL SHORTNESS OF BREATH *UNUSUAL BRUISING OR BLEEDING *URINARY PROBLEMS (pain or burning when urinating, or frequent urination) *BOWEL PROBLEMS (unusual diarrhea, constipation, pain near the anus) TENDERNESS IN MOUTH AND THROAT WITH OR WITHOUT PRESENCE OF ULCERS (sore throat, sores in mouth, or a toothache) UNUSUAL RASH, SWELLING OR PAIN  UNUSUAL VAGINAL DISCHARGE OR ITCHING   Items with * indicate a potential emergency and should be followed up as soon as possible or go to the Emergency Department if any problems should occur.  Please show the CHEMOTHERAPY ALERT CARD or IMMUNOTHERAPY ALERT CARD at check-in to the Emergency Department and triage nurse.  Should you have questions after your visit or need to cancel or reschedule your appointment, please contact [place] [facility]  Dept: [phone]  and follow the prompts.  Office hours are 8:00 a.m. to 4:30 p.m. Monday - Friday. Please note  that voicemails left after 4:00 p.m. may not be returned until the following business day.  We are closed weekends and major holidays. You have access to a nurse at all times for urgent questions. Please call the main number to the clinic Dept: [phone] and follow the prompts.   For any non-urgent questions, you may also contact your provider using MyChart. We now offer e-Visits for anyone 18 and older to request care online  for non-urgent symptoms. For details visit [email or web address].   Also download the MyChart app! Go to the app store, search "MyChart", open the app, select [facility], and log in with your MyChart username and password.  Oxaliplatin Injection What is this medication? OXALIPLATIN (ox AL i PLA tin) treats colorectal cancer. It works by slowing down the growth of cancer cells. This medicine may be used for other purposes; ask your health care provider or pharmacist if you have questions. COMMON BRAND NAME(S): Eloxatin What should I tell my care team before I take this medication? They need to know if you have any of these conditions: Heart disease History of irregular heartbeat or rhythm Liver disease Low blood cell levels (white cells, red cells, and platelets) Lung or breathing disease, such as asthma Take medications that treat or prevent blood clots Tingling of the fingers, toes, or other nerve disorder An unusual or allergic reaction to oxaliplatin, other medications, foods, dyes, or preservatives If you or your partner are pregnant or trying to get pregnant Breast-feeding How should I use this medication? This medication is injected into a vein. It is given by your care team in a hospital or clinic setting. Talk to your care team about the use of this medication in children. Special care may be needed. Overdosage: If you think you have taken too much of this medicine contact a poison control center or emergency room at once. NOTE: This medicine is only for you. Do not share this medicine with others. What if I miss a dose? Keep appointments for follow-up doses. It is important not to miss a dose. Call your care team if you are unable to keep an appointment. What may interact with this medication? Do not take this medication with any of the following: Cisapride Dronedarone Pimozide Thioridazine This medication may also interact with the following: Aspirin and aspirin-like  medications Certain medications that treat or prevent blood clots, such as warfarin, apixaban, dabigatran, and rivaroxaban Cisplatin Cyclosporine Diuretics Medications for infection, such as acyclovir, adefovir, amphotericin B, bacitracin, cidofovir, foscarnet, ganciclovir, gentamicin, pentamidine, vancomycin NSAIDs, medications for pain and inflammation, such as ibuprofen or naproxen Other medications that cause heart rhythm changes Pamidronate Zoledronic acid This list may not describe all possible interactions. Give your health care provider a list of all the medicines, herbs, non-prescription drugs, or dietary supplements you use. Also tell them if you smoke, drink alcohol, or use illegal drugs. Some items may interact with your medicine. What should I watch for while using this medication? Your condition will be monitored carefully while you are receiving this medication. You may need blood work while taking this medication. This medication may make you feel generally unwell. This is not uncommon as chemotherapy can affect healthy cells as well as cancer cells. Report any side effects. Continue your course of treatment even though you feel ill unless your care team tells you to stop. This medication may increase your risk of getting an infection. Call your care team for advice if you get a fever, chills, sore throat, or   other symptoms of a cold or flu. Do not treat yourself. Try to avoid being around people who are sick. Avoid taking medications that contain aspirin, acetaminophen, ibuprofen, naproxen, or ketoprofen unless instructed by your care team. These medications may hide a fever. Be careful brushing or flossing your teeth or using a toothpick because you may get an infection or bleed more easily. If you have any dental work done, tell your dentist you are receiving this medication. This medication can make you more sensitive to cold. Do not drink cold drinks or use ice. Cover exposed  skin before coming in contact with cold temperatures or cold objects. When out in cold weather wear warm clothing and cover your mouth and nose to warm the air that goes into your lungs. Tell your care team if you get sensitive to the cold. Talk to your care team if you or your partner are pregnant or think either of you might be pregnant. This medication can cause serious birth defects if taken during pregnancy and for 9 months after the last dose. A negative pregnancy test is required before starting this medication. A reliable form of contraception is recommended while taking this medication and for 9 months after the last dose. Talk to your care team about effective forms of contraception. Do not father a child while taking this medication and for 6 months after the last dose. Use a condom while having sex during this time period. Do not breastfeed while taking this medication and for 3 months after the last dose. This medication may cause infertility. Talk to your care team if you are concerned about your fertility. What side effects may I notice from receiving this medication? Side effects that you should report to your care team as soon as possible: Allergic reactions--skin rash, itching, hives, swelling of the face, lips, tongue, or throat Bleeding--bloody or black, tar-like stools, vomiting blood or brown material that looks like coffee grounds, red or dark brown urine, small red or purple spots on skin, unusual bruising or bleeding Dry cough, shortness of breath or trouble breathing Heart rhythm changes--fast or irregular heartbeat, dizziness, feeling faint or lightheaded, chest pain, trouble breathing Infection--fever, chills, cough, sore throat, wounds that don't heal, pain or trouble when passing urine, general feeling of discomfort or being unwell Liver injury--right upper belly pain, loss of appetite, nausea, light-colored stool, dark yellow or brown urine, yellowing skin or eyes, unusual  weakness or fatigue Low red blood cell level--unusual weakness or fatigue, dizziness, headache, trouble breathing Muscle injury--unusual weakness or fatigue, muscle pain, dark yellow or brown urine, decrease in amount of urine Pain, tingling, or numbness in the hands or feet Sudden and severe headache, confusion, change in vision, seizures, which may be signs of posterior reversible encephalopathy syndrome (PRES) Unusual bruising or bleeding Side effects that usually do not require medical attention (report to your care team if they continue or are bothersome): Diarrhea Nausea Pain, redness, or swelling with sores inside the mouth or throat Unusual weakness or fatigue Vomiting This list may not describe all possible side effects. Call your doctor for medical advice about side effects. You may report side effects to FDA at 1-800-FDA-1088. Where should I keep my medication? This medication is given in a hospital or clinic. It will not be stored at home. NOTE: This sheet is a summary. It may not cover all possible information. If you have questions about this medicine, talk to your doctor, pharmacist, or health care provider.  2023 Elsevier/Gold Standard (  2007-03-14 00:00:00) Leucovorin Injection What is this medication? LEUCOVORIN (loo koe VOR in) prevents side effects from certain medications, such as methotrexate. It works by increasing folate levels. This helps protect healthy cells in your body. It may also be used to treat anemia caused by low levels of folate. It can also be used with fluorouracil, a type of chemotherapy, to treat colorectal cancer. It works by increasing the effects of fluorouracil in the body. This medicine may be used for other purposes; ask your health care provider or pharmacist if you have questions. What should I tell my care team before I take this medication? They need to know if you have any of these conditions: Anemia from low levels of vitamin B12 in the  blood An unusual or allergic reaction to leucovorin, folic acid, other medications, foods, dyes, or preservatives Pregnant or trying to get pregnant Breastfeeding How should I use this medication? This medication is injected into a vein or a muscle. It is given by your care team in a hospital or clinic setting. Talk to your care team about the use of this medication in children. Special care may be needed. Overdosage: If you think you have taken too much of this medicine contact a poison control center or emergency room at once. NOTE: This medicine is only for you. Do not share this medicine with others. What if I miss a dose? Keep appointments for follow-up doses. It is important not to miss your dose. Call your care team if you are unable to keep an appointment. What may interact with this medication? Capecitabine Fluorouracil Phenobarbital Phenytoin Primidone Trimethoprim;sulfamethoxazole This list may not describe all possible interactions. Give your health care provider a list of all the medicines, herbs, non-prescription drugs, or dietary supplements you use. Also tell them if you smoke, drink alcohol, or use illegal drugs. Some items may interact with your medicine. What should I watch for while using this medication? Your condition will be monitored carefully while you are receiving this medication. This medication may increase the side effects of 5-fluorouracil. Tell your care team if you have diarrhea or mouth sores that do not get better or that get worse. What side effects may I notice from receiving this medication? Side effects that you should report to your care team as soon as possible: Allergic reactions--skin rash, itching, hives, swelling of the face, lips, tongue, or throat This list may not describe all possible side effects. Call your doctor for medical advice about side effects. You may report side effects to FDA at 1-800-FDA-1088. Where should I keep my  medication? This medication is given in a hospital or clinic. It will not be stored at home. NOTE: This sheet is a summary. It may not cover all possible information. If you have questions about this medicine, talk to your doctor, pharmacist, or health care provider.  2023 Elsevier/Gold Standard (2021-06-01 00:00:00) Fluorouracil Injection What is this medication? FLUOROURACIL (flure oh YOOR a sil) treats some types of cancer. It works by slowing down the growth of cancer cells. This medicine may be used for other purposes; ask your health care provider or pharmacist if you have questions. COMMON BRAND NAME(S): Adrucil What should I tell my care team before I take this medication? They need to know if you have any of these conditions: Blood disorders Dihydropyrimidine dehydrogenase (DPD) deficiency Infection, such as chickenpox, cold sores, herpes Kidney disease Liver disease Poor nutrition Recent or ongoing radiation therapy An unusual or allergic reaction to fluorouracil, other  medications, foods, dyes, or preservatives If you or your partner are pregnant or trying to get pregnant Breast-feeding How should I use this medication? This medication is injected into a vein. It is administered by your care team in a hospital or clinic setting. Talk to your care team about the use of this medication in children. Special care may be needed. Overdosage: If you think you have taken too much of this medicine contact a poison control center or emergency room at once. NOTE: This medicine is only for you. Do not share this medicine with others. What if I miss a dose? Keep appointments for follow-up doses. It is important not to miss your dose. Call your care team if you are unable to keep an appointment. What may interact with this medication? Do not take this medication with any of the following: Live virus vaccines This medication may also interact with the following: Medications that treat or  prevent blood clots, such as warfarin, enoxaparin, dalteparin This list may not describe all possible interactions. Give your health care provider a list of all the medicines, herbs, non-prescription drugs, or dietary supplements you use. Also tell them if you smoke, drink alcohol, or use illegal drugs. Some items may interact with your medicine. What should I watch for while using this medication? Your condition will be monitored carefully while you are receiving this medication. This medication may make you feel generally unwell. This is not uncommon as chemotherapy can affect healthy cells as well as cancer cells. Report any side effects. Continue your course of treatment even though you feel ill unless your care team tells you to stop. In some cases, you may be given additional medications to help with side effects. Follow all directions for their use. This medication may increase your risk of getting an infection. Call your care team for advice if you get a fever, chills, sore throat, or other symptoms of a cold or flu. Do not treat yourself. Try to avoid being around people who are sick. This medication may increase your risk to bruise or bleed. Call your care team if you notice any unusual bleeding. Be careful brushing or flossing your teeth or using a toothpick because you may get an infection or bleed more easily. If you have any dental work done, tell your dentist you are receiving this medication. Avoid taking medications that contain aspirin, acetaminophen, ibuprofen, naproxen, or ketoprofen unless instructed by your care team. These medications may hide a fever. Do not treat diarrhea with over the counter products. Contact your care team if you have diarrhea that lasts more than 2 days or if it is severe and watery. This medication can make you more sensitive to the sun. Keep out of the sun. If you cannot avoid being in the sun, wear protective clothing and sunscreen. Do not use sun lamps,  tanning beds, or tanning booths. Talk to your care team if you or your partner wish to become pregnant or think you might be pregnant. This medication can cause serious birth defects if taken during pregnancy and for 3 months after the last dose. A reliable form of contraception is recommended while taking this medication and for 3 months after the last dose. Talk to your care team about effective forms of contraception. Do not father a child while taking this medication and for 3 months after the last dose. Use a condom while having sex during this time period. Do not breastfeed while taking this medication. This medication may cause  infertility. Talk to your care team if you are concerned about your fertility. What side effects may I notice from receiving this medication? Side effects that you should report to your care team as soon as possible: Allergic reactions--skin rash, itching, hives, swelling of the face, lips, tongue, or throat Heart attack--pain or tightness in the chest, shoulders, arms, or jaw, nausea, shortness of breath, cold or clammy skin, feeling faint or lightheaded Heart failure--shortness of breath, swelling of the ankles, feet, or hands, sudden weight gain, unusual weakness or fatigue Heart rhythm changes--fast or irregular heartbeat, dizziness, feeling faint or lightheaded, chest pain, trouble breathing High ammonia level--unusual weakness or fatigue, confusion, loss of appetite, nausea, vomiting, seizures Infection--fever, chills, cough, sore throat, wounds that don't heal, pain or trouble when passing urine, general feeling of discomfort or being unwell Low red blood cell level--unusual weakness or fatigue, dizziness, headache, trouble breathing Pain, tingling, or numbness in the hands or feet, muscle weakness, change in vision, confusion or trouble speaking, loss of balance or coordination, trouble walking, seizures Redness, swelling, and blistering of the skin over hands and  feet Severe or prolonged diarrhea Unusual bruising or bleeding Side effects that usually do not require medical attention (report to your care team if they continue or are bothersome): Dry skin Headache Increased tears Nausea Pain, redness, or swelling with sores inside the mouth or throat Sensitivity to light Vomiting This list may not describe all possible side effects. Call your doctor for medical advice about side effects. You may report side effects to FDA at 1-800-FDA-1088. Where should I keep my medication? This medication is given in a hospital or clinic. It will not be stored at home. NOTE: This sheet is a summary. It may not cover all possible information. If you have questions about this medicine, talk to your doctor, pharmacist, or health care provider.  2023 Elsevier/Gold Standard (2021-05-22 00:00:00)

## 2022-06-18 NOTE — Progress Notes (Signed)
Patient seen by [name] NP today  Vitals are within treatment parameters.  Labs reviewed by [name] NP and are not all within treatment parameters. WBC 3.1 and Neut. 1.5 Patient will received Udenyca on the day of pump d/c  Per physician team, patient is ready for treatment. Please note that modifications are being made to the treatment plan including reduce oxaliplatin to 65 mg.

## 2022-06-20 ENCOUNTER — Inpatient Hospital Stay: Payer: Medicare Other

## 2022-06-20 VITALS — BP 122/63 | HR 95 | Temp 98.5°F | Resp 18

## 2022-06-20 DIAGNOSIS — Z5111 Encounter for antineoplastic chemotherapy: Secondary | ICD-10-CM | POA: Diagnosis not present

## 2022-06-20 DIAGNOSIS — C155 Malignant neoplasm of lower third of esophagus: Secondary | ICD-10-CM

## 2022-06-20 MED ORDER — SODIUM CHLORIDE 0.9% FLUSH
10.0000 mL | INTRAVENOUS | Status: DC | PRN
  Administered 2022-06-20: 10 mL

## 2022-06-20 MED ORDER — PEGFILGRASTIM-CBQV 6 MG/0.6ML ~~LOC~~ SOSY
6.0000 mg | PREFILLED_SYRINGE | Freq: Once | SUBCUTANEOUS | Status: AC
  Administered 2022-06-20: 6 mg via SUBCUTANEOUS
  Filled 2022-06-20: qty 0.6

## 2022-06-20 MED ORDER — HEPARIN SOD (PORK) LOCK FLUSH 100 UNIT/ML IV SOLN
500.0000 [IU] | Freq: Once | INTRAVENOUS | Status: AC | PRN
  Administered 2022-06-20: 500 [IU]

## 2022-06-20 NOTE — Patient Instructions (Signed)

## 2022-06-21 ENCOUNTER — Telehealth: Payer: Self-pay

## 2022-06-21 ENCOUNTER — Telehealth: Payer: Self-pay | Admitting: Nurse Practitioner

## 2022-06-21 ENCOUNTER — Encounter: Payer: Self-pay | Admitting: Nurse Practitioner

## 2022-06-21 NOTE — Telephone Encounter (Signed)
A patient contacted the office to report symptoms consistent with hand and foot syndrome. She described redness and swelling in her hands, particularly in the right thumb which is causing significant pain. She also noted pain in the soles of her feet. The patient first noticed these symptoms yesterday and has experienced a worsening of symptoms this morning. She would like to schedule an appointment to see a healthcare provider.

## 2022-06-21 NOTE — Telephone Encounter (Signed)
I called Ms. Gallaga to follow-up on her earlier message.  She completed cycle 2 FOLFOX beginning 06/18/2022.  Yesterday she noted previous existing calluses and bunions on her feet were sore.  She thought this was due to the shoes she was wearing.  As the day progressed she noted some redness surrounding the calluses and bunions.  She also noticed fingers and toes appeared mildly swollen and she had a burning sensation on the palms and soles.  Symptoms have improved throughout the morning.  There is less redness, less swelling and less pain.  She is applying lotion liberally.  No mouth sores or diarrhea.  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  She will continue supportive care with the lotion, avoid hot water exposure.  We discussed a study with diclofenac gel that showed reduced rates of hand-foot syndrome in people taking Xeloda.  For now she plans to continue what she is currently doing.  She understands to seek evaluation if symptoms worsen over the weekend.  She will call us Monday morning, 06/24/2022, with an update.

## 2022-06-25 ENCOUNTER — Telehealth: Payer: Self-pay

## 2022-06-25 NOTE — Telephone Encounter (Signed)
Alison Garner has shown improvement in the condition of her hands and feet. The redness in her hands has subsided and the tenderness in her feet has also decreased. She is feeling significantly better. I have informed Alison Garner that I will relay this information to the healthcare provider.

## 2022-07-01 ENCOUNTER — Other Ambulatory Visit: Payer: Self-pay | Admitting: Oncology

## 2022-07-01 DIAGNOSIS — C155 Malignant neoplasm of lower third of esophagus: Secondary | ICD-10-CM

## 2022-07-02 ENCOUNTER — Encounter: Payer: Self-pay | Admitting: *Deleted

## 2022-07-02 ENCOUNTER — Inpatient Hospital Stay: Payer: Medicare Other

## 2022-07-02 ENCOUNTER — Inpatient Hospital Stay (HOSPITAL_BASED_OUTPATIENT_CLINIC_OR_DEPARTMENT_OTHER): Payer: Medicare Other | Admitting: Oncology

## 2022-07-02 ENCOUNTER — Other Ambulatory Visit (HOSPITAL_BASED_OUTPATIENT_CLINIC_OR_DEPARTMENT_OTHER): Payer: Self-pay

## 2022-07-02 VITALS — BP 152/69 | HR 78

## 2022-07-02 VITALS — BP 103/58 | HR 94 | Temp 98.1°F | Resp 20 | Ht 65.0 in | Wt 136.8 lb

## 2022-07-02 DIAGNOSIS — C155 Malignant neoplasm of lower third of esophagus: Secondary | ICD-10-CM

## 2022-07-02 DIAGNOSIS — Z5111 Encounter for antineoplastic chemotherapy: Secondary | ICD-10-CM | POA: Diagnosis not present

## 2022-07-02 LAB — CMP (CANCER CENTER ONLY)
ALT: 21 U/L (ref 0–44)
AST: 24 U/L (ref 15–41)
Albumin: 3.9 g/dL (ref 3.5–5.0)
Alkaline Phosphatase: 82 U/L (ref 38–126)
Anion gap: 8 (ref 5–15)
BUN: 9 mg/dL (ref 8–23)
CO2: 26 mmol/L (ref 22–32)
Calcium: 9.6 mg/dL (ref 8.9–10.3)
Chloride: 101 mmol/L (ref 98–111)
Creatinine: 1.03 mg/dL — ABNORMAL HIGH (ref 0.44–1.00)
GFR, Estimated: 57 mL/min — ABNORMAL LOW (ref >60)
Glucose, Bld: 102 mg/dL — ABNORMAL HIGH (ref 70–99)
Potassium: 3.9 mmol/L (ref 3.5–5.1)
Sodium: 135 mmol/L (ref 135–145)
Total Bilirubin: 0.5 mg/dL (ref 0.3–1.2)
Total Protein: 6.3 g/dL — ABNORMAL LOW (ref 6.5–8.1)

## 2022-07-02 LAB — CBC WITH DIFFERENTIAL (CANCER CENTER ONLY)
Abs Immature Granulocytes: 0.48 10*3/uL — ABNORMAL HIGH (ref 0.00–0.07)
Basophils Absolute: 0.1 10*3/uL (ref 0.0–0.1)
Basophils Relative: 1 %
Eosinophils Absolute: 0.1 10*3/uL (ref 0.0–0.5)
Eosinophils Relative: 1 %
HCT: 36.1 % (ref 36.0–46.0)
Hemoglobin: 12.7 g/dL (ref 12.0–15.0)
Immature Granulocytes: 6 %
Lymphocytes Relative: 23 %
Lymphs Abs: 1.7 10*3/uL (ref 0.7–4.0)
MCH: 33.3 pg (ref 26.0–34.0)
MCHC: 35.2 g/dL (ref 30.0–36.0)
MCV: 94.8 fL (ref 80.0–100.0)
Monocytes Absolute: 0.9 10*3/uL (ref 0.1–1.0)
Monocytes Relative: 12 %
Neutro Abs: 4.3 10*3/uL (ref 1.7–7.7)
Neutrophils Relative %: 57 %
Platelet Count: 85 10*3/uL — ABNORMAL LOW (ref 150–400)
RBC: 3.81 MIL/uL — ABNORMAL LOW (ref 3.87–5.11)
RDW: 16 % — ABNORMAL HIGH (ref 11.5–15.5)
WBC Count: 7.6 10*3/uL (ref 4.0–10.5)
nRBC: 0.9 % — ABNORMAL HIGH (ref 0.0–0.2)

## 2022-07-02 LAB — CEA (ACCESS): CEA (CHCC): 13.63 ng/mL — ABNORMAL HIGH (ref 0.00–5.00)

## 2022-07-02 MED ORDER — SODIUM CHLORIDE 0.9 % IV SOLN
1800.0000 mg/m2 | INTRAVENOUS | Status: DC
  Administered 2022-07-02: 3100 mg via INTRAVENOUS
  Filled 2022-07-02: qty 50

## 2022-07-02 MED ORDER — SODIUM CHLORIDE 0.9 % IV SOLN
10.0000 mg | Freq: Once | INTRAVENOUS | Status: AC
  Administered 2022-07-02: 10 mg via INTRAVENOUS
  Filled 2022-07-02: qty 10

## 2022-07-02 MED ORDER — OXALIPLATIN CHEMO INJECTION 100 MG/20ML
50.0000 mg/m2 | Freq: Once | INTRAVENOUS | Status: AC
  Administered 2022-07-02: 85 mg via INTRAVENOUS
  Filled 2022-07-02: qty 17

## 2022-07-02 MED ORDER — PALONOSETRON HCL INJECTION 0.25 MG/5ML
0.2500 mg | Freq: Once | INTRAVENOUS | Status: AC
  Administered 2022-07-02: 0.25 mg via INTRAVENOUS
  Filled 2022-07-02: qty 5

## 2022-07-02 MED ORDER — FLUOROURACIL CHEMO INJECTION 500 MG/10ML
300.0000 mg/m2 | Freq: Once | INTRAVENOUS | Status: AC
  Administered 2022-07-02: 500 mg via INTRAVENOUS
  Filled 2022-07-02: qty 10

## 2022-07-02 MED ORDER — DEXTROSE 5 % IV SOLN: Freq: Once | INTRAVENOUS | Status: AC

## 2022-07-02 MED ORDER — LEUCOVORIN CALCIUM INJECTION 350 MG
300.0000 mg/m2 | Freq: Once | INTRAVENOUS | Status: AC
  Administered 2022-07-02: 516 mg via INTRAVENOUS
  Filled 2022-07-02: qty 25.8

## 2022-07-02 NOTE — Patient Instructions (Signed)
[place] [facility]   The chemotherapy medication bag should finish at 46 hours, 96 hours, or 7 days. For example, if your pump is scheduled for 46 hours and it was put on at 4:00 p.m., it should finish at 2:00 p.m. the day it is scheduled to come off regardless of your appointment time.     Estimated time to finish at 12:15 Thursday, Jul 04, 2022.   If the display on your pump reads "Low Volume" and it is beeping, take the batteries out of the pump and come to the cancer center for it to be taken off.   If the pump alarms go off prior to the pump reading "Low Volume" then call [phone] and someone can assist you.  If the plunger comes out and the chemotherapy medication is leaking out, please use your home chemo spill kit to clean up the spill. Do NOT use paper towels or other household products.  If you have problems or questions regarding your pump, please call either [phone] (24 hours a day) or the cancer center Monday-Friday 8:00 a.m.- 4:30 p.m. at the clinic number and we will assist you. If you are unable to get assistance, then go to the nearest Emergency Department and ask the staff to contact the IV team for assistance.  Discharge Instructions: Thank you for choosing [facility] to provide your oncology and hematology care.   If you have a lab appointment with the Cancer Center, please go directly to the Cancer Center and check in at the registration area.   Wear comfortable clothing and clothing appropriate for easy access to any Portacath or PICC line.   We strive to give you quality time with your provider. You may need to reschedule your appointment if you arrive late (15 or more minutes).  Arriving late affects you and other patients whose appointments are after yours.  Also, if you miss three or more appointments without notifying the office, you may be dismissed from the clinic at the provider's discretion.      For  prescription refill requests, have your pharmacy contact our office and allow 72 hours for refills to be completed.    Today you received the following chemotherapy and/or immunotherapy agents Oxaliplatin, Leucovorin, Fluorouracil      To help prevent nausea and vomiting after your treatment, we encourage you to take your nausea medication as directed.  BELOW ARE SYMPTOMS THAT SHOULD BE REPORTED IMMEDIATELY: *FEVER GREATER THAN 100.4 F (38 C) OR HIGHER *CHILLS OR SWEATING *NAUSEA AND VOMITING THAT IS NOT CONTROLLED WITH YOUR NAUSEA MEDICATION *UNUSUAL SHORTNESS OF BREATH *UNUSUAL BRUISING OR BLEEDING *URINARY PROBLEMS (pain or burning when urinating, or frequent urination) *BOWEL PROBLEMS (unusual diarrhea, constipation, pain near the anus) TENDERNESS IN MOUTH AND THROAT WITH OR WITHOUT PRESENCE OF ULCERS (sore throat, sores in mouth, or a toothache) UNUSUAL RASH, SWELLING OR PAIN  UNUSUAL VAGINAL DISCHARGE OR ITCHING   Items with * indicate a potential emergency and should be followed up as soon as possible or go to the Emergency Department if any problems should occur.  Please show the CHEMOTHERAPY ALERT CARD or IMMUNOTHERAPY ALERT CARD at check-in to the Emergency Department and triage nurse.  Should you have questions after your visit or need to cancel or reschedule your appointment, please contact [place] [facility]  Dept: [phone]  and follow the prompts.  Office hours are 8:00 a.m. to 4:30 p.m. Monday - Friday. Please note  that voicemails left after 4:00 p.m. may not be returned until the following business day.  We are closed weekends and major holidays. You have access to a nurse at all times for urgent questions. Please call the main number to the clinic Dept: [phone] and follow the prompts.   For any non-urgent questions, you may also contact your provider using MyChart. We now offer e-Visits for anyone 86 and older to request care online for  non-urgent symptoms. For details visit [email or web address].   Also download the MyChart app! Go to the app store, search "MyChart", open the app, select [employer], and log in with your MyChart username and password.  Oxaliplatin Injection What is this medication? OXALIPLATIN (ox AL i PLA tin) treats colorectal cancer. It works by slowing down the growth of cancer cells. This medicine may be used for other purposes; ask your health care provider or pharmacist if you have questions. COMMON BRAND NAME(S): Eloxatin What should I tell my care team before I take this medication? They need to know if you have any of these conditions: Heart disease History of irregular heartbeat or rhythm Liver disease Low blood cell levels (white cells, red cells, and platelets) Lung or breathing disease, such as asthma Take medications that treat or prevent blood clots Tingling of the fingers, toes, or other nerve disorder An unusual or allergic reaction to oxaliplatin, other medications, foods, dyes, or preservatives If you or your partner are pregnant or trying to get pregnant Breast-feeding How should I use this medication? This medication is injected into a vein. It is given by your care team in a hospital or clinic setting. Talk to your care team about the use of this medication in children. Special care may be needed. Overdosage: If you think you have taken too much of this medicine contact a poison control center or emergency room at once. NOTE: This medicine is only for you. Do not share this medicine with others. What if I miss a dose? Keep appointments for follow-up doses. It is important not to miss a dose. Call your care team if you are unable to keep an appointment. What may interact with this medication? Do not take this medication with any of the following: Cisapride Dronedarone Pimozide Thioridazine This medication may also interact with the following: Aspirin and aspirin-like  medications Certain medications that treat or prevent blood clots, such as warfarin, apixaban, dabigatran, and rivaroxaban Cisplatin Cyclosporine Diuretics Medications for infection, such as acyclovir, adefovir, amphotericin B, bacitracin, cidofovir, foscarnet, ganciclovir, gentamicin, pentamidine, vancomycin NSAIDs, medications for pain and inflammation, such as ibuprofen or naproxen Other medications that cause heart rhythm changes Pamidronate Zoledronic acid This list may not describe all possible interactions. Give your health care provider a list of all the medicines, herbs, non-prescription drugs, or dietary supplements you use. Also tell them if you smoke, drink alcohol, or use illegal drugs. Some items may interact with your medicine. What should I watch for while using this medication? Your condition will be monitored carefully while you are receiving this medication. You may need blood work while taking this medication. This medication may make you feel generally unwell. This is not uncommon as chemotherapy can affect healthy cells as well as cancer cells. Report any side effects. Continue your course of treatment even though you feel ill unless your care team tells you to stop. This medication may increase your risk of getting an infection. Call your care team for advice if you get a fever, chills, sore throat, or  other symptoms of a cold or flu. Do not treat yourself. Try to avoid being around people who are sick. Avoid taking medications that contain aspirin, acetaminophen, ibuprofen, naproxen, or ketoprofen unless instructed by your care team. These medications may hide a fever. Be careful brushing or flossing your teeth or using a toothpick because you may get an infection or bleed more easily. If you have any dental work done, tell your dentist you are receiving this medication. This medication can make you more sensitive to cold. Do not drink cold drinks or use ice. Cover exposed  skin before coming in contact with cold temperatures or cold objects. When out in cold weather wear warm clothing and cover your mouth and nose to warm the air that goes into your lungs. Tell your care team if you get sensitive to the cold. Talk to your care team if you or your partner are pregnant or think either of you might be pregnant. This medication can cause serious birth defects if taken during pregnancy and for 9 months after the last dose. A negative pregnancy test is required before starting this medication. A reliable form of contraception is recommended while taking this medication and for 9 months after the last dose. Talk to your care team about effective forms of contraception. Do not father a child while taking this medication and for 6 months after the last dose. Use a condom while having sex during this time period. Do not breastfeed while taking this medication and for 3 months after the last dose. This medication may cause infertility. Talk to your care team if you are concerned about your fertility. What side effects may I notice from receiving this medication? Side effects that you should report to your care team as soon as possible: Allergic reactions--skin rash, itching, hives, swelling of the face, lips, tongue, or throat Bleeding--bloody or black, tar-like stools, vomiting blood or brown material that looks like coffee grounds, red or dark brown urine, small red or purple spots on skin, unusual bruising or bleeding Dry cough, shortness of breath or trouble breathing Heart rhythm changes--fast or irregular heartbeat, dizziness, feeling faint or lightheaded, chest pain, trouble breathing Infection--fever, chills, cough, sore throat, wounds that don't heal, pain or trouble when passing urine, general feeling of discomfort or being unwell Liver injury--right upper belly pain, loss of appetite, nausea, light-colored stool, dark yellow or brown urine, yellowing skin or eyes, unusual  weakness or fatigue Low red blood cell level--unusual weakness or fatigue, dizziness, headache, trouble breathing Muscle injury--unusual weakness or fatigue, muscle pain, dark yellow or brown urine, decrease in amount of urine Pain, tingling, or numbness in the hands or feet Sudden and severe headache, confusion, change in vision, seizures, which may be signs of posterior reversible encephalopathy syndrome (PRES) Unusual bruising or bleeding Side effects that usually do not require medical attention (report to your care team if they continue or are bothersome): Diarrhea Nausea Pain, redness, or swelling with sores inside the mouth or throat Unusual weakness or fatigue Vomiting This list may not describe all possible side effects. Call your doctor for medical advice about side effects. You may report side effects to FDA at 1-800-FDA-1088. Where should I keep my medication? This medication is given in a hospital or clinic. It will not be stored at home. NOTE: This sheet is a summary. It may not cover all possible information. If you have questions about this medicine, talk to your doctor, pharmacist, or health care provider.  2024 Elsevier/Gold Standard (  2022-03-31 00:00:00)  Leucovorin Injection What is this medication? LEUCOVORIN (loo koe VOR in) prevents side effects from certain medications, such as methotrexate. It works by increasing folate levels. This helps protect healthy cells in your body. It may also be used to treat anemia caused by low levels of folate. It can also be used with fluorouracil, a type of chemotherapy, to treat colorectal cancer. It works by increasing the effects of fluorouracil in the body. This medicine may be used for other purposes; ask your health care provider or pharmacist if you have questions. What should I tell my care team before I take this medication? They need to know if you have any of these conditions: Anemia from low levels of vitamin B12 in the  blood An unusual or allergic reaction to leucovorin, folic acid, other medications, foods, dyes, or preservatives Pregnant or trying to get pregnant Breastfeeding How should I use this medication? This medication is injected into a vein or a muscle. It is given by your care team in a hospital or clinic setting. Talk to your care team about the use of this medication in children. Special care may be needed. Overdosage: If you think you have taken too much of this medicine contact a poison control center or emergency room at once. NOTE: This medicine is only for you. Do not share this medicine with others. What if I miss a dose? Keep appointments for follow-up doses. It is important not to miss your dose. Call your care team if you are unable to keep an appointment. What may interact with this medication? Capecitabine Fluorouracil Phenobarbital Phenytoin Primidone Trimethoprim;sulfamethoxazole This list may not describe all possible interactions. Give your health care provider a list of all the medicines, herbs, non-prescription drugs, or dietary supplements you use. Also tell them if you smoke, drink alcohol, or use illegal drugs. Some items may interact with your medicine. What should I watch for while using this medication? Your condition will be monitored carefully while you are receiving this medication. This medication may increase the side effects of 5-fluorouracil. Tell your care team if you have diarrhea or mouth sores that do not get better or that get worse. What side effects may I notice from receiving this medication? Side effects that you should report to your care team as soon as possible: Allergic reactions--skin rash, itching, hives, swelling of the face, lips, tongue, or throat This list may not describe all possible side effects. Call your doctor for medical advice about side effects. You may report side effects to FDA at 1-800-FDA-1088. Where should I keep my  medication? This medication is given in a hospital or clinic. It will not be stored at home. NOTE: This sheet is a summary. It may not cover all possible information. If you have questions about this medicine, talk to your doctor, pharmacist, or health care provider.  2024 Elsevier/Gold Standard (2021-06-26 00:00:00)  Fluorouracil Injection What is this medication? FLUOROURACIL (flure oh YOOR a sil) treats some types of cancer. It works by slowing down the growth of cancer cells. This medicine may be used for other purposes; ask your health care provider or pharmacist if you have questions. COMMON BRAND NAME(S): Adrucil What should I tell my care team before I take this medication? They need to know if you have any of these conditions: Blood disorders Dihydropyrimidine dehydrogenase (DPD) deficiency Infection, such as chickenpox, cold sores, herpes Kidney disease Liver disease Poor nutrition Recent or ongoing radiation therapy An unusual or allergic reaction to  fluorouracil, other medications, foods, dyes, or preservatives If you or your partner are pregnant or trying to get pregnant Breast-feeding How should I use this medication? This medication is injected into a vein. It is administered by your care team in a hospital or clinic setting. Talk to your care team about the use of this medication in children. Special care may be needed. Overdosage: If you think you have taken too much of this medicine contact a poison control center or emergency room at once. NOTE: This medicine is only for you. Do not share this medicine with others. What if I miss a dose? Keep appointments for follow-up doses. It is important not to miss your dose. Call your care team if you are unable to keep an appointment. What may interact with this medication? Do not take this medication with any of the following: Live virus vaccines This medication may also interact with the following: Medications that treat  or prevent blood clots, such as warfarin, enoxaparin, dalteparin This list may not describe all possible interactions. Give your health care provider a list of all the medicines, herbs, non-prescription drugs, or dietary supplements you use. Also tell them if you smoke, drink alcohol, or use illegal drugs. Some items may interact with your medicine. What should I watch for while using this medication? Your condition will be monitored carefully while you are receiving this medication. This medication may make you feel generally unwell. This is not uncommon as chemotherapy can affect healthy cells as well as cancer cells. Report any side effects. Continue your course of treatment even though you feel ill unless your care team tells you to stop. In some cases, you may be given additional medications to help with side effects. Follow all directions for their use. This medication may increase your risk of getting an infection. Call your care team for advice if you get a fever, chills, sore throat, or other symptoms of a cold or flu. Do not treat yourself. Try to avoid being around people who are sick. This medication may increase your risk to bruise or bleed. Call your care team if you notice any unusual bleeding. Be careful brushing or flossing your teeth or using a toothpick because you may get an infection or bleed more easily. If you have any dental work done, tell your dentist you are receiving this medication. Avoid taking medications that contain aspirin, acetaminophen, ibuprofen, naproxen, or ketoprofen unless instructed by your care team. These medications may hide a fever. Do not treat diarrhea with over the counter products. Contact your care team if you have diarrhea that lasts more than 2 days or if it is severe and watery. This medication can make you more sensitive to the sun. Keep out of the sun. If you cannot avoid being in the sun, wear protective clothing and sunscreen. Do not use sun lamps,  tanning beds, or tanning booths. Talk to your care team if you or your partner wish to become pregnant or think you might be pregnant. This medication can cause serious birth defects if taken during pregnancy and for 3 months after the last dose. A reliable form of contraception is recommended while taking this medication and for 3 months after the last dose. Talk to your care team about effective forms of contraception. Do not father a child while taking this medication and for 3 months after the last dose. Use a condom while having sex during this time period. Do not breastfeed while taking this medication. This medication  may cause infertility. Talk to your care team if you are concerned about your fertility. What side effects may I notice from receiving this medication? Side effects that you should report to your care team as soon as possible: Allergic reactions--skin rash, itching, hives, swelling of the face, lips, tongue, or throat Heart attack--pain or tightness in the chest, shoulders, arms, or jaw, nausea, shortness of breath, cold or clammy skin, feeling faint or lightheaded Heart failure--shortness of breath, swelling of the ankles, feet, or hands, sudden weight gain, unusual weakness or fatigue Heart rhythm changes--fast or irregular heartbeat, dizziness, feeling faint or lightheaded, chest pain, trouble breathing High ammonia level--unusual weakness or fatigue, confusion, loss of appetite, nausea, vomiting, seizures Infection--fever, chills, cough, sore throat, wounds that don't heal, pain or trouble when passing urine, general feeling of discomfort or being unwell Low red blood cell level--unusual weakness or fatigue, dizziness, headache, trouble breathing Pain, tingling, or numbness in the hands or feet, muscle weakness, change in vision, confusion or trouble speaking, loss of balance or coordination, trouble walking, seizures Redness, swelling, and blistering of the skin over hands and  feet Severe or prolonged diarrhea Unusual bruising or bleeding Side effects that usually do not require medical attention (report to your care team if they continue or are bothersome): Dry skin Headache Increased tears Nausea Pain, redness, or swelling with sores inside the mouth or throat Sensitivity to light Vomiting This list may not describe all possible side effects. Call your doctor for medical advice about side effects. You may report side effects to FDA at 1-800-FDA-1088. Where should I keep my medication? This medication is given in a hospital or clinic. It will not be stored at home. NOTE: This sheet is a summary. It may not cover all possible information. If you have questions about this medicine, talk to your doctor, pharmacist, or health care provider.  2024 Elsevier/Gold Standard (2021-05-29 00:00:00)

## 2022-07-02 NOTE — Progress Notes (Signed)
Patient seen by Dr. Truett Perna today  Vitals are within treatment parameters.  Labs reviewed by Dr. Truett Perna and are not all within treatment parameters. Platelet count 85,000  Per physician team, patient is ready for treatment. Please note that modifications are being made to the treatment plan including MD has dose reduced all drugs due to hand-foot and low platelets.

## 2022-07-02 NOTE — Progress Notes (Signed)
[facility] OFFICE PROGRESS NOTE   Diagnosis: Esophagus cancer  INTERVAL HISTORY:   [name] completed cycle 2 FOLFOX on 06/18/2022.  No nausea/vomiting, mouth sores, or diarrhea.  Stable dysphagia.  She developed hyperpigmentation and erythema of the hands and feet.  She has peeling of the right hand.  Chronic callus formation at the soles.  Objective:  Vital signs in last 24 hours:  Blood pressure (!) 103/58, pulse 94, temperature 98.1 F (36.7 C), temperature source Oral, resp. rate 20, height 5\' 5"  (1.651 m), weight 136 lb 12.8 oz (62.1 kg), SpO2 98 %.    HEENT: No thrush or ulcers Resp: Lungs clear bilaterally with distant breath sounds Cardio: Regular rate and rhythm GI: No hepatomegaly Vascular: No leg edema  Skin: Dryness with superficial dry desquamation at the right greater than left hand, mild erythema and callus formation at the soles  Portacath/PICC-without erythema  Lab Results:  Lab Results  Component Value Date   WBC 7.6 07/02/2022   HGB 12.7 07/02/2022   HCT 36.1 07/02/2022   MCV 94.8 07/02/2022   PLT 85 (L) 07/02/2022   NEUTROABS 4.3 07/02/2022    CMP  Lab Results  Component Value Date   NA 135 07/02/2022   K 3.9 07/02/2022   CL 101 07/02/2022   CO2 26 07/02/2022   GLUCOSE 102 (H) 07/02/2022   BUN 9 07/02/2022   CREATININE 1.03 (H) 07/02/2022   CALCIUM 9.6 07/02/2022   PROT 6.3 (L) 07/02/2022   ALBUMIN 3.9 07/02/2022   AST 24 07/02/2022   ALT 21 07/02/2022   ALKPHOS 82 07/02/2022   BILITOT 0.5 07/02/2022   GFRNONAA 57 (L) 07/02/2022    Lab Results  Component Value Date   CEA 13.63 (H) 07/02/2022    Medications: I have reviewed the patient's current medications.   Assessment/Plan: Distal esophagus cancer Esophagram 11/21/2020-moderate to severe esophageal dysmotility, narrowing of the distal esophagus just above the GE junction to 4 mm, 13 mm barium tablet stuck in the distal esophagus Upper endoscopy  12/13/2020-severe esophagitis with nodularity and stricturing at 36 cm-adenocarcinoma, HER2 negative, no loss of mismatch repair protein expression, PD-L1 combined positive score less than 1% CT chest 12/14/2020-accentuated density at the distal esophageal lumen above a small type I hiatal hernia concerning for malignancy, nonpathologically enlarged AP window and right hilar nodes PET 12/21/2020-abnormal FDG uptake at the distal esophagus, no evidence of nodal or distant metastatic disease Radiation 01/08/2021-02/16/2021 Cycle 1 weekly Taxol/carboplatin 01/08/2021 Cycle 2 weekly Taxol/carboplatin 01/15/2021 Treatment held 01/22/2021 due to neutropenia Positive COVID test 02/02/2021 Taxol/carboplatin 02/15/2021 Endoscopy 04/30/2021-shallow ulceration at the distal esophagus, nodularity at the gastric cardia-biopsies negative for malignancy CTs 04/02/2022-small mediastinal and right hilar lymph nodes.  Esophagus is mildly patulous decreased from previous in the mid to upper aspect of the mediastinum.  More distally the wall thickening of the esophagus appears slightly decreased.  Small hiatal hernia.  The area of thickening does correspond to the abnormality on PET-CT.  Emphysema. Endoscopy 04/15/2022-Short segment salmon-colored mucosa 1 cm above the Z-line.  Scarring at the gastroesophageal junction/Z-line with nonobstructing narrowing.  Nodular mucosa in the gastric cardia immediately distal to the Z-line.  Pathology on GE junction nodule-invasive moderately differentiated adenocarcinoma, tumor arises within a background of high-grade dysplasia, mild chronic active gastritis with foveolar hyperplasia, negative for squamous epithelium and intestinal metaplasia.  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  Negative for HER2 by IHC, PD-L1 CPS 2% PET scan 04/25/2022-increased radiotracer uptake associated with the distal  esophagus at the level of the GE junction.  No evidence of nodal  metastasis or distant metastatic disease. Cycle 1 FOLFOX 06/04/2022 Cycle 2 FOLFOX 06/18/2022, oxaliplatin dose reduced secondary thrombocytopenia Cycle 3 FOLFOX 06/24/2022, oxaliplatin dose reduced secondary thrombocytopenia, 5-FU dose reduced secondary to hand/foot syndrome Solid dysphagia secondary #1, resolved COPD on chest CT 12/14/2020 Hemorrhoids Hyperlipidemia Irritable bowel syndrome Hypothyroidism Tobacco use Thrombocytopenia secondary to chemotherapy Hand/foot syndrome-5-FU dose reduced with cycle 3    Disposition: Alison Garner has completed 2 cycles of FOLFOX.  She has tolerated the chemotherapy well, but she developed mild hand/foot syndrome following cycle 2. She will complete cycle 3 chemotherapy today.  The 5-FU bolus, leucovorin, and 5-FU infusion will be dose reduced. She has progressive thrombocytopenia.  Oxaliplatin will be further dose reduced with chemotherapy today.  [name] will return for an office visit and chemotherapy in 2 weeks. Thornton Papas, MD  07/02/2022  9:57 AM

## 2022-07-03 ENCOUNTER — Other Ambulatory Visit (HOSPITAL_BASED_OUTPATIENT_CLINIC_OR_DEPARTMENT_OTHER): Payer: Self-pay

## 2022-07-04 ENCOUNTER — Inpatient Hospital Stay: Payer: Medicare Other

## 2022-07-04 VITALS — BP 124/54 | HR 86 | Temp 97.4°F | Resp 18

## 2022-07-04 DIAGNOSIS — Z5111 Encounter for antineoplastic chemotherapy: Secondary | ICD-10-CM | POA: Diagnosis not present

## 2022-07-04 DIAGNOSIS — C155 Malignant neoplasm of lower third of esophagus: Secondary | ICD-10-CM

## 2022-07-04 MED ORDER — HEPARIN SOD (PORK) LOCK FLUSH 100 UNIT/ML IV SOLN
500.0000 [IU] | Freq: Once | INTRAVENOUS | Status: AC | PRN
  Administered 2022-07-04: 500 [IU]

## 2022-07-04 MED ORDER — PEGFILGRASTIM-CBQV 6 MG/0.6ML ~~LOC~~ SOSY
6.0000 mg | PREFILLED_SYRINGE | Freq: Once | SUBCUTANEOUS | Status: AC
  Administered 2022-07-04: 6 mg via SUBCUTANEOUS
  Filled 2022-07-04: qty 0.6

## 2022-07-04 MED ORDER — SODIUM CHLORIDE 0.9% FLUSH
10.0000 mL | INTRAVENOUS | Status: DC | PRN
  Administered 2022-07-04: 10 mL

## 2022-07-04 NOTE — Patient Instructions (Signed)

## 2022-07-08 ENCOUNTER — Telehealth: Payer: Self-pay | Admitting: *Deleted

## 2022-07-08 NOTE — Telephone Encounter (Signed)
Reports nosebleed last night lasting 20 minutes w/pressure applied. Awakened her from sleep. Reports she has never had nosebleed in past-just faint traces of blood when she blows nose. Last treatment 5/28 (FOLFOX) and she did take the Claritin x 5 days afterwards. She has not been using any saline nasal spray.

## 2022-07-08 NOTE — Telephone Encounter (Signed)
Faxed order for stat CBC/diff to [facility] lab [phone]. Alison Garner will go to lab tomorrow am for CBC check per Dr. Truett Perna. Also instructed her to use saline nasal spray to nostrils and small amount of petroleum jelly to keep nasal passages moist.

## 2022-07-09 ENCOUNTER — Telehealth: Payer: Self-pay | Admitting: *Deleted

## 2022-07-09 NOTE — Telephone Encounter (Addendum)
Notified by lab at [facility] that patient's platelet count returned at 28,000 today. Hgb 12.7. Full report being faxed. Dr. Truett Perna made aware and ordered repeat CBC/diff on 6/6 or 6/7 and to call for bleeding.  Ms. Mehus made aware and she will go on 07/11/22. Explained to call for bleeding and reviewed bleeding precautions. Order faxed to [phone]

## 2022-07-11 ENCOUNTER — Telehealth: Payer: Self-pay | Admitting: *Deleted

## 2022-07-11 NOTE — Telephone Encounter (Signed)
Alison Garner called to f/u on her platelet count drawn earlier today at [facility]. Informed her count was improved at 46,000. MD has reviewed and said to f/u as scheduled on 07/16/22.

## 2022-07-14 ENCOUNTER — Other Ambulatory Visit: Payer: Self-pay | Admitting: Oncology

## 2022-07-14 DIAGNOSIS — C155 Malignant neoplasm of lower third of esophagus: Secondary | ICD-10-CM

## 2022-07-16 ENCOUNTER — Inpatient Hospital Stay (HOSPITAL_BASED_OUTPATIENT_CLINIC_OR_DEPARTMENT_OTHER): Payer: Medicare Other | Admitting: Nurse Practitioner

## 2022-07-16 ENCOUNTER — Inpatient Hospital Stay: Payer: Medicare Other

## 2022-07-16 ENCOUNTER — Encounter: Payer: Self-pay | Admitting: Nurse Practitioner

## 2022-07-16 ENCOUNTER — Inpatient Hospital Stay: Payer: Medicare Other | Attending: Oncology

## 2022-07-16 VITALS — BP 114/82 | HR 79 | Temp 98.1°F | Resp 18 | Ht 65.0 in | Wt 136.1 lb

## 2022-07-16 DIAGNOSIS — E785 Hyperlipidemia, unspecified: Secondary | ICD-10-CM | POA: Diagnosis not present

## 2022-07-16 DIAGNOSIS — K589 Irritable bowel syndrome without diarrhea: Secondary | ICD-10-CM | POA: Diagnosis not present

## 2022-07-16 DIAGNOSIS — D696 Thrombocytopenia, unspecified: Secondary | ICD-10-CM | POA: Insufficient documentation

## 2022-07-16 DIAGNOSIS — C155 Malignant neoplasm of lower third of esophagus: Secondary | ICD-10-CM | POA: Diagnosis present

## 2022-07-16 DIAGNOSIS — D6959 Other secondary thrombocytopenia: Secondary | ICD-10-CM | POA: Insufficient documentation

## 2022-07-16 DIAGNOSIS — K649 Unspecified hemorrhoids: Secondary | ICD-10-CM | POA: Diagnosis not present

## 2022-07-16 DIAGNOSIS — L271 Localized skin eruption due to drugs and medicaments taken internally: Secondary | ICD-10-CM | POA: Insufficient documentation

## 2022-07-16 DIAGNOSIS — J449 Chronic obstructive pulmonary disease, unspecified: Secondary | ICD-10-CM | POA: Diagnosis not present

## 2022-07-16 DIAGNOSIS — Z5111 Encounter for antineoplastic chemotherapy: Secondary | ICD-10-CM | POA: Diagnosis present

## 2022-07-16 DIAGNOSIS — Z5189 Encounter for other specified aftercare: Secondary | ICD-10-CM | POA: Diagnosis not present

## 2022-07-16 DIAGNOSIS — E039 Hypothyroidism, unspecified: Secondary | ICD-10-CM | POA: Insufficient documentation

## 2022-07-16 DIAGNOSIS — R197 Diarrhea, unspecified: Secondary | ICD-10-CM | POA: Insufficient documentation

## 2022-07-16 LAB — CMP (CANCER CENTER ONLY)
ALT: 27 U/L (ref 0–44)
AST: 25 U/L (ref 15–41)
Albumin: 3.9 g/dL (ref 3.5–5.0)
Alkaline Phosphatase: 116 U/L (ref 38–126)
Anion gap: 8 (ref 5–15)
BUN: 6 mg/dL — ABNORMAL LOW (ref 8–23)
CO2: 26 mmol/L (ref 22–32)
Calcium: 9.4 mg/dL (ref 8.9–10.3)
Chloride: 98 mmol/L (ref 98–111)
Creatinine: 1.1 mg/dL — ABNORMAL HIGH (ref 0.44–1.00)
GFR, Estimated: 53 mL/min — ABNORMAL LOW (ref >60)
Glucose, Bld: 150 mg/dL — ABNORMAL HIGH (ref 70–99)
Potassium: 3.8 mmol/L (ref 3.5–5.1)
Sodium: 132 mmol/L — ABNORMAL LOW (ref 135–145)
Total Bilirubin: 0.6 mg/dL (ref 0.3–1.2)
Total Protein: 6.1 g/dL — ABNORMAL LOW (ref 6.5–8.1)

## 2022-07-16 LAB — CBC WITH DIFFERENTIAL (CANCER CENTER ONLY)
Abs Immature Granulocytes: 0.3 10*3/uL — ABNORMAL HIGH (ref 0.00–0.07)
Band Neutrophils: 4 %
Basophils Absolute: 0 10*3/uL (ref 0.0–0.1)
Basophils Relative: 0 %
Eosinophils Absolute: 0 10*3/uL (ref 0.0–0.5)
Eosinophils Relative: 0 %
HCT: 35 % — ABNORMAL LOW (ref 36.0–46.0)
Hemoglobin: 12.4 g/dL (ref 12.0–15.0)
Lymphocytes Relative: 12 %
Lymphs Abs: 1.5 10*3/uL (ref 0.7–4.0)
MCH: 34.2 pg — ABNORMAL HIGH (ref 26.0–34.0)
MCHC: 35.4 g/dL (ref 30.0–36.0)
MCV: 96.4 fL (ref 80.0–100.0)
Metamyelocytes Relative: 1 %
Monocytes Absolute: 0.6 10*3/uL (ref 0.1–1.0)
Monocytes Relative: 5 %
Myelocytes: 1 %
Neutro Abs: 10.1 10*3/uL — ABNORMAL HIGH (ref 1.7–7.7)
Neutrophils Relative %: 77 %
Platelet Count: 74 10*3/uL — ABNORMAL LOW (ref 150–400)
RBC: 3.63 MIL/uL — ABNORMAL LOW (ref 3.87–5.11)
RDW: 19.2 % — ABNORMAL HIGH (ref 11.5–15.5)
WBC Count: 12.5 10*3/uL — ABNORMAL HIGH (ref 4.0–10.5)
nRBC: 0.8 % — ABNORMAL HIGH (ref 0.0–0.2)

## 2022-07-16 LAB — CEA (ACCESS): CEA (CHCC): 15.53 ng/mL — ABNORMAL HIGH (ref 0.00–5.00)

## 2022-07-16 MED ORDER — SODIUM CHLORIDE 0.9% FLUSH
10.0000 mL | INTRAVENOUS | Status: AC | PRN
  Administered 2022-07-16: 10 mL

## 2022-07-16 MED ORDER — HEPARIN SOD (PORK) LOCK FLUSH 100 UNIT/ML IV SOLN
500.0000 [IU] | INTRAVENOUS | Status: AC | PRN
  Administered 2022-07-16: 500 [IU]

## 2022-07-16 MED ORDER — SODIUM CHLORIDE 0.9 % IV SOLN: INTRAVENOUS | Status: AC

## 2022-07-16 NOTE — Progress Notes (Signed)
Patient seen by [name] NP today  Vitals are within treatment parameters.  Labs reviewed by [name] NP and are not all within treatment parameters. PLT 74  Per physician team, patient will not be receiving treatment today.  Patient will be received IVF.

## 2022-07-16 NOTE — Patient Instructions (Signed)

## 2022-07-16 NOTE — Progress Notes (Signed)
[facility] OFFICE PROGRESS NOTE   Diagnosis: Esophagus cancer  INTERVAL HISTORY:   Alison Garner returns as scheduled.  She completed cycle 3 FOLFOX 07/02/2022.  Oxaliplatin was dose reduced due to thrombocytopenia.  5-FU dose reduced due to hand-foot syndrome.  She denies nausea/vomiting.  No mouth sores.  Baseline bowel habits typically intermittent loose stools.  She has had loose watery bowel movements since the last chemotherapy.  She estimates 2 to 3/day.  She describes fluid intake as "normal".  She thinks she is drinking 32 ounces per day, definitely "not 64".  She is lightheaded with position change.  She had a single nosebleed following the last chemotherapy.  She continues to have blood with nose blowing.  She developed tenderness on the left thumb tip.  She is more fatigued.  Objective:  Vital signs in last 24 hours:  Blood pressure 120/70, pulse 98, temperature 98.1 F (36.7 C), temperature source Oral, resp. rate 18, height 5\' 5"  (1.651 m), weight 136 lb 1.6 oz (61.7 kg), SpO2 99 %.    HEENT: No thrush or ulcers. Resp: Distant breath sounds.  No respiratory distress. Cardio: Regular rate and rhythm. GI: No hepatomegaly. Vascular: No leg edema. Neuro: Alert and oriented. Skin: Skin has a dry appearance in general.  Mild peeling left thumb tip.  Mild decrease in skin turgor. Portacath without erythema.  Lab Results:  Lab Results  Component Value Date   WBC 12.5 (H) 07/16/2022   HGB 12.4 07/16/2022   HCT 35.0 (L) 07/16/2022   MCV 96.4 07/16/2022   PLT 74 (L) 07/16/2022   NEUTROABS PENDING 07/16/2022    Imaging:  No results found.  Medications: I have reviewed the patient's current medications.  Assessment/Plan: Distal esophagus cancer Esophagram 11/21/2020-moderate to severe esophageal dysmotility, narrowing of the distal esophagus just above the GE junction to 4 mm, 13 mm barium tablet stuck in the distal esophagus Upper endoscopy  12/13/2020-severe esophagitis with nodularity and stricturing at 36 cm-adenocarcinoma, HER2 negative, no loss of mismatch repair protein expression, PD-L1 combined positive score less than 1% CT chest 12/14/2020-accentuated density at the distal esophageal lumen above a small type I hiatal hernia concerning for malignancy, nonpathologically enlarged AP window and right hilar nodes PET 12/21/2020-abnormal FDG uptake at the distal esophagus, no evidence of nodal or distant metastatic disease Radiation 01/08/2021-02/16/2021 Cycle 1 weekly Taxol/carboplatin 01/08/2021 Cycle 2 weekly Taxol/carboplatin 01/15/2021 Treatment held 01/22/2021 due to neutropenia Positive COVID test 02/02/2021 Taxol/carboplatin 02/15/2021 Endoscopy 04/30/2021-shallow ulceration at the distal esophagus, nodularity at the gastric cardia-biopsies negative for malignancy CTs 04/02/2022-small mediastinal and right hilar lymph nodes.  Esophagus is mildly patulous decreased from previous in the mid to upper aspect of the mediastinum.  More distally the wall thickening of the esophagus appears slightly decreased.  Small hiatal hernia.  The area of thickening does correspond to the abnormality on PET-CT.  Emphysema. Endoscopy 04/15/2022-Short segment salmon-colored mucosa 1 cm above the Z-line.  Scarring at the gastroesophageal junction/Z-line with nonobstructing narrowing.  Nodular mucosa in the gastric cardia immediately distal to the Z-line.  Pathology on GE junction nodule-invasive moderately differentiated adenocarcinoma, tumor arises within a background of high-grade dysplasia, mild chronic active gastritis with foveolar hyperplasia, negative for squamous epithelium and intestinal metaplasia.  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  Negative for HER2 by IHC, PD-L1 CPS 2% PET scan 04/25/2022-increased radiotracer uptake associated with the distal esophagus at the level of the GE junction.  No evidence of nodal  metastasis or distant metastatic disease.  Cycle 1 FOLFOX 06/04/2022 Cycle 2 FOLFOX 06/18/2022, oxaliplatin dose reduced secondary thrombocytopenia Cycle 3 FOLFOX 06/24/2022, oxaliplatin dose reduced secondary thrombocytopenia, 5-FU dose reduced secondary to hand/foot syndrome 07/16/2022 chemotherapy held due to thrombocytopenia, dehydration Solid dysphagia secondary #1, resolved COPD on chest CT 12/14/2020 Hemorrhoids Hyperlipidemia Irritable bowel syndrome Hypothyroidism Tobacco use Thrombocytopenia secondary to chemotherapy Hand/foot syndrome-5-FU dose reduced with cycle 3    Disposition: Alison Garner appears stable.  She has completed 3 cycles of FOLFOX.  Oxaliplatin and 5-FU were dose reduced with cycle 3.  Platelet count declined into the 20,000 range.  Platelet count today is 74,000.  She is having more diarrhea.  She is likely mildly dehydrated today.  We decided to hold today's treatment due to diarrhea/dehydration and thrombocytopenia.  She will receive IV fluids.  She will return for lab, follow-up, possible chemotherapy in 1 week.  We are available to see her sooner if needed.    [name] ANP/GNP-BC   07/16/2022  9:52 AM

## 2022-07-18 ENCOUNTER — Inpatient Hospital Stay: Payer: Medicare Other

## 2022-07-23 ENCOUNTER — Encounter: Payer: Self-pay | Admitting: Nurse Practitioner

## 2022-07-23 ENCOUNTER — Inpatient Hospital Stay: Payer: Medicare Other

## 2022-07-23 ENCOUNTER — Inpatient Hospital Stay (HOSPITAL_BASED_OUTPATIENT_CLINIC_OR_DEPARTMENT_OTHER): Payer: Medicare Other | Admitting: Nurse Practitioner

## 2022-07-23 ENCOUNTER — Encounter: Payer: Self-pay | Admitting: *Deleted

## 2022-07-23 VITALS — BP 142/60

## 2022-07-23 VITALS — BP 124/82 | HR 86 | Temp 98.1°F | Resp 18 | Ht 65.0 in | Wt 136.5 lb

## 2022-07-23 DIAGNOSIS — C155 Malignant neoplasm of lower third of esophagus: Secondary | ICD-10-CM

## 2022-07-23 DIAGNOSIS — Z5111 Encounter for antineoplastic chemotherapy: Secondary | ICD-10-CM | POA: Diagnosis not present

## 2022-07-23 LAB — CBC WITH DIFFERENTIAL (CANCER CENTER ONLY)
Abs Immature Granulocytes: 0.09 10*3/uL — ABNORMAL HIGH (ref 0.00–0.07)
Basophils Absolute: 0.1 10*3/uL (ref 0.0–0.1)
Basophils Relative: 1 %
Eosinophils Absolute: 0 10*3/uL (ref 0.0–0.5)
Eosinophils Relative: 1 %
HCT: 36.3 % (ref 36.0–46.0)
Hemoglobin: 12.6 g/dL (ref 12.0–15.0)
Immature Granulocytes: 1 %
Lymphocytes Relative: 18 %
Lymphs Abs: 1.1 10*3/uL (ref 0.7–4.0)
MCH: 34.4 pg — ABNORMAL HIGH (ref 26.0–34.0)
MCHC: 34.7 g/dL (ref 30.0–36.0)
MCV: 99.2 fL (ref 80.0–100.0)
Monocytes Absolute: 0.7 10*3/uL (ref 0.1–1.0)
Monocytes Relative: 12 %
Neutro Abs: 4.4 10*3/uL (ref 1.7–7.7)
Neutrophils Relative %: 67 %
Platelet Count: 150 10*3/uL (ref 150–400)
RBC: 3.66 MIL/uL — ABNORMAL LOW (ref 3.87–5.11)
RDW: 20.5 % — ABNORMAL HIGH (ref 11.5–15.5)
WBC Count: 6.4 10*3/uL (ref 4.0–10.5)
nRBC: 0 % (ref 0.0–0.2)

## 2022-07-23 LAB — CMP (CANCER CENTER ONLY)
ALT: 16 U/L (ref 0–44)
AST: 18 U/L (ref 15–41)
Albumin: 3.9 g/dL (ref 3.5–5.0)
Alkaline Phosphatase: 103 U/L (ref 38–126)
Anion gap: 8 (ref 5–15)
BUN: 6 mg/dL — ABNORMAL LOW (ref 8–23)
CO2: 26 mmol/L (ref 22–32)
Calcium: 9.5 mg/dL (ref 8.9–10.3)
Chloride: 99 mmol/L (ref 98–111)
Creatinine: 1.02 mg/dL — ABNORMAL HIGH (ref 0.44–1.00)
GFR, Estimated: 58 mL/min — ABNORMAL LOW (ref >60)
Glucose, Bld: 134 mg/dL — ABNORMAL HIGH (ref 70–99)
Potassium: 4 mmol/L (ref 3.5–5.1)
Sodium: 133 mmol/L — ABNORMAL LOW (ref 135–145)
Total Bilirubin: 0.7 mg/dL (ref 0.3–1.2)
Total Protein: 6.5 g/dL (ref 6.5–8.1)

## 2022-07-23 LAB — CEA (ACCESS): CEA (CHCC): 15.64 ng/mL — ABNORMAL HIGH (ref 0.00–5.00)

## 2022-07-23 MED ORDER — SODIUM CHLORIDE 0.9 % IV SOLN
10.0000 mg | Freq: Once | INTRAVENOUS | Status: AC
  Administered 2022-07-23: 10 mg via INTRAVENOUS
  Filled 2022-07-23: qty 10

## 2022-07-23 MED ORDER — SODIUM CHLORIDE 0.9 % IV SOLN
1800.0000 mg/m2 | INTRAVENOUS | Status: DC
  Administered 2022-07-23: 3100 mg via INTRAVENOUS
  Filled 2022-07-23: qty 62

## 2022-07-23 MED ORDER — LEUCOVORIN CALCIUM INJECTION 350 MG
300.0000 mg/m2 | Freq: Once | INTRAVENOUS | Status: AC
  Administered 2022-07-23: 516 mg via INTRAVENOUS
  Filled 2022-07-23: qty 25.8

## 2022-07-23 MED ORDER — PALONOSETRON HCL INJECTION 0.25 MG/5ML
0.2500 mg | Freq: Once | INTRAVENOUS | Status: AC
  Administered 2022-07-23: 0.25 mg via INTRAVENOUS
  Filled 2022-07-23: qty 5

## 2022-07-23 MED ORDER — FLUOROURACIL CHEMO INJECTION 500 MG/10ML
300.0000 mg/m2 | Freq: Once | INTRAVENOUS | Status: AC
  Administered 2022-07-23: 500 mg via INTRAVENOUS
  Filled 2022-07-23: qty 10

## 2022-07-23 MED ORDER — DEXTROSE 5 % IV SOLN: Freq: Once | INTRAVENOUS | Status: AC

## 2022-07-23 MED ORDER — OXALIPLATIN CHEMO INJECTION 100 MG/20ML
35.0000 mg/m2 | Freq: Once | INTRAVENOUS | Status: AC
  Administered 2022-07-23: 60 mg via INTRAVENOUS
  Filled 2022-07-23: qty 12

## 2022-07-23 NOTE — Patient Instructions (Signed)

## 2022-07-23 NOTE — Addendum Note (Signed)
Addended by: Rana Snare on: 07/23/2022 03:33 PM   Modules accepted: Orders

## 2022-07-23 NOTE — Progress Notes (Signed)
Patient seen by [name] NP today  Vitals are within treatment parameters.  Labs reviewed by [name] NP and are within treatment parameters.  Per physician team, patient is ready for treatment. Please note that modifications are being made to the treatment plan including Dose reduction on oxaliplatin again today

## 2022-07-23 NOTE — Patient Instructions (Addendum)
[facility]   The chemotherapy medication bag should finish at 46 hours, 96 hours, or 7 days. For example, if your pump is scheduled for 46 hours and it was put on at 4:00 p.m., it should finish at 2:00 p.m. the day it is scheduled to come off regardless of your appointment time.     Estimated time to finish at 12pm Thursday, June 20th, 2024.   If the display on your pump reads "Low Volume" and it is beeping, take the batteries out of the pump and come to the cancer center for it to be taken off.   If the pump alarms go off prior to the pump reading "Low Volume" then call [phone] and someone can assist you.  If the plunger comes out and the chemotherapy medication is leaking out, please use your home chemo spill kit to clean up the spill. Do NOT use paper towels or other household products.  If you have problems or questions regarding your pump, please call either [phone] (24 hours a day) or the cancer center Monday-Friday 8:00 a.m.- 4:30 p.m. at the clinic number and we will assist you. If you are unable to get assistance, then go to the nearest Emergency Department and ask the staff to contact the IV team for assistance.  Discharge Instructions: Thank you for choosing [facility] to provide your oncology and hematology care.   If you have a lab appointment with the Cancer Center, please go directly to the Cancer Center and check in at the registration area.   Wear comfortable clothing and clothing appropriate for easy access to any Portacath or PICC line.   We strive to give you quality time with your provider. You may need to reschedule your appointment if you arrive late (15 or more minutes).  Arriving late affects you and other patients whose appointments are after yours.  Also, if you miss three or more appointments without notifying the office, you may be dismissed from the clinic at the provider's discretion.      For  prescription refill requests, have your pharmacy contact our office and allow 72 hours for refills to be completed.    Today you received the following chemotherapy and/or immunotherapy agents Oxaliplatin, Leucovorin, Fluorouracil      To help prevent nausea and vomiting after your treatment, we encourage you to take your nausea medication as directed.  BELOW ARE SYMPTOMS THAT SHOULD BE REPORTED IMMEDIATELY: *FEVER GREATER THAN 100.4 F (38 C) OR HIGHER *CHILLS OR SWEATING *NAUSEA AND VOMITING THAT IS NOT CONTROLLED WITH YOUR NAUSEA MEDICATION *UNUSUAL SHORTNESS OF BREATH *UNUSUAL BRUISING OR BLEEDING *URINARY PROBLEMS (pain or burning when urinating, or frequent urination) *BOWEL PROBLEMS (unusual diarrhea, constipation, pain near the anus) TENDERNESS IN MOUTH AND THROAT WITH OR WITHOUT PRESENCE OF ULCERS (sore throat, sores in mouth, or a toothache) UNUSUAL RASH, SWELLING OR PAIN  UNUSUAL VAGINAL DISCHARGE OR ITCHING   Items with * indicate a potential emergency and should be followed up as soon as possible or go to the Emergency Department if any problems should occur.  Please show the CHEMOTHERAPY ALERT CARD or IMMUNOTHERAPY ALERT CARD at check-in to the Emergency Department and triage nurse.  Should you have questions after your visit or need to cancel or reschedule your appointment, please contact [facility]  Dept: [phone]  and follow the prompts.  Office hours are 8:00 a.m. to 4:30 p.m. Monday - Friday. Please note  that voicemails left after 4:00 p.m. may not be returned until the following business day.  We are closed weekends and major holidays. You have access to a nurse at all times for urgent questions. Please call the main number to the clinic Dept: [phone] and follow the prompts.   For any non-urgent questions, you may also contact your provider using MyChart. We now offer e-Visits for anyone 79 and older to request care online for  non-urgent symptoms. For details visit [email or web address].   Also download the MyChart app! Go to the app store, search "MyChart", open the app, select [facility], and log in with your MyChart username and password.  Oxaliplatin Injection What is this medication? OXALIPLATIN (ox AL i PLA tin) treats colorectal cancer. It works by slowing down the growth of cancer cells. This medicine may be used for other purposes; ask your health care provider or pharmacist if you have questions. COMMON BRAND NAME(S): Eloxatin What should I tell my care team before I take this medication? They need to know if you have any of these conditions: Heart disease History of irregular heartbeat or rhythm Liver disease Low blood cell levels (white cells, red cells, and platelets) Lung or breathing disease, such as asthma Take medications that treat or prevent blood clots Tingling of the fingers, toes, or other nerve disorder An unusual or allergic reaction to oxaliplatin, other medications, foods, dyes, or preservatives If you or your partner are pregnant or trying to get pregnant Breast-feeding How should I use this medication? This medication is injected into a vein. It is given by your care team in a hospital or clinic setting. Talk to your care team about the use of this medication in children. Special care may be needed. Overdosage: If you think you have taken too much of this medicine contact a poison control center or emergency room at once. NOTE: This medicine is only for you. Do not share this medicine with others. What if I miss a dose? Keep appointments for follow-up doses. It is important not to miss a dose. Call your care team if you are unable to keep an appointment. What may interact with this medication? Do not take this medication with any of the following: Cisapride Dronedarone Pimozide Thioridazine This medication may also interact with the following: Aspirin and aspirin-like  medications Certain medications that treat or prevent blood clots, such as warfarin, apixaban, dabigatran, and rivaroxaban Cisplatin Cyclosporine Diuretics Medications for infection, such as acyclovir, adefovir, amphotericin B, bacitracin, cidofovir, foscarnet, ganciclovir, gentamicin, pentamidine, vancomycin NSAIDs, medications for pain and inflammation, such as ibuprofen or naproxen Other medications that cause heart rhythm changes Pamidronate Zoledronic acid This list may not describe all possible interactions. Give your health care provider a list of all the medicines, herbs, non-prescription drugs, or dietary supplements you use. Also tell them if you smoke, drink alcohol, or use illegal drugs. Some items may interact with your medicine. What should I watch for while using this medication? Your condition will be monitored carefully while you are receiving this medication. You may need blood work while taking this medication. This medication may make you feel generally unwell. This is not uncommon as chemotherapy can affect healthy cells as well as cancer cells. Report any side effects. Continue your course of treatment even though you feel ill unless your care team tells you to stop. This medication may increase your risk of getting an infection. Call your care team for advice if you get a fever, chills, sore throat, or  other symptoms of a cold or flu. Do not treat yourself. Try to avoid being around people who are sick. Avoid taking medications that contain aspirin, acetaminophen, ibuprofen, naproxen, or ketoprofen unless instructed by your care team. These medications may hide a fever. Be careful brushing or flossing your teeth or using a toothpick because you may get an infection or bleed more easily. If you have any dental work done, tell your dentist you are receiving this medication. This medication can make you more sensitive to cold. Do not drink cold drinks or use ice. Cover exposed  skin before coming in contact with cold temperatures or cold objects. When out in cold weather wear warm clothing and cover your mouth and nose to warm the air that goes into your lungs. Tell your care team if you get sensitive to the cold. Talk to your care team if you or your partner are pregnant or think either of you might be pregnant. This medication can cause serious birth defects if taken during pregnancy and for 9 months after the last dose. A negative pregnancy test is required before starting this medication. A reliable form of contraception is recommended while taking this medication and for 9 months after the last dose. Talk to your care team about effective forms of contraception. Do not father a child while taking this medication and for 6 months after the last dose. Use a condom while having sex during this time period. Do not breastfeed while taking this medication and for 3 months after the last dose. This medication may cause infertility. Talk to your care team if you are concerned about your fertility. What side effects may I notice from receiving this medication? Side effects that you should report to your care team as soon as possible: Allergic reactions--skin rash, itching, hives, swelling of the face, lips, tongue, or throat Bleeding--bloody or black, tar-like stools, vomiting blood or brown material that looks like coffee grounds, red or dark brown urine, small red or purple spots on skin, unusual bruising or bleeding Dry cough, shortness of breath or trouble breathing Heart rhythm changes--fast or irregular heartbeat, dizziness, feeling faint or lightheaded, chest pain, trouble breathing Infection--fever, chills, cough, sore throat, wounds that don't heal, pain or trouble when passing urine, general feeling of discomfort or being unwell Liver injury--right upper belly pain, loss of appetite, nausea, light-colored stool, dark yellow or brown urine, yellowing skin or eyes, unusual  weakness or fatigue Low red blood cell level--unusual weakness or fatigue, dizziness, headache, trouble breathing Muscle injury--unusual weakness or fatigue, muscle pain, dark yellow or brown urine, decrease in amount of urine Pain, tingling, or numbness in the hands or feet Sudden and severe headache, confusion, change in vision, seizures, which may be signs of posterior reversible encephalopathy syndrome (PRES) Unusual bruising or bleeding Side effects that usually do not require medical attention (report to your care team if they continue or are bothersome): Diarrhea Nausea Pain, redness, or swelling with sores inside the mouth or throat Unusual weakness or fatigue Vomiting This list may not describe all possible side effects. Call your doctor for medical advice about side effects. You may report side effects to FDA at 1-800-FDA-1088. Where should I keep my medication? This medication is given in a hospital or clinic. It will not be stored at home. NOTE: This sheet is a summary. It may not cover all possible information. If you have questions about this medicine, talk to your doctor, pharmacist, or health care provider.  2024 Elsevier/Gold Standard (  2022-03-31 00:00:00)  Leucovorin Injection What is this medication? LEUCOVORIN (loo koe VOR in) prevents side effects from certain medications, such as methotrexate. It works by increasing folate levels. This helps protect healthy cells in your body. It may also be used to treat anemia caused by low levels of folate. It can also be used with fluorouracil, a type of chemotherapy, to treat colorectal cancer. It works by increasing the effects of fluorouracil in the body. This medicine may be used for other purposes; ask your health care provider or pharmacist if you have questions. What should I tell my care team before I take this medication? They need to know if you have any of these conditions: Anemia from low levels of vitamin B12 in the  blood An unusual or allergic reaction to leucovorin, folic acid, other medications, foods, dyes, or preservatives Pregnant or trying to get pregnant Breastfeeding How should I use this medication? This medication is injected into a vein or a muscle. It is given by your care team in a hospital or clinic setting. Talk to your care team about the use of this medication in children. Special care may be needed. Overdosage: If you think you have taken too much of this medicine contact a poison control center or emergency room at once. NOTE: This medicine is only for you. Do not share this medicine with others. What if I miss a dose? Keep appointments for follow-up doses. It is important not to miss your dose. Call your care team if you are unable to keep an appointment. What may interact with this medication? Capecitabine Fluorouracil Phenobarbital Phenytoin Primidone Trimethoprim;sulfamethoxazole This list may not describe all possible interactions. Give your health care provider a list of all the medicines, herbs, non-prescription drugs, or dietary supplements you use. Also tell them if you smoke, drink alcohol, or use illegal drugs. Some items may interact with your medicine. What should I watch for while using this medication? Your condition will be monitored carefully while you are receiving this medication. This medication may increase the side effects of 5-fluorouracil. Tell your care team if you have diarrhea or mouth sores that do not get better or that get worse. What side effects may I notice from receiving this medication? Side effects that you should report to your care team as soon as possible: Allergic reactions--skin rash, itching, hives, swelling of the face, lips, tongue, or throat This list may not describe all possible side effects. Call your doctor for medical advice about side effects. You may report side effects to FDA at 1-800-FDA-1088. Where should I keep my  medication? This medication is given in a hospital or clinic. It will not be stored at home. NOTE: This sheet is a summary. It may not cover all possible information. If you have questions about this medicine, talk to your doctor, pharmacist, or health care provider.  2024 Elsevier/Gold Standard (2021-06-26 00:00:00)  Fluorouracil Injection What is this medication? FLUOROURACIL (flure oh YOOR a sil) treats some types of cancer. It works by slowing down the growth of cancer cells. This medicine may be used for other purposes; ask your health care provider or pharmacist if you have questions. COMMON BRAND NAME(S): Adrucil What should I tell my care team before I take this medication? They need to know if you have any of these conditions: Blood disorders Dihydropyrimidine dehydrogenase (DPD) deficiency Infection, such as chickenpox, cold sores, herpes Kidney disease Liver disease Poor nutrition Recent or ongoing radiation therapy An unusual or allergic reaction to  fluorouracil, other medications, foods, dyes, or preservatives If you or your partner are pregnant or trying to get pregnant Breast-feeding How should I use this medication? This medication is injected into a vein. It is administered by your care team in a hospital or clinic setting. Talk to your care team about the use of this medication in children. Special care may be needed. Overdosage: If you think you have taken too much of this medicine contact a poison control center or emergency room at once. NOTE: This medicine is only for you. Do not share this medicine with others. What if I miss a dose? Keep appointments for follow-up doses. It is important not to miss your dose. Call your care team if you are unable to keep an appointment. What may interact with this medication? Do not take this medication with any of the following: Live virus vaccines This medication may also interact with the following: Medications that treat  or prevent blood clots, such as warfarin, enoxaparin, dalteparin This list may not describe all possible interactions. Give your health care provider a list of all the medicines, herbs, non-prescription drugs, or dietary supplements you use. Also tell them if you smoke, drink alcohol, or use illegal drugs. Some items may interact with your medicine. What should I watch for while using this medication? Your condition will be monitored carefully while you are receiving this medication. This medication may make you feel generally unwell. This is not uncommon as chemotherapy can affect healthy cells as well as cancer cells. Report any side effects. Continue your course of treatment even though you feel ill unless your care team tells you to stop. In some cases, you may be given additional medications to help with side effects. Follow all directions for their use. This medication may increase your risk of getting an infection. Call your care team for advice if you get a fever, chills, sore throat, or other symptoms of a cold or flu. Do not treat yourself. Try to avoid being around people who are sick. This medication may increase your risk to bruise or bleed. Call your care team if you notice any unusual bleeding. Be careful brushing or flossing your teeth or using a toothpick because you may get an infection or bleed more easily. If you have any dental work done, tell your dentist you are receiving this medication. Avoid taking medications that contain aspirin, acetaminophen, ibuprofen, naproxen, or ketoprofen unless instructed by your care team. These medications may hide a fever. Do not treat diarrhea with over the counter products. Contact your care team if you have diarrhea that lasts more than 2 days or if it is severe and watery. This medication can make you more sensitive to the sun. Keep out of the sun. If you cannot avoid being in the sun, wear protective clothing and sunscreen. Do not use sun lamps,  tanning beds, or tanning booths. Talk to your care team if you or your partner wish to become pregnant or think you might be pregnant. This medication can cause serious birth defects if taken during pregnancy and for 3 months after the last dose. A reliable form of contraception is recommended while taking this medication and for 3 months after the last dose. Talk to your care team about effective forms of contraception. Do not father a child while taking this medication and for 3 months after the last dose. Use a condom while having sex during this time period. Do not breastfeed while taking this medication. This medication  may cause infertility. Talk to your care team if you are concerned about your fertility. What side effects may I notice from receiving this medication? Side effects that you should report to your care team as soon as possible: Allergic reactions--skin rash, itching, hives, swelling of the face, lips, tongue, or throat Heart attack--pain or tightness in the chest, shoulders, arms, or jaw, nausea, shortness of breath, cold or clammy skin, feeling faint or lightheaded Heart failure--shortness of breath, swelling of the ankles, feet, or hands, sudden weight gain, unusual weakness or fatigue Heart rhythm changes--fast or irregular heartbeat, dizziness, feeling faint or lightheaded, chest pain, trouble breathing High ammonia level--unusual weakness or fatigue, confusion, loss of appetite, nausea, vomiting, seizures Infection--fever, chills, cough, sore throat, wounds that don't heal, pain or trouble when passing urine, general feeling of discomfort or being unwell Low red blood cell level--unusual weakness or fatigue, dizziness, headache, trouble breathing Pain, tingling, or numbness in the hands or feet, muscle weakness, change in vision, confusion or trouble speaking, loss of balance or coordination, trouble walking, seizures Redness, swelling, and blistering of the skin over hands and  feet Severe or prolonged diarrhea Unusual bruising or bleeding Side effects that usually do not require medical attention (report to your care team if they continue or are bothersome): Dry skin Headache Increased tears Nausea Pain, redness, or swelling with sores inside the mouth or throat Sensitivity to light Vomiting This list may not describe all possible side effects. Call your doctor for medical advice about side effects. You may report side effects to FDA at 1-800-FDA-1088. Where should I keep my medication? This medication is given in a hospital or clinic. It will not be stored at home. NOTE: This sheet is a summary. It may not cover all possible information. If you have questions about this medicine, talk to your doctor, pharmacist, or health care provider.  2024 Elsevier/Gold Standard (2021-05-29 00:00:00)

## 2022-07-23 NOTE — Progress Notes (Signed)
[facility] OFFICE PROGRESS NOTE   Diagnosis: Esophagus cancer  INTERVAL HISTORY:   Alison Garner returns as scheduled.  She completed cycle 3 FOLFOX 07/02/2022.  Cycle 4 was held on 07/16/2022 due to thrombocytopenia and dehydration.  She felt better after the IV fluids.  She denies nausea/vomiting.  No mouth sores.  No change in baseline bowel habits.  Skin over hands and feet continues to improve.  Objective:  Vital signs in last 24 hours:  Blood pressure 124/82, pulse 86, temperature 98.1 F (36.7 C), temperature source Oral, resp. rate 18, height 5\' 5"  (1.651 m), weight 136 lb 8 oz (61.9 kg), SpO2 100 %.    HEENT: No thrush or ulcers. Resp: Distant breath sounds.  No respiratory distress. Cardio: Regular rate and rhythm. GI: No hepatosplenomegaly. Vascular: No leg edema. Skin: Palms and soles have a dry appearance. Port-A-Cath without erythema.  Lab Results:  Lab Results  Component Value Date   WBC 6.4 07/23/2022   HGB 12.6 07/23/2022   HCT 36.3 07/23/2022   MCV 99.2 07/23/2022   PLT 150 07/23/2022   NEUTROABS 4.4 07/23/2022    Imaging:  No results found.  Medications: I have reviewed the patient's current medications.  Assessment/Plan: Distal esophagus cancer Esophagram 11/21/2020-moderate to severe esophageal dysmotility, narrowing of the distal esophagus just above the GE junction to 4 mm, 13 mm barium tablet stuck in the distal esophagus Upper endoscopy 12/13/2020-severe esophagitis with nodularity and stricturing at 36 cm-adenocarcinoma, HER2 negative, no loss of mismatch repair protein expression, PD-L1 combined positive score less than 1% CT chest 12/14/2020-accentuated density at the distal esophageal lumen above a small type I hiatal hernia concerning for malignancy, nonpathologically enlarged AP window and right hilar nodes PET 12/21/2020-abnormal FDG uptake at the distal esophagus, no evidence of nodal or distant metastatic disease Radiation  01/08/2021-02/16/2021 Cycle 1 weekly Taxol/carboplatin 01/08/2021 Cycle 2 weekly Taxol/carboplatin 01/15/2021 Treatment held 01/22/2021 due to neutropenia Positive COVID test 02/02/2021 Taxol/carboplatin 02/15/2021 Endoscopy 04/30/2021-shallow ulceration at the distal esophagus, nodularity at the gastric cardia-biopsies negative for malignancy CTs 04/02/2022-small mediastinal and right hilar lymph nodes.  Esophagus is mildly patulous decreased from previous in the mid to upper aspect of the mediastinum.  More distally the wall thickening of the esophagus appears slightly decreased.  Small hiatal hernia.  The area of thickening does correspond to the abnormality on PET-CT.  Emphysema. Endoscopy 04/15/2022-Short segment salmon-colored mucosa 1 cm above the Z-line.  Scarring at the gastroesophageal junction/Z-line with nonobstructing narrowing.  Nodular mucosa in the gastric cardia immediately distal to the Z-line.  Pathology on GE junction nodule-invasive moderately differentiated adenocarcinoma, tumor arises within a background of high-grade dysplasia, mild chronic active gastritis with foveolar hyperplasia, negative for squamous epithelium and intestinal metaplasia.  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  Negative for HER2 by IHC, PD-L1 CPS 2% PET scan 04/25/2022-increased radiotracer uptake associated with the distal esophagus at the level of the GE junction.  No evidence of nodal metastasis or distant metastatic disease. Cycle 1 FOLFOX 06/04/2022 Cycle 2 FOLFOX 06/18/2022, oxaliplatin dose reduced secondary thrombocytopenia Cycle 3 FOLFOX 06/24/2022, oxaliplatin dose reduced secondary thrombocytopenia, 5-FU dose reduced secondary to hand/foot syndrome 07/16/2022 chemotherapy held due to thrombocytopenia, dehydration Cycle 4 FOLFOX 07/23/2022, oxaliplatin dose reduced due to thrombocytopenia Solid dysphagia secondary #1, resolved COPD on chest CT  12/14/2020 Hemorrhoids Hyperlipidemia Irritable bowel syndrome Hypothyroidism Tobacco use Thrombocytopenia secondary to chemotherapy Hand/foot syndrome-5-FU dose reduced with cycle 3    Disposition: Alison Garner appears stable.  She  has completed 3 cycles of FOLFOX.  Cycle 4 was held last week due to thrombocytopenia and dehydration.  She received IV fluids.  Plan to proceed with cycle 4 today as scheduled.  Oxaliplatin has been further dose reduced due to the thrombocytopenia.  Plan for restaging upper endoscopy after she has completed 5 cycles.  CBC and chemistry panel reviewed.  Labs adequate to proceed with treatment today as scheduled.  Platelet count is in low normal range.  Oxaliplatin further dose reduced as noted above.  She will return for a follow-up CBC in 1 week.  We will contact the office with bleeding.  She will return for lab, follow-up and cycle 5 FOLFOX in 2 weeks.  We are available to see her sooner if needed.  [name] ANP/GNP-BC   07/23/2022  9:53 AM

## 2022-07-25 ENCOUNTER — Inpatient Hospital Stay: Payer: Medicare Other

## 2022-07-25 VITALS — BP 118/48 | HR 93 | Temp 97.7°F | Resp 18

## 2022-07-25 DIAGNOSIS — Z5111 Encounter for antineoplastic chemotherapy: Secondary | ICD-10-CM | POA: Diagnosis not present

## 2022-07-25 DIAGNOSIS — C155 Malignant neoplasm of lower third of esophagus: Secondary | ICD-10-CM

## 2022-07-25 MED ORDER — SODIUM CHLORIDE 0.9% FLUSH
10.0000 mL | INTRAVENOUS | Status: DC | PRN
  Administered 2022-07-25: 10 mL

## 2022-07-25 MED ORDER — PEGFILGRASTIM-CBQV 6 MG/0.6ML ~~LOC~~ SOSY
6.0000 mg | PREFILLED_SYRINGE | Freq: Once | SUBCUTANEOUS | Status: AC
  Administered 2022-07-25: 6 mg via SUBCUTANEOUS
  Filled 2022-07-25: qty 0.6

## 2022-07-25 MED ORDER — HEPARIN SOD (PORK) LOCK FLUSH 100 UNIT/ML IV SOLN
500.0000 [IU] | Freq: Once | INTRAVENOUS | Status: AC | PRN
  Administered 2022-07-25: 500 [IU]

## 2022-07-29 ENCOUNTER — Other Ambulatory Visit: Payer: Self-pay

## 2022-07-29 ENCOUNTER — Encounter: Payer: Self-pay | Admitting: Nurse Practitioner

## 2022-07-29 DIAGNOSIS — C155 Malignant neoplasm of lower third of esophagus: Secondary | ICD-10-CM

## 2022-07-29 NOTE — Telephone Encounter (Signed)
TC to Pt in reference to IV fluids. Pt stated she had diarrhea for 3 days but that is not unusual because she has IBS. Pt stated she took imodium and she's drinking fluids. Informed her when she comes tomorrow she can wait after she gets her labs to see if she would need fluids.

## 2022-07-30 ENCOUNTER — Inpatient Hospital Stay: Payer: Medicare Other

## 2022-07-30 ENCOUNTER — Inpatient Hospital Stay: Payer: Medicare Other | Admitting: Nurse Practitioner

## 2022-07-30 DIAGNOSIS — Z5111 Encounter for antineoplastic chemotherapy: Secondary | ICD-10-CM | POA: Diagnosis not present

## 2022-07-30 DIAGNOSIS — C155 Malignant neoplasm of lower third of esophagus: Secondary | ICD-10-CM

## 2022-07-30 LAB — CBC WITH DIFFERENTIAL (CANCER CENTER ONLY)
Abs Immature Granulocytes: 1.18 10*3/uL — ABNORMAL HIGH (ref 0.00–0.07)
Basophils Absolute: 0.1 10*3/uL (ref 0.0–0.1)
Basophils Relative: 0 %
Eosinophils Absolute: 0.2 10*3/uL (ref 0.0–0.5)
Eosinophils Relative: 1 %
HCT: 35.6 % — ABNORMAL LOW (ref 36.0–46.0)
Hemoglobin: 12.5 g/dL (ref 12.0–15.0)
Immature Granulocytes: 4 %
Lymphocytes Relative: 8 %
Lymphs Abs: 2.3 10*3/uL (ref 0.7–4.0)
MCH: 34.6 pg — ABNORMAL HIGH (ref 26.0–34.0)
MCHC: 35.1 g/dL (ref 30.0–36.0)
MCV: 98.6 fL (ref 80.0–100.0)
Monocytes Absolute: 3.7 10*3/uL — ABNORMAL HIGH (ref 0.1–1.0)
Monocytes Relative: 12 %
Neutro Abs: 22.3 10*3/uL — ABNORMAL HIGH (ref 1.7–7.7)
Neutrophils Relative %: 75 %
Platelet Count: 87 10*3/uL — ABNORMAL LOW (ref 150–400)
RBC: 3.61 MIL/uL — ABNORMAL LOW (ref 3.87–5.11)
RDW: 19.7 % — ABNORMAL HIGH (ref 11.5–15.5)
WBC Count: 29.7 10*3/uL — ABNORMAL HIGH (ref 4.0–10.5)
nRBC: 0.1 % (ref 0.0–0.2)

## 2022-07-30 LAB — BASIC METABOLIC PANEL - CANCER CENTER ONLY
Anion gap: 7 (ref 5–15)
BUN: 7 mg/dL — ABNORMAL LOW (ref 8–23)
CO2: 28 mmol/L (ref 22–32)
Calcium: 9.5 mg/dL (ref 8.9–10.3)
Chloride: 98 mmol/L (ref 98–111)
Creatinine: 0.83 mg/dL (ref 0.44–1.00)
GFR, Estimated: >60 mL/min (ref >60)
Glucose, Bld: 77 mg/dL (ref 70–99)
Potassium: 4 mmol/L (ref 3.5–5.1)
Sodium: 133 mmol/L — ABNORMAL LOW (ref 135–145)

## 2022-08-01 ENCOUNTER — Telehealth: Payer: Self-pay

## 2022-08-01 ENCOUNTER — Inpatient Hospital Stay: Payer: Medicare Other

## 2022-08-01 NOTE — Telephone Encounter (Signed)
-----   Message from Rana Snare, NP sent at 08/01/2022  4:27 PM EDT ----- Please let her know platelets are mildly decreased, call with any bleeding, follow-up as scheduled.

## 2022-08-01 NOTE — Telephone Encounter (Signed)
Patient gave verbal understanding and had no further questions or concerns  

## 2022-08-04 ENCOUNTER — Other Ambulatory Visit: Payer: Self-pay | Admitting: Oncology

## 2022-08-05 ENCOUNTER — Ambulatory Visit: Payer: Medicare Other

## 2022-08-05 ENCOUNTER — Encounter: Payer: Self-pay | Admitting: Nurse Practitioner

## 2022-08-05 ENCOUNTER — Inpatient Hospital Stay: Payer: Medicare Other | Attending: Oncology

## 2022-08-05 ENCOUNTER — Telehealth: Payer: Self-pay | Admitting: Internal Medicine

## 2022-08-05 ENCOUNTER — Inpatient Hospital Stay: Payer: Medicare Other

## 2022-08-05 ENCOUNTER — Encounter: Payer: Self-pay | Admitting: *Deleted

## 2022-08-05 ENCOUNTER — Inpatient Hospital Stay (HOSPITAL_BASED_OUTPATIENT_CLINIC_OR_DEPARTMENT_OTHER): Payer: Medicare Other | Admitting: Nurse Practitioner

## 2022-08-05 VITALS — BP 130/82 | HR 96 | Temp 98.1°F | Resp 18 | Ht 65.0 in | Wt 136.0 lb

## 2022-08-05 VITALS — BP 149/65 | HR 83

## 2022-08-05 DIAGNOSIS — Z5111 Encounter for antineoplastic chemotherapy: Secondary | ICD-10-CM | POA: Diagnosis present

## 2022-08-05 DIAGNOSIS — K649 Unspecified hemorrhoids: Secondary | ICD-10-CM | POA: Diagnosis not present

## 2022-08-05 DIAGNOSIS — E039 Hypothyroidism, unspecified: Secondary | ICD-10-CM | POA: Diagnosis not present

## 2022-08-05 DIAGNOSIS — Z5189 Encounter for other specified aftercare: Secondary | ICD-10-CM | POA: Insufficient documentation

## 2022-08-05 DIAGNOSIS — D6959 Other secondary thrombocytopenia: Secondary | ICD-10-CM | POA: Diagnosis not present

## 2022-08-05 DIAGNOSIS — K589 Irritable bowel syndrome without diarrhea: Secondary | ICD-10-CM | POA: Insufficient documentation

## 2022-08-05 DIAGNOSIS — C155 Malignant neoplasm of lower third of esophagus: Secondary | ICD-10-CM

## 2022-08-05 DIAGNOSIS — Z8501 Personal history of malignant neoplasm of esophagus: Secondary | ICD-10-CM

## 2022-08-05 DIAGNOSIS — E785 Hyperlipidemia, unspecified: Secondary | ICD-10-CM | POA: Diagnosis not present

## 2022-08-05 DIAGNOSIS — G620 Drug-induced polyneuropathy: Secondary | ICD-10-CM | POA: Diagnosis not present

## 2022-08-05 DIAGNOSIS — Q788 Other specified osteochondrodysplasias: Secondary | ICD-10-CM

## 2022-08-05 LAB — CMP (CANCER CENTER ONLY)
ALT: 19 U/L (ref 0–44)
AST: 23 U/L (ref 15–41)
Albumin: 3.9 g/dL (ref 3.5–5.0)
Alkaline Phosphatase: 139 U/L — ABNORMAL HIGH (ref 38–126)
Anion gap: 7 (ref 5–15)
BUN: 6 mg/dL — ABNORMAL LOW (ref 8–23)
CO2: 27 mmol/L (ref 22–32)
Calcium: 9.6 mg/dL (ref 8.9–10.3)
Chloride: 100 mmol/L (ref 98–111)
Creatinine: 0.98 mg/dL (ref 0.44–1.00)
GFR, Estimated: >60 mL/min (ref >60)
Glucose, Bld: 102 mg/dL — ABNORMAL HIGH (ref 70–99)
Potassium: 4.3 mmol/L (ref 3.5–5.1)
Sodium: 134 mmol/L — ABNORMAL LOW (ref 135–145)
Total Bilirubin: 0.6 mg/dL (ref 0.3–1.2)
Total Protein: 6.6 g/dL (ref 6.5–8.1)

## 2022-08-05 LAB — CBC WITH DIFFERENTIAL (CANCER CENTER ONLY)
Abs Immature Granulocytes: 0.44 10*3/uL — ABNORMAL HIGH (ref 0.00–0.07)
Basophils Absolute: 0 10*3/uL (ref 0.0–0.1)
Basophils Relative: 0 %
Eosinophils Absolute: 0.2 10*3/uL (ref 0.0–0.5)
Eosinophils Relative: 2 %
HCT: 36 % (ref 36.0–46.0)
Hemoglobin: 12.6 g/dL (ref 12.0–15.0)
Immature Granulocytes: 4 %
Lymphocytes Relative: 13 %
Lymphs Abs: 1.5 10*3/uL (ref 0.7–4.0)
MCH: 35.1 pg — ABNORMAL HIGH (ref 26.0–34.0)
MCHC: 35 g/dL (ref 30.0–36.0)
MCV: 100.3 fL — ABNORMAL HIGH (ref 80.0–100.0)
Monocytes Absolute: 0.8 10*3/uL (ref 0.1–1.0)
Monocytes Relative: 7 %
Neutro Abs: 8.4 10*3/uL — ABNORMAL HIGH (ref 1.7–7.7)
Neutrophils Relative %: 74 %
Platelet Count: 109 10*3/uL — ABNORMAL LOW (ref 150–400)
RBC: 3.59 MIL/uL — ABNORMAL LOW (ref 3.87–5.11)
RDW: 20.6 % — ABNORMAL HIGH (ref 11.5–15.5)
WBC Count: 11.5 10*3/uL — ABNORMAL HIGH (ref 4.0–10.5)
nRBC: 0.2 % (ref 0.0–0.2)

## 2022-08-05 LAB — CEA (ACCESS): CEA (CHCC): 14.51 ng/mL — ABNORMAL HIGH (ref 0.00–5.00)

## 2022-08-05 MED ORDER — SODIUM CHLORIDE 0.9 % IV SOLN
10.0000 mg | Freq: Once | INTRAVENOUS | Status: AC
  Administered 2022-08-05: 10 mg via INTRAVENOUS
  Filled 2022-08-05: qty 10

## 2022-08-05 MED ORDER — SODIUM CHLORIDE 0.9 % IV SOLN
1800.0000 mg/m2 | INTRAVENOUS | Status: DC
  Administered 2022-08-05: 3100 mg via INTRAVENOUS
  Filled 2022-08-05: qty 62

## 2022-08-05 MED ORDER — OXALIPLATIN CHEMO INJECTION 100 MG/20ML
35.0000 mg/m2 | Freq: Once | INTRAVENOUS | Status: AC
  Administered 2022-08-05: 60 mg via INTRAVENOUS
  Filled 2022-08-05: qty 12

## 2022-08-05 MED ORDER — LEUCOVORIN CALCIUM INJECTION 350 MG
300.0000 mg/m2 | Freq: Once | INTRAVENOUS | Status: AC
  Administered 2022-08-05: 516 mg via INTRAVENOUS
  Filled 2022-08-05: qty 25.8

## 2022-08-05 MED ORDER — FLUOROURACIL CHEMO INJECTION 500 MG/10ML
300.0000 mg/m2 | Freq: Once | INTRAVENOUS | Status: AC
  Administered 2022-08-05: 500 mg via INTRAVENOUS
  Filled 2022-08-05: qty 10

## 2022-08-05 MED ORDER — LEUCOVORIN CALCIUM INJECTION 350 MG
300.0000 mg/m2 | Freq: Once | INTRAVENOUS | Status: DC
  Filled 2022-08-05: qty 25.8

## 2022-08-05 MED ORDER — DEXTROSE 5 % IV SOLN: Freq: Once | INTRAVENOUS | Status: AC

## 2022-08-05 MED ORDER — PALONOSETRON HCL INJECTION 0.25 MG/5ML
0.2500 mg | Freq: Once | INTRAVENOUS | Status: AC
  Administered 2022-08-05: 0.25 mg via INTRAVENOUS
  Filled 2022-08-05: qty 5

## 2022-08-05 NOTE — Progress Notes (Signed)
Patient seen by [name] NP today  Vitals are within treatment parameters.No intervention needed for BP 130/82  Labs reviewed by [name] NP and are within treatment parameters.  Per physician team, patient is ready for treatment and there are NO modifications to the treatment plan.

## 2022-08-05 NOTE — Telephone Encounter (Signed)
Pt states she is getting chemo tx #5 and that Dr. Truett Perna wants her to have a repeat EGD done prior to her next office visit. Pt is scheduled to see Dr. Truett Perna again on 08/20/22. Please advise if ok to schedule EGD. I do not see any openings on your schedule but other providers may have a slot.

## 2022-08-05 NOTE — Patient Instructions (Signed)

## 2022-08-05 NOTE — Progress Notes (Signed)
[facility] OFFICE PROGRESS NOTE   Diagnosis: Esophagus cancer  INTERVAL HISTORY:   Alison Garner returns as scheduled.  She completed cycle 4 FOLFOX 07/23/2022.  Oxaliplatin was further dose reduced due to thrombocytopenia.  She denies nausea/vomit.  No mouth sores.  No change in baseline bowel habits.  She did not experience cold sensitivity.  No numbness or tingling in the hands or feet.  Objective:  Vital signs in last 24 hours:  Blood pressure 130/82, pulse 96, temperature 98.1 F (36.7 C), temperature source Oral, resp. rate 18, height 5\' 5"  (1.651 m), weight 136 lb (61.7 kg), SpO2 99 %.    HEENT: No thrush or ulcers. Resp: Lungs clear bilaterally. Cardio: Regular rate and rhythm. GI: No hepatosplenomegaly. Vascular: No leg edema. Neuro: Vibratory sense intact over the fingertips per tuning fork exam. Skin: Palms mildly dry appearing. Port-A-Cath without erythema.  Lab Results:  Lab Results  Component Value Date   WBC 11.5 (H) 08/05/2022   HGB 12.6 08/05/2022   HCT 36.0 08/05/2022   MCV 100.3 (H) 08/05/2022   PLT 109 (L) 08/05/2022   NEUTROABS 8.4 (H) 08/05/2022    Imaging:  No results found.  Medications: I have reviewed the patient's current medications.  Assessment/Plan: Distal esophagus cancer Esophagram 11/21/2020-moderate to severe esophageal dysmotility, narrowing of the distal esophagus just above the GE junction to 4 mm, 13 mm barium tablet stuck in the distal esophagus Upper endoscopy 12/13/2020-severe esophagitis with nodularity and stricturing at 36 cm-adenocarcinoma, HER2 negative, no loss of mismatch repair protein expression, PD-L1 combined positive score less than 1% CT chest 12/14/2020-accentuated density at the distal esophageal lumen above a small type I hiatal hernia concerning for malignancy, nonpathologically enlarged AP window and right hilar nodes PET 12/21/2020-abnormal FDG uptake at the distal esophagus, no evidence of nodal  or distant metastatic disease Radiation 01/08/2021-02/16/2021 Cycle 1 weekly Taxol/carboplatin 01/08/2021 Cycle 2 weekly Taxol/carboplatin 01/15/2021 Treatment held 01/22/2021 due to neutropenia Positive COVID test 02/02/2021 Taxol/carboplatin 02/15/2021 Endoscopy 04/30/2021-shallow ulceration at the distal esophagus, nodularity at the gastric cardia-biopsies negative for malignancy CTs 04/02/2022-small mediastinal and right hilar lymph nodes.  Esophagus is mildly patulous decreased from previous in the mid to upper aspect of the mediastinum.  More distally the wall thickening of the esophagus appears slightly decreased.  Small hiatal hernia.  The area of thickening does correspond to the abnormality on PET-CT.  Emphysema. Endoscopy 04/15/2022-Short segment salmon-colored mucosa 1 cm above the Z-line.  Scarring at the gastroesophageal junction/Z-line with nonobstructing narrowing.  Nodular mucosa in the gastric cardia immediately distal to the Z-line.  Pathology on GE junction nodule-invasive moderately differentiated adenocarcinoma, tumor arises within a background of high-grade dysplasia, mild chronic active gastritis with foveolar hyperplasia, negative for squamous epithelium and intestinal metaplasia.  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  Negative for HER2 by IHC, PD-L1 CPS 2% PET scan 04/25/2022-increased radiotracer uptake associated with the distal esophagus at the level of the GE junction.  No evidence of nodal metastasis or distant metastatic disease. Cycle 1 FOLFOX 06/04/2022 Cycle 2 FOLFOX 06/18/2022, oxaliplatin dose reduced secondary thrombocytopenia Cycle 3 FOLFOX 06/24/2022, oxaliplatin dose reduced secondary thrombocytopenia, 5-FU dose reduced secondary to hand/foot syndrome 07/16/2022 chemotherapy held due to thrombocytopenia, dehydration Cycle 4 FOLFOX 07/23/2022, oxaliplatin dose reduced due to thrombocytopenia Cycle 5 FOLFOX 08/05/2022 Solid dysphagia secondary  #1, resolved COPD on chest CT 12/14/2020 Hemorrhoids Hyperlipidemia Irritable bowel syndrome Hypothyroidism Tobacco use Thrombocytopenia secondary to chemotherapy Hand/foot syndrome-5-FU dose reduced with cycle 3  Disposition: [name] appears stable.  She has completed 4 cycles of FOLFOX.  Plan to proceed with cycle 5 today as scheduled.  Restaging chest CT and upper endoscopy prior to next office visit.  CBC and chemistry panel reviewed.  Labs adequate to proceed with treatment today as scheduled.  She has mild thrombocytopenia.  She will return for a repeat CBC in 1 week.  She will return for follow-up in 2 weeks.    Alison Garner ANP/GNP-BC   08/05/2022  11:26 AM

## 2022-08-05 NOTE — Patient Instructions (Addendum)
[facility]   The chemotherapy medication bag should finish at 46 hours, 96 hours, or 7 days. For example, if your pump is scheduled for 46 hours and it was put on at 4:00 p.m., it should finish at 2:00 p.m. the day it is scheduled to come off regardless of your appointment time.     Estimated time to finish at 12:15  Wednesday, August 07, 2022.   If the display on your pump reads "Low Volume" and it is beeping, take the batteries out of the pump and come to the cancer center for it to be taken off.   If the pump alarms go off prior to the pump reading "Low Volume" then call [phone] and someone can assist you.  If the plunger comes out and the chemotherapy medication is leaking out, please use your home chemo spill kit to clean up the spill. Do NOT use paper towels or other household products.  If you have problems or questions regarding your pump, please call either [phone] (24 hours a day) or the cancer center Monday-Friday 8:00 a.m.- 4:30 p.m. at the clinic number and we will assist you. If you are unable to get assistance, then go to the nearest Emergency Department and ask the staff to contact the IV team for assistance.  Discharge Instructions: Thank you for choosing [facility] to provide your oncology and hematology care.   If you have a lab appointment with the Cancer Center, please go directly to the Cancer Center and check in at the registration area.   Wear comfortable clothing and clothing appropriate for easy access to any Portacath or PICC line.   We strive to give you quality time with your provider. You may need to reschedule your appointment if you arrive late (15 or more minutes).  Arriving late affects you and other patients whose appointments are after yours.  Also, if you miss three or more appointments without notifying the office, you may be dismissed from the clinic at the provider's discretion.      For  prescription refill requests, have your pharmacy contact our office and allow 72 hours for refills to be completed.    Today you received the following chemotherapy and/or immunotherapy agents Oxaliplatin, Leucovorin, Fluorouracil      To help prevent nausea and vomiting after your treatment, we encourage you to take your nausea medication as directed.  BELOW ARE SYMPTOMS THAT SHOULD BE REPORTED IMMEDIATELY: *FEVER GREATER THAN 100.4 F (38 C) OR HIGHER *CHILLS OR SWEATING *NAUSEA AND VOMITING THAT IS NOT CONTROLLED WITH YOUR NAUSEA MEDICATION *UNUSUAL SHORTNESS OF BREATH *UNUSUAL BRUISING OR BLEEDING *URINARY PROBLEMS (pain or burning when urinating, or frequent urination) *BOWEL PROBLEMS (unusual diarrhea, constipation, pain near the anus) TENDERNESS IN MOUTH AND THROAT WITH OR WITHOUT PRESENCE OF ULCERS (sore throat, sores in mouth, or a toothache) UNUSUAL RASH, SWELLING OR PAIN  UNUSUAL VAGINAL DISCHARGE OR ITCHING   Items with * indicate a potential emergency and should be followed up as soon as possible or go to the Emergency Department if any problems should occur.  Please show the CHEMOTHERAPY ALERT CARD or IMMUNOTHERAPY ALERT CARD at check-in to the Emergency Department and triage nurse.  Should you have questions after your visit or need to cancel or reschedule your appointment, please contact [facility]  Dept: [phone]  and follow the prompts.  Office hours are 8:00 a.m. to 4:30 p.m. Monday - Friday. Please  note that voicemails left after 4:00 p.m. may not be returned until the following business day.  We are closed weekends and major holidays. You have access to a nurse at all times for urgent questions. Please call the main number to the clinic Dept: [phone] and follow the prompts.   For any non-urgent questions, you may also contact your provider using MyChart. We now offer e-Visits for anyone 60 and older to request care online for  non-urgent symptoms. For details visit [email or web address].   Also download the MyChart app! Go to the app store, search "MyChart", open the app, select [place], and log in with your MyChart username and password.  Oxaliplatin Injection What is this medication? OXALIPLATIN (ox AL i PLA tin) treats colorectal cancer. It works by slowing down the growth of cancer cells. This medicine may be used for other purposes; ask your health care provider or pharmacist if you have questions. COMMON BRAND NAME(S): Eloxatin What should I tell my care team before I take this medication? They need to know if you have any of these conditions: Heart disease History of irregular heartbeat or rhythm Liver disease Low blood cell levels (white cells, red cells, and platelets) Lung or breathing disease, such as asthma Take medications that treat or prevent blood clots Tingling of the fingers, toes, or other nerve disorder An unusual or allergic reaction to oxaliplatin, other medications, foods, dyes, or preservatives If you or your partner are pregnant or trying to get pregnant Breast-feeding How should I use this medication? This medication is injected into a vein. It is given by your care team in a hospital or clinic setting. Talk to your care team about the use of this medication in children. Special care may be needed. Overdosage: If you think you have taken too much of this medicine contact a poison control center or emergency room at once. NOTE: This medicine is only for you. Do not share this medicine with others. What if I miss a dose? Keep appointments for follow-up doses. It is important not to miss a dose. Call your care team if you are unable to keep an appointment. What may interact with this medication? Do not take this medication with any of the following: Cisapride Dronedarone Pimozide Thioridazine This medication may also interact with the following: Aspirin and aspirin-like  medications Certain medications that treat or prevent blood clots, such as warfarin, apixaban, dabigatran, and rivaroxaban Cisplatin Cyclosporine Diuretics Medications for infection, such as acyclovir, adefovir, amphotericin B, bacitracin, cidofovir, foscarnet, ganciclovir, gentamicin, pentamidine, vancomycin NSAIDs, medications for pain and inflammation, such as ibuprofen or naproxen Other medications that cause heart rhythm changes Pamidronate Zoledronic acid This list may not describe all possible interactions. Give your health care provider a list of all the medicines, herbs, non-prescription drugs, or dietary supplements you use. Also tell them if you smoke, drink alcohol, or use illegal drugs. Some items may interact with your medicine. What should I watch for while using this medication? Your condition will be monitored carefully while you are receiving this medication. You may need blood work while taking this medication. This medication may make you feel generally unwell. This is not uncommon as chemotherapy can affect healthy cells as well as cancer cells. Report any side effects. Continue your course of treatment even though you feel ill unless your care team tells you to stop. This medication may increase your risk of getting an infection. Call your care team for advice if you get a fever, chills, sore throat,  or other symptoms of a cold or flu. Do not treat yourself. Try to avoid being around people who are sick. Avoid taking medications that contain aspirin, acetaminophen, ibuprofen, naproxen, or ketoprofen unless instructed by your care team. These medications may hide a fever. Be careful brushing or flossing your teeth or using a toothpick because you may get an infection or bleed more easily. If you have any dental work done, tell your dentist you are receiving this medication. This medication can make you more sensitive to cold. Do not drink cold drinks or use ice. Cover exposed  skin before coming in contact with cold temperatures or cold objects. When out in cold weather wear warm clothing and cover your mouth and nose to warm the air that goes into your lungs. Tell your care team if you get sensitive to the cold. Talk to your care team if you or your partner are pregnant or think either of you might be pregnant. This medication can cause serious birth defects if taken during pregnancy and for 9 months after the last dose. A negative pregnancy test is required before starting this medication. A reliable form of contraception is recommended while taking this medication and for 9 months after the last dose. Talk to your care team about effective forms of contraception. Do not father a child while taking this medication and for 6 months after the last dose. Use a condom while having sex during this time period. Do not breastfeed while taking this medication and for 3 months after the last dose. This medication may cause infertility. Talk to your care team if you are concerned about your fertility. What side effects may I notice from receiving this medication? Side effects that you should report to your care team as soon as possible: Allergic reactions--skin rash, itching, hives, swelling of the face, lips, tongue, or throat Bleeding--bloody or black, tar-like stools, vomiting blood or brown material that looks like coffee grounds, red or dark brown urine, small red or purple spots on skin, unusual bruising or bleeding Dry cough, shortness of breath or trouble breathing Heart rhythm changes--fast or irregular heartbeat, dizziness, feeling faint or lightheaded, chest pain, trouble breathing Infection--fever, chills, cough, sore throat, wounds that don't heal, pain or trouble when passing urine, general feeling of discomfort or being unwell Liver injury--right upper belly pain, loss of appetite, nausea, light-colored stool, dark yellow or brown urine, yellowing skin or eyes, unusual  weakness or fatigue Low red blood cell level--unusual weakness or fatigue, dizziness, headache, trouble breathing Muscle injury--unusual weakness or fatigue, muscle pain, dark yellow or brown urine, decrease in amount of urine Pain, tingling, or numbness in the hands or feet Sudden and severe headache, confusion, change in vision, seizures, which may be signs of posterior reversible encephalopathy syndrome (PRES) Unusual bruising or bleeding Side effects that usually do not require medical attention (report to your care team if they continue or are bothersome): Diarrhea Nausea Pain, redness, or swelling with sores inside the mouth or throat Unusual weakness or fatigue Vomiting This list may not describe all possible side effects. Call your doctor for medical advice about side effects. You may report side effects to FDA at 1-800-FDA-1088. Where should I keep my medication? This medication is given in a hospital or clinic. It will not be stored at home. NOTE: This sheet is a summary. It may not cover all possible information. If you have questions about this medicine, talk to your doctor, pharmacist, or health care provider.  2024 Elsevier/Gold  Standard (2022-03-31 00:00:00) Leucovorin Injection What is this medication? LEUCOVORIN (loo koe VOR in) prevents side effects from certain medications, such as methotrexate. It works by increasing folate levels. This helps protect healthy cells in your body. It may also be used to treat anemia caused by low levels of folate. It can also be used with fluorouracil, a type of chemotherapy, to treat colorectal cancer. It works by increasing the effects of fluorouracil in the body. This medicine may be used for other purposes; ask your health care provider or pharmacist if you have questions. What should I tell my care team before I take this medication? They need to know if you have any of these conditions: Anemia from low levels of vitamin B12 in the  blood An unusual or allergic reaction to leucovorin, folic acid, other medications, foods, dyes, or preservatives Pregnant or trying to get pregnant Breastfeeding How should I use this medication? This medication is injected into a vein or a muscle. It is given by your care team in a hospital or clinic setting. Talk to your care team about the use of this medication in children. Special care may be needed. Overdosage: If you think you have taken too much of this medicine contact a poison control center or emergency room at once. NOTE: This medicine is only for you. Do not share this medicine with others. What if I miss a dose? Keep appointments for follow-up doses. It is important not to miss your dose. Call your care team if you are unable to keep an appointment. What may interact with this medication? Capecitabine Fluorouracil Phenobarbital Phenytoin Primidone Trimethoprim;sulfamethoxazole This list may not describe all possible interactions. Give your health care provider a list of all the medicines, herbs, non-prescription drugs, or dietary supplements you use. Also tell them if you smoke, drink alcohol, or use illegal drugs. Some items may interact with your medicine. What should I watch for while using this medication? Your condition will be monitored carefully while you are receiving this medication. This medication may increase the side effects of 5-fluorouracil. Tell your care team if you have diarrhea or mouth sores that do not get better or that get worse. What side effects may I notice from receiving this medication? Side effects that you should report to your care team as soon as possible: Allergic reactions--skin rash, itching, hives, swelling of the face, lips, tongue, or throat This list may not describe all possible side effects. Call your doctor for medical advice about side effects. You may report side effects to FDA at 1-800-FDA-1088. Where should I keep my  medication? This medication is given in a hospital or clinic. It will not be stored at home. NOTE: This sheet is a summary. It may not cover all possible information. If you have questions about this medicine, talk to your doctor, pharmacist, or health care provider.  2024 Elsevier/Gold Standard (2021-06-26 00:00:00) Fluorouracil Injection What is this medication? FLUOROURACIL (flure oh YOOR a sil) treats some types of cancer. It works by slowing down the growth of cancer cells. This medicine may be used for other purposes; ask your health care provider or pharmacist if you have questions. COMMON BRAND NAME(S): Adrucil What should I tell my care team before I take this medication? They need to know if you have any of these conditions: Blood disorders Dihydropyrimidine dehydrogenase (DPD) deficiency Infection, such as chickenpox, cold sores, herpes Kidney disease Liver disease Poor nutrition Recent or ongoing radiation therapy An unusual or allergic reaction to fluorouracil,  other medications, foods, dyes, or preservatives If you or your partner are pregnant or trying to get pregnant Breast-feeding How should I use this medication? This medication is injected into a vein. It is administered by your care team in a hospital or clinic setting. Talk to your care team about the use of this medication in children. Special care may be needed. Overdosage: If you think you have taken too much of this medicine contact a poison control center or emergency room at once. NOTE: This medicine is only for you. Do not share this medicine with others. What if I miss a dose? Keep appointments for follow-up doses. It is important not to miss your dose. Call your care team if you are unable to keep an appointment. What may interact with this medication? Do not take this medication with any of the following: Live virus vaccines This medication may also interact with the following: Medications that treat or  prevent blood clots, such as warfarin, enoxaparin, dalteparin This list may not describe all possible interactions. Give your health care provider a list of all the medicines, herbs, non-prescription drugs, or dietary supplements you use. Also tell them if you smoke, drink alcohol, or use illegal drugs. Some items may interact with your medicine. What should I watch for while using this medication? Your condition will be monitored carefully while you are receiving this medication. This medication may make you feel generally unwell. This is not uncommon as chemotherapy can affect healthy cells as well as cancer cells. Report any side effects. Continue your course of treatment even though you feel ill unless your care team tells you to stop. In some cases, you may be given additional medications to help with side effects. Follow all directions for their use. This medication may increase your risk of getting an infection. Call your care team for advice if you get a fever, chills, sore throat, or other symptoms of a cold or flu. Do not treat yourself. Try to avoid being around people who are sick. This medication may increase your risk to bruise or bleed. Call your care team if you notice any unusual bleeding. Be careful brushing or flossing your teeth or using a toothpick because you may get an infection or bleed more easily. If you have any dental work done, tell your dentist you are receiving this medication. Avoid taking medications that contain aspirin, acetaminophen, ibuprofen, naproxen, or ketoprofen unless instructed by your care team. These medications may hide a fever. Do not treat diarrhea with over the counter products. Contact your care team if you have diarrhea that lasts more than 2 days or if it is severe and watery. This medication can make you more sensitive to the sun. Keep out of the sun. If you cannot avoid being in the sun, wear protective clothing and sunscreen. Do not use sun lamps,  tanning beds, or tanning booths. Talk to your care team if you or your partner wish to become pregnant or think you might be pregnant. This medication can cause serious birth defects if taken during pregnancy and for 3 months after the last dose. A reliable form of contraception is recommended while taking this medication and for 3 months after the last dose. Talk to your care team about effective forms of contraception. Do not father a child while taking this medication and for 3 months after the last dose. Use a condom while having sex during this time period. Do not breastfeed while taking this medication. This medication may  cause infertility. Talk to your care team if you are concerned about your fertility. What side effects may I notice from receiving this medication? Side effects that you should report to your care team as soon as possible: Allergic reactions--skin rash, itching, hives, swelling of the face, lips, tongue, or throat Heart attack--pain or tightness in the chest, shoulders, arms, or jaw, nausea, shortness of breath, cold or clammy skin, feeling faint or lightheaded Heart failure--shortness of breath, swelling of the ankles, feet, or hands, sudden weight gain, unusual weakness or fatigue Heart rhythm changes--fast or irregular heartbeat, dizziness, feeling faint or lightheaded, chest pain, trouble breathing High ammonia level--unusual weakness or fatigue, confusion, loss of appetite, nausea, vomiting, seizures Infection--fever, chills, cough, sore throat, wounds that don't heal, pain or trouble when passing urine, general feeling of discomfort or being unwell Low red blood cell level--unusual weakness or fatigue, dizziness, headache, trouble breathing Pain, tingling, or numbness in the hands or feet, muscle weakness, change in vision, confusion or trouble speaking, loss of balance or coordination, trouble walking, seizures Redness, swelling, and blistering of the skin over hands and  feet Severe or prolonged diarrhea Unusual bruising or bleeding Side effects that usually do not require medical attention (report to your care team if they continue or are bothersome): Dry skin Headache Increased tears Nausea Pain, redness, or swelling with sores inside the mouth or throat Sensitivity to light Vomiting This list may not describe all possible side effects. Call your doctor for medical advice about side effects. You may report side effects to FDA at 1-800-FDA-1088. Where should I keep my medication? This medication is given in a hospital or clinic. It will not be stored at home. NOTE: This sheet is a summary. It may not cover all possible information. If you have questions about this medicine, talk to your doctor, pharmacist, or health care provider.  2024 Elsevier/Gold Standard (2021-05-29 00:00:00)

## 2022-08-05 NOTE — Telephone Encounter (Signed)
PT is calling to see if she can have EGD done early. Her oncologist feels she needs to have it done sooner than 2025. Please advise of scheduling

## 2022-08-06 ENCOUNTER — Other Ambulatory Visit: Payer: Self-pay

## 2022-08-06 ENCOUNTER — Encounter: Payer: Self-pay | Admitting: Nurse Practitioner

## 2022-08-06 ENCOUNTER — Telehealth: Payer: Self-pay | Admitting: *Deleted

## 2022-08-06 DIAGNOSIS — C155 Malignant neoplasm of lower third of esophagus: Secondary | ICD-10-CM

## 2022-08-06 NOTE — Telephone Encounter (Signed)
Pt ok with having another MD do procedure. Pt scheduled for EGD in the [facility] 08/14/22 at 10am with Dr. Leone Payor. Pt states her platelets were a little low yesterday and she is having her counts checked on 7/9 to see if she is ok to proceed with EGD. Patient will call back after her labs on the 9th to let us know if she is ok to proceed with EGD.

## 2022-08-06 NOTE — Telephone Encounter (Signed)
Alison Garner My patient with recurrent esophageal cancer getting chemo. [name] has requested EGD and you have a slot We can discuss if needed Thanks for your help [name]

## 2022-08-06 NOTE — Telephone Encounter (Signed)
Okay for EGD before she sees Dr. Orvilla Fus to be done with another provider if okay with patient as I do not see an open endoscopy slot for me prior to her 08/20/2022 oncology appointment

## 2022-08-06 NOTE — Telephone Encounter (Signed)
Called Ms. Islam with CT appointment on 7/9 at 10:00 with lab/flush at 0915.

## 2022-08-07 ENCOUNTER — Inpatient Hospital Stay: Payer: Medicare Other

## 2022-08-07 VITALS — BP 116/54 | HR 93 | Temp 98.1°F | Resp 18

## 2022-08-07 DIAGNOSIS — Z5111 Encounter for antineoplastic chemotherapy: Secondary | ICD-10-CM | POA: Diagnosis not present

## 2022-08-07 DIAGNOSIS — C155 Malignant neoplasm of lower third of esophagus: Secondary | ICD-10-CM

## 2022-08-07 MED ORDER — HEPARIN SOD (PORK) LOCK FLUSH 100 UNIT/ML IV SOLN
500.0000 [IU] | Freq: Once | INTRAVENOUS | Status: AC | PRN
  Administered 2022-08-07: 500 [IU]

## 2022-08-07 MED ORDER — SODIUM CHLORIDE 0.9% FLUSH
10.0000 mL | INTRAVENOUS | Status: DC | PRN
  Administered 2022-08-07: 10 mL

## 2022-08-07 MED ORDER — PEGFILGRASTIM-CBQV 6 MG/0.6ML ~~LOC~~ SOSY
6.0000 mg | PREFILLED_SYRINGE | Freq: Once | SUBCUTANEOUS | Status: AC
  Administered 2022-08-07: 6 mg via SUBCUTANEOUS
  Filled 2022-08-07: qty 0.6

## 2022-08-07 NOTE — Patient Instructions (Signed)

## 2022-08-12 ENCOUNTER — Other Ambulatory Visit: Payer: Medicare Other

## 2022-08-12 ENCOUNTER — Encounter: Payer: Self-pay | Admitting: Oncology

## 2022-08-13 ENCOUNTER — Ambulatory Visit (HOSPITAL_BASED_OUTPATIENT_CLINIC_OR_DEPARTMENT_OTHER)
Admission: RE | Admit: 2022-08-13 | Discharge: 2022-08-13 | Disposition: A | Discharge status: Home / Self Care | Payer: Medicare Other | Source: Ambulatory Visit | Attending: Nurse Practitioner | Admitting: Nurse Practitioner

## 2022-08-13 ENCOUNTER — Encounter (HOSPITAL_BASED_OUTPATIENT_CLINIC_OR_DEPARTMENT_OTHER): Payer: Self-pay

## 2022-08-13 ENCOUNTER — Inpatient Hospital Stay: Payer: Medicare Other

## 2022-08-13 ENCOUNTER — Telehealth: Payer: Self-pay | Admitting: Internal Medicine

## 2022-08-13 DIAGNOSIS — C155 Malignant neoplasm of lower third of esophagus: Secondary | ICD-10-CM

## 2022-08-13 LAB — CBC WITH DIFFERENTIAL (CANCER CENTER ONLY)
Abs Immature Granulocytes: 0.4 10*3/uL — ABNORMAL HIGH (ref 0.00–0.07)
Band Neutrophils: 2 %
Basophils Absolute: 0 10*3/uL (ref 0.0–0.1)
Basophils Relative: 0 %
Eosinophils Absolute: 0 10*3/uL (ref 0.0–0.5)
Eosinophils Relative: 0 %
HCT: 33.2 % — ABNORMAL LOW (ref 36.0–46.0)
Hemoglobin: 11.7 g/dL — ABNORMAL LOW (ref 12.0–15.0)
Lymphocytes Relative: 20 %
Lymphs Abs: 2.8 10*3/uL (ref 0.7–4.0)
MCH: 35.7 pg — ABNORMAL HIGH (ref 26.0–34.0)
MCHC: 35.2 g/dL (ref 30.0–36.0)
MCV: 101.2 fL — ABNORMAL HIGH (ref 80.0–100.0)
Monocytes Absolute: 0.4 10*3/uL (ref 0.1–1.0)
Monocytes Relative: 3 %
Myelocytes: 3 %
Neutro Abs: 10.4 10*3/uL — ABNORMAL HIGH (ref 1.7–7.7)
Neutrophils Relative %: 72 %
Platelet Count: 67 10*3/uL — ABNORMAL LOW (ref 150–400)
RBC: 3.28 MIL/uL — ABNORMAL LOW (ref 3.87–5.11)
RDW: 19.3 % — ABNORMAL HIGH (ref 11.5–15.5)
WBC Count: 14.1 10*3/uL — ABNORMAL HIGH (ref 4.0–10.5)
nRBC: 0 % (ref 0.0–0.2)

## 2022-08-13 LAB — CMP (CANCER CENTER ONLY)
ALT: 18 U/L (ref 0–44)
AST: 20 U/L (ref 15–41)
Albumin: 4.1 g/dL (ref 3.5–5.0)
Alkaline Phosphatase: 176 U/L — ABNORMAL HIGH (ref 38–126)
Anion gap: 7 (ref 5–15)
BUN: 8 mg/dL (ref 8–23)
CO2: 26 mmol/L (ref 22–32)
Calcium: 9.8 mg/dL (ref 8.9–10.3)
Chloride: 99 mmol/L (ref 98–111)
Creatinine: 0.91 mg/dL (ref 0.44–1.00)
GFR, Estimated: >60 mL/min (ref >60)
Glucose, Bld: 118 mg/dL — ABNORMAL HIGH (ref 70–99)
Potassium: 4.2 mmol/L (ref 3.5–5.1)
Sodium: 132 mmol/L — ABNORMAL LOW (ref 135–145)
Total Bilirubin: 0.7 mg/dL (ref 0.3–1.2)
Total Protein: 6.2 g/dL — ABNORMAL LOW (ref 6.5–8.1)

## 2022-08-13 LAB — CEA (ACCESS): CEA (CHCC): 14.83 ng/mL — ABNORMAL HIGH (ref 0.00–5.00)

## 2022-08-13 MED ORDER — HEPARIN SOD (PORK) LOCK FLUSH 100 UNIT/ML IV SOLN
500.0000 [IU] | Freq: Once | INTRAVENOUS | Status: AC
  Administered 2022-08-13: 500 [IU] via INTRAVENOUS

## 2022-08-13 MED ORDER — IOHEXOL 300 MG/ML  SOLN
100.0000 mL | Freq: Once | INTRAMUSCULAR | Status: AC | PRN
  Administered 2022-08-13: 50 mL via INTRAVENOUS

## 2022-08-13 NOTE — Telephone Encounter (Signed)
Pyrtle pt scheduled for EGD tomorrow with Dr. Leone Payor. Pt had labs at the cancer center today to check her platelet count. She is calling to see if she is ok to proceed with EGD. Please advise.

## 2022-08-13 NOTE — Telephone Encounter (Signed)
I have reviewed blood work okay to proceed with EGD

## 2022-08-13 NOTE — Telephone Encounter (Signed)
Spoke with pt and let her know she is ok to proceed with the EGD as scheduled per Dr. Leone Payor. Pt verbalized understanding.

## 2022-08-13 NOTE — Telephone Encounter (Signed)
Inbound call from patient stating her lab work results came in today. Requesting a call back to discuss results to make sure she is okay to continue with procedure tomorrow 7/10. Please advise, thank you.

## 2022-08-14 ENCOUNTER — Ambulatory Visit (AMBULATORY_SURGERY_CENTER): Payer: Medicare Other | Admitting: Internal Medicine

## 2022-08-14 ENCOUNTER — Encounter: Payer: Self-pay | Admitting: Internal Medicine

## 2022-08-14 VITALS — BP 147/54 | HR 76 | Temp 96.6°F | Resp 14 | Ht 65.0 in | Wt 136.0 lb

## 2022-08-14 DIAGNOSIS — C155 Malignant neoplasm of lower third of esophagus: Secondary | ICD-10-CM | POA: Diagnosis present

## 2022-08-14 MED ORDER — SODIUM CHLORIDE 0.9 % IV SOLN
500.0000 mL | INTRAVENOUS | Status: DC
Start: 2022-08-14 — End: 2023-02-12

## 2022-08-14 NOTE — Progress Notes (Signed)
Called to room to assist during endoscopic procedure.  Patient ID and intended procedure confirmed with present staff. Received instructions for my participation in the procedure from the performing physician.  

## 2022-08-14 NOTE — Patient Instructions (Addendum)
I did not se signs of cancer. I did see radiation changes.  I took some biopsies and we will let you and Dr. Truett Perna know.  I appreciate the opportunity to care for you. Iva Boop, MD, [facility]   Discharge instructions given. Biopsies taken. Handout on Hiatal Hernia. Resume previous medications. YOU HAD AN ENDOSCOPIC PROCEDURE TODAY AT THE [facility]:   Refer to the procedure report that was given to you for any specific questions about what was found during the examination.  If the procedure report does not answer your questions, please call your gastroenterologist to clarify.  If you requested that your care partner not be given the details of your procedure findings, then the procedure report has been included in a sealed envelope for you to review at your convenience later.  YOU SHOULD EXPECT: Some feelings of bloating in the abdomen. Passage of more gas than usual.  Walking can help get rid of the air that was put into your GI tract during the procedure and reduce the bloating. If you had a lower endoscopy (such as a colonoscopy or flexible sigmoidoscopy) you may notice spotting of blood in your stool or on the toilet paper. If you underwent a bowel prep for your procedure, you may not have a normal bowel movement for a few days.  Please Note:  You might notice some irritation and congestion in your nose or some drainage.  This is from the oxygen used during your procedure.  There is no need for concern and it should clear up in a day or so.  SYMPTOMS TO REPORT IMMEDIATELY:   Following upper endoscopy (EGD)  Vomiting of blood or coffee ground material  New chest pain or pain under the shoulder blades  Painful or persistently difficult swallowing  New shortness of breath  Fever of 100F or higher  Black, tarry-looking stools  For urgent or emergent issues, a gastroenterologist can be reached at any hour by calling [phone]. Do not use MyChart messaging for  urgent concerns.    DIET:  We do recommend a small meal at first, but then you may proceed to your regular diet.  Drink plenty of fluids but you should avoid alcoholic beverages for 24 hours.  ACTIVITY:  You should plan to take it easy for the rest of today and you should NOT DRIVE or use heavy machinery until tomorrow (because of the sedation medicines used during the test).    FOLLOW UP: Our staff will call the number listed on your records the next business day following your procedure.  We will call around 7:15- 8:00 am to check on you and address any questions or concerns that you may have regarding the information given to you following your procedure. If we do not reach you, we will leave a message.     If any biopsies were taken you will be contacted by phone or by letter within the next 1-3 weeks.  Please call us at [phone] if you have not heard about the biopsies in 3 weeks.    SIGNATURES/CONFIDENTIALITY: You and/or your care partner have signed paperwork which will be entered into your electronic medical record.  These signatures attest to the fact that that the information above on your After Visit Summary has been reviewed and is understood.  Full responsibility of the confidentiality of this discharge information lies with you and/or your care-partner.

## 2022-08-14 NOTE — Progress Notes (Signed)
[facility] History and Physical   Primary Care Physician:  Majel Homer, MD   Reason for Procedure:   Follow-up esophageal cancer  Plan:    EGD     HPI: Alison Garner is a 72 y.o. female for evaluation of esophageal cancer treatment.   Assessment/Plan: Distal esophagus cancer Esophagram 11/21/2020-moderate to severe esophageal dysmotility, narrowing of the distal esophagus just above the GE junction to 4 mm, 13 mm barium tablet stuck in the distal esophagus Upper endoscopy 12/13/2020-severe esophagitis with nodularity and stricturing at 36 cm-adenocarcinoma, HER2 negative, no loss of mismatch repair protein expression, PD-L1 combined positive score less than 1% CT chest 12/14/2020-accentuated density at the distal esophageal lumen above a small type I hiatal hernia concerning for malignancy, nonpathologically enlarged AP window and right hilar nodes PET 12/21/2020-abnormal FDG uptake at the distal esophagus, no evidence of nodal or distant metastatic disease Radiation 01/08/2021-02/16/2021 Cycle 1 weekly Taxol/carboplatin 01/08/2021 Cycle 2 weekly Taxol/carboplatin 01/15/2021 Treatment held 01/22/2021 due to neutropenia Positive COVID test 02/02/2021 Taxol/carboplatin 02/15/2021 Endoscopy 04/30/2021-shallow ulceration at the distal esophagus, nodularity at the gastric cardia-biopsies negative for malignancy CTs 04/02/2022-small mediastinal and right hilar lymph nodes.  Esophagus is mildly patulous decreased from previous in the mid to upper aspect of the mediastinum.  More distally the wall thickening of the esophagus appears slightly decreased.  Small hiatal hernia.  The area of thickening does correspond to the abnormality on PET-CT.  Emphysema. Endoscopy 04/15/2022-Short segment salmon-colored mucosa 1 cm above the Z-line.  Scarring at the gastroesophageal junction/Z-line with nonobstructing narrowing.  Nodular mucosa in the gastric cardia immediately distal to the  Z-line.  Pathology on GE junction nodule-invasive moderately differentiated adenocarcinoma, tumor arises within a background of high-grade dysplasia, mild chronic active gastritis with foveolar hyperplasia, negative for squamous epithelium and intestinal metaplasia.  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  Negative for HER2 by IHC, PD-L1 CPS 2% PET scan 04/25/2022-increased radiotracer uptake associated with the distal esophagus at the level of the GE junction.  No evidence of nodal metastasis or distant metastatic disease. Cycle 1 FOLFOX 06/04/2022 Cycle 2 FOLFOX 06/18/2022, oxaliplatin dose reduced secondary thrombocytopenia Cycle 3 FOLFOX 06/24/2022, oxaliplatin dose reduced secondary thrombocytopenia, 5-FU dose reduced secondary to hand/foot syndrome 07/16/2022 chemotherapy held due to thrombocytopenia, dehydration Cycle 4 FOLFOX 07/23/2022, oxaliplatin dose reduced due to thrombocytopenia Cycle 5 FOLFOX 08/05/2022 Lab Results  Component Value Date   WBC 14.1 (H) 08/13/2022   HGB 11.7 (L) 08/13/2022   HCT 33.2 (L) 08/13/2022   MCV 101.2 (H) 08/13/2022   PLT 67 (L) 08/13/2022    Past Medical History:  Diagnosis Date   Arthritis    Carotid bruit    Diverticulosis    Esophageal cancer (HCC)    External hemorrhoids    Hiatal hernia    History of radiation therapy    Esophagus- 01/08/21-02/16/21- Dr. Antony Blackbird   Hyperlipidemia    Hypothyroidism    Internal hemorrhoids    Irritable bowel syndrome    Kidney infection    UTI hx   Kidney stones    Lichen planus    Macular cyst, hole, or pseudohole, unspecified eye    Thyroid disease    thyroid nodule   Vitamin D deficiency     Past Surgical History:  Procedure Laterality Date   COLONOSCOPY  2017   JMP-MAC-good prep-tics/hems/normal colon   DILATION AND CURETTAGE OF UTERUS  1982   IR IMAGING GUIDED PORT INSERTION  05/17/2022   UPPER GASTROINTESTINAL ENDOSCOPY  12/2020  Prior to Admission medications    Medication Sig Start Date End Date Taking? Authorizing Provider  Cholecalciferol (VITAMIN D3) 50 MCG (2000 UT) CAPS Take 2,000 Units by mouth 2 (two) times daily.   Yes [provider]  ezetimibe-simvastatin (VYTORIN) 10-20 MG tablet Take 1 tablet by mouth every other day.   Yes [provider]  imipramine (TOFRANIL) 25 MG tablet Take 25 mg by mouth at bedtime. Takes 2 tablets in bedtime   Yes [provider]  levothyroxine (SYNTHROID, LEVOTHROID) 25 MCG tablet Take 0.025 mcg by mouth daily before breakfast. 08/02/17  Yes [provider]  loratadine (CLARITIN) 10 MG tablet Take 10 mg by mouth daily. X 5 days after each Neulasta   Yes [provider]  pantoprazole (PROTONIX) 40 MG tablet Take 1 tablet (40 mg total) by mouth 2 (two) times daily before a meal. 06/03/22  Yes Pyrtle, Carie Caddy, MD  potassium chloride SA (KLOR-CON M) 20 MEQ tablet Take 1 tablet (20 mEq total) by mouth daily. 06/04/22  Yes Rana Snare, NP  fluconazole (DIFLUCAN) 100 MG tablet Take 1 tablet (100 mg total) by mouth daily. Patient not taking: Reported on 06/18/2022 06/11/22   Rana Snare, NP  lidocaine-prilocaine (EMLA) cream Apply 1 Application topically as needed (apply to [facility] site 1-2 hours before appt). 05/23/22   Ladene Artist, MD  ondansetron (ZOFRAN) 8 MG tablet Take 1 tablet (8 mg total) by mouth every 8 (eight) hours as needed for nausea or vomiting (May use day 3 after chemo as needed for nausea). Patient not taking: Reported on 06/18/2022 05/23/22   Ladene Artist, MD  prochlorperazine (COMPAZINE) 10 MG tablet Take 1 tablet (10 mg total) by mouth every 6 (six) hours as needed for nausea or vomiting. Patient not taking: Reported on 08/14/2022 05/23/22   Ladene Artist, MD    Current Outpatient Medications  Medication Sig Dispense Refill   Cholecalciferol (VITAMIN D3) 50 MCG (2000 UT) CAPS Take 2,000 Units by mouth 2 (two) times daily.     ezetimibe-simvastatin  (VYTORIN) 10-20 MG tablet Take 1 tablet by mouth every other day.     imipramine (TOFRANIL) 25 MG tablet Take 25 mg by mouth at bedtime. Takes 2 tablets in bedtime     levothyroxine (SYNTHROID, LEVOTHROID) 25 MCG tablet Take 0.025 mcg by mouth daily before breakfast.     loratadine (CLARITIN) 10 MG tablet Take 10 mg by mouth daily. X 5 days after each Neulasta     pantoprazole (PROTONIX) 40 MG tablet Take 1 tablet (40 mg total) by mouth 2 (two) times daily before a meal. 180 tablet 3   potassium chloride SA (KLOR-CON M) 20 MEQ tablet Take 1 tablet (20 mEq total) by mouth daily. 30 tablet 1   fluconazole (DIFLUCAN) 100 MG tablet Take 1 tablet (100 mg total) by mouth daily. (Patient not taking: Reported on 06/18/2022) 4 tablet 0   lidocaine-prilocaine (EMLA) cream Apply 1 Application topically as needed (apply to Port site 1-2 hours before appt). 30 g 0   ondansetron (ZOFRAN) 8 MG tablet Take 1 tablet (8 mg total) by mouth every 8 (eight) hours as needed for nausea or vomiting (May use day 3 after chemo as needed for nausea). (Patient not taking: Reported on 06/18/2022) 20 tablet 0   prochlorperazine (COMPAZINE) 10 MG tablet Take 1 tablet (10 mg total) by mouth every 6 (six) hours as needed for nausea or vomiting. (Patient not taking: Reported on 08/14/2022) 30 tablet  0   Current Facility-Administered Medications  Medication Dose Route Frequency Provider Last Rate Last Admin   0.9 %  sodium chloride infusion  500 mL Intravenous Once Pyrtle, Carie Caddy, MD       0.9 %  sodium chloride infusion  500 mL Intravenous Continuous Iva Boop, MD        Allergies as of 08/14/2022 - Review Complete 08/14/2022  Allergen Reaction Noted   Bactrim [sulfamethoxazole-trimethoprim] Rash 09/07/2015    Family History  Problem Relation Age of Onset   Heart disease Mother    Hypertension Mother    Heart disease Father    Heart failure Father    Thyroid disease Brother    Heart disease Maternal Aunt    Other  Maternal Aunt        MDS   Heart disease Maternal Uncle    Heart failure Maternal Uncle    Myasthenia gravis Paternal Aunt    Heart disease Paternal Uncle    Heart failure Paternal Uncle    Colon cancer Neg Hx    Esophageal cancer Neg Hx    Stomach cancer Neg Hx    Rectal cancer Neg Hx    Colon polyps Neg Hx     Social History   Socioeconomic History   Marital status: Single    Spouse name: Not on file   Number of children: Not on file   Years of education: Not on file   Highest education level: Not on file  Occupational History   Not on file  Tobacco Use   Smoking status: Every Day    Packs/day: 0.50    Years: 50.00    Additional pack years: 0.00    Total pack years: 25.00    Types: Cigarettes   Smokeless tobacco: Never  Vaping Use   Vaping Use: Never used  Substance and Sexual Activity   Alcohol use: Yes    Comment: rare   Drug use: No   Sexual activity: Not on file  Other Topics Concern   Not on file  Social History Narrative   Not on file   Social Determinants of Health   Financial Resource Strain: Not on file  Food Insecurity: No Food Insecurity (06/04/2022)   Hunger Vital Sign    Worried About Running Out of Food in the Last Year: Never true    Ran Out of Food in the Last Year: Never true  Transportation Needs: No Transportation Needs (06/04/2022)   PRAPARE - Administrator, Civil Service (Medical): No    Lack of Transportation (Non-Medical): No  Physical Activity: Not on file  Stress: Not on file  Social Connections: Not on file  Intimate Partner Violence: Not At Risk (06/04/2022)   Humiliation, Afraid, Rape, and Kick questionnaire    Fear of Current or Ex-Partner: No    Emotionally Abused: No    Physically Abused: No    Sexually Abused: No    Review of Systems:  All other review of systems negative except as mentioned in the HPI.  Physical Exam: Vital signs BP (!) 150/58   Pulse 90   Temp (!) 96.6 F (35.9 C)   Resp 14   Ht  5\' 5"  (1.651 m)   Wt 136 lb (61.7 kg)   SpO2 100%   BMI 22.63 kg/m   General:   Alert,  Well-developed, well-nourished, pleasant and cooperative in NAD Lungs:  Clear throughout to auscultation.   Heart:  Regular rate and rhythm; no murmurs,  clicks, rubs,  or gallops. Abdomen:  Soft, nontender and nondistended. Normal bowel sounds.   Neuro/Psych:  Alert and cooperative. Normal mood and affect. A and O x 3   @Marik Sedore  Sena Slate, MD, [facility] [phone] (pager) 08/14/2022 10:12 AM@

## 2022-08-14 NOTE — Op Note (Signed)
[facility] Patient Name: Alison Garner Procedure Date: 08/14/2022 10:08 AM MRN: [id number] Endoscopist: Iva Boop , MD, [id number] Age: 72 Referring MD:  Date of Birth: [birth year] Gender: Female Account #: [id number] Procedure:                Upper GI endoscopy Indications:              Malignant esophageal adenocarcinoma, Follow-up of                            malignant esophageal adenocarcinoma Medicines:                Monitored Anesthesia Care Procedure:                Pre-Anesthesia Assessment:                           - Prior to the procedure, a History and Physical                            was performed, and patient medications and                            allergies were reviewed. The patient's tolerance of                            previous anesthesia was also reviewed. The risks                            and benefits of the procedure and the sedation                            options and risks were discussed with the patient.                            All questions were answered, and informed consent                            was obtained. Prior Anticoagulants: The patient has                            taken no anticoagulant or antiplatelet agents. ASA                            Grade Assessment: III - A patient with severe                            systemic disease. After reviewing the risks and                            benefits, the patient was deemed in satisfactory                            condition to undergo the procedure.  After obtaining informed consent, the endoscope was                            passed under direct vision. Throughout the                            procedure, the patient's blood pressure, pulse, and                            oxygen saturations were monitored continuously. The                            Olympus Scope [id number] was introduced through the                            mouth, and  advanced to the second part of duodenum.                            The upper GI endoscopy was accomplished without                            difficulty. The patient tolerated the procedure                            well. Scope In: Scope Out: Findings:                 One benign-appearing, intrinsic mild                            (non-circumferential scarring) stenosis was found                            in the distal esophagus. This stenosis measured 4                            cm (in length). The stenosis was traversed.                            Biopsies were taken with a cold forceps for                            histology. Verification of patient identification                            for the specimen was done. Estimated blood loss was                            minimal.                           A small hiatal hernia was present.                           The exam was otherwise without abnormality.  The cardia and gastric fundus were normal on                            retroflexion.                           The gastroesophageal flap valve was visualized                            endoscopically and classified as Hill Grade IV (no                            fold, wide open lumen, hiatal hernia present). Complications:            No immediate complications. Estimated Blood Loss:     Estimated blood loss was minimal. Impression:               - Benign-appearing esophageal stenosis. Biopsied.                            Radiation changes in distal esophagus - scope                            passes w/ some pressure felt but no trauma. Looks                            like radiation changes. Sampled to look for                            underlying cancer.                           - Small hiatal hernia.                           - The examination was otherwise normal.                           - Gastroesophageal flap valve classified as Hill                             Grade IV (no fold, wide open lumen, hiatal hernia                            present). Recommendation:           - Patient has a contact number available for                            emergencies. The signs and symptoms of potential                            delayed complications were discussed with the                            patient. Return to normal activities tomorrow.  Written discharge instructions were provided to the                            patient.                           - Resume previous diet.                           - Continue present medications.                           - Await pathology results. Iva Boop, MD 08/14/2022 10:33:47 AM This report has been signed electronically.

## 2022-08-14 NOTE — Progress Notes (Signed)
To pacu, VSS. Report to [email or web address] 

## 2022-08-15 ENCOUNTER — Telehealth: Payer: Self-pay | Admitting: *Deleted

## 2022-08-15 NOTE — Telephone Encounter (Signed)
  Follow up Call-     08/14/2022    9:20 AM 04/15/2022    9:42 AM 04/30/2021    9:32 AM 12/13/2020   11:11 AM  Call back number  Post procedure Call Back phone  # [phone]  Permission to leave phone message Yes Yes Yes Yes     Patient questions:  Do you have a fever, pain , or abdominal swelling? No. Pain Score  0 *  Have you tolerated food without any problems? Yes.    Have you been able to return to your normal activities? Yes.    Do you have any questions about your discharge instructions: Diet   No. Medications  No. Follow up visit  No.  Do you have questions or concerns about your Care? No.  Actions: * If pain score is 4 or above: No action needed, pain <4.

## 2022-08-18 ENCOUNTER — Other Ambulatory Visit: Payer: Self-pay | Admitting: Oncology

## 2022-08-18 DIAGNOSIS — C155 Malignant neoplasm of lower third of esophagus: Secondary | ICD-10-CM

## 2022-08-19 ENCOUNTER — Other Ambulatory Visit: Payer: Medicare Other

## 2022-08-19 ENCOUNTER — Ambulatory Visit: Payer: Medicare Other | Admitting: Nurse Practitioner

## 2022-08-19 ENCOUNTER — Ambulatory Visit: Payer: Medicare Other

## 2022-08-20 ENCOUNTER — Inpatient Hospital Stay (HOSPITAL_BASED_OUTPATIENT_CLINIC_OR_DEPARTMENT_OTHER): Payer: Medicare Other | Admitting: Oncology

## 2022-08-20 ENCOUNTER — Inpatient Hospital Stay: Payer: Medicare Other

## 2022-08-20 ENCOUNTER — Other Ambulatory Visit: Payer: Medicare Other

## 2022-08-20 ENCOUNTER — Ambulatory Visit: Payer: Medicare Other

## 2022-08-20 ENCOUNTER — Ambulatory Visit: Payer: Medicare Other | Admitting: Oncology

## 2022-08-20 VITALS — BP 110/70 | HR 86 | Temp 98.1°F | Resp 18 | Ht 65.0 in | Wt 137.7 lb

## 2022-08-20 DIAGNOSIS — C155 Malignant neoplasm of lower third of esophagus: Secondary | ICD-10-CM

## 2022-08-20 DIAGNOSIS — Z5111 Encounter for antineoplastic chemotherapy: Secondary | ICD-10-CM | POA: Diagnosis not present

## 2022-08-20 LAB — CMP (CANCER CENTER ONLY)
ALT: 15 U/L (ref 0–44)
AST: 18 U/L (ref 15–41)
Albumin: 4 g/dL (ref 3.5–5.0)
Alkaline Phosphatase: 129 U/L — ABNORMAL HIGH (ref 38–126)
Anion gap: 7 (ref 5–15)
BUN: 5 mg/dL — ABNORMAL LOW (ref 8–23)
CO2: 27 mmol/L (ref 22–32)
Calcium: 9.4 mg/dL (ref 8.9–10.3)
Chloride: 101 mmol/L (ref 98–111)
Creatinine: 0.91 mg/dL (ref 0.44–1.00)
GFR, Estimated: >60 mL/min (ref >60)
Glucose, Bld: 152 mg/dL — ABNORMAL HIGH (ref 70–99)
Potassium: 3.7 mmol/L (ref 3.5–5.1)
Sodium: 135 mmol/L (ref 135–145)
Total Bilirubin: 0.7 mg/dL (ref 0.3–1.2)
Total Protein: 6.2 g/dL — ABNORMAL LOW (ref 6.5–8.1)

## 2022-08-20 LAB — CBC WITH DIFFERENTIAL (CANCER CENTER ONLY)
Abs Immature Granulocytes: 0.09 10*3/uL — ABNORMAL HIGH (ref 0.00–0.07)
Basophils Absolute: 0 10*3/uL (ref 0.0–0.1)
Basophils Relative: 0 %
Eosinophils Absolute: 0.2 10*3/uL (ref 0.0–0.5)
Eosinophils Relative: 2 %
HCT: 36 % (ref 36.0–46.0)
Hemoglobin: 12.4 g/dL (ref 12.0–15.0)
Immature Granulocytes: 1 %
Lymphocytes Relative: 17 %
Lymphs Abs: 1.3 10*3/uL (ref 0.7–4.0)
MCH: 35.8 pg — ABNORMAL HIGH (ref 26.0–34.0)
MCHC: 34.4 g/dL (ref 30.0–36.0)
MCV: 104 fL — ABNORMAL HIGH (ref 80.0–100.0)
Monocytes Absolute: 0.6 10*3/uL (ref 0.1–1.0)
Monocytes Relative: 8 %
Neutro Abs: 5.2 10*3/uL (ref 1.7–7.7)
Neutrophils Relative %: 72 %
Platelet Count: 74 10*3/uL — ABNORMAL LOW (ref 150–400)
RBC: 3.46 MIL/uL — ABNORMAL LOW (ref 3.87–5.11)
RDW: 20.2 % — ABNORMAL HIGH (ref 11.5–15.5)
WBC Count: 7.4 10*3/uL (ref 4.0–10.5)
nRBC: 0 % (ref 0.0–0.2)

## 2022-08-20 LAB — CEA (ACCESS): CEA (CHCC): 13.68 ng/mL — ABNORMAL HIGH (ref 0.00–5.00)

## 2022-08-20 NOTE — Progress Notes (Signed)
[facility] OFFICE PROGRESS NOTE   Diagnosis: Esophagus cancer  INTERVAL HISTORY:   Alison Garner returns as scheduled.  She completed another cycle of FOLFOX on 08/05/2022.  She received G-CSF on 08/07/2022.  No nausea/vomiting, mouth sores, or diarrhea.  She reports mild tingling and numbness in the toes.  No dysphagia. She underwent an upper endoscopy 08/14/2022.  A benign-appearing stenosis was found in the distal esophagus.  The gastric cardia and fundus appeared normal.  Objective:  Vital signs in last 24 hours:  Blood pressure 110/70, pulse 86, temperature 98.1 F (36.7 C), temperature source Oral, resp. rate 18, height 5\' 5"  (1.651 m), weight 137 lb 11.2 oz (62.5 kg), SpO2 100%.    HEENT: No thrush or ulcers Lymphatics: No cervical, supraclavicular, or axillary nodes Resp: Lungs clear bilaterally Cardio: Regular rate and rhythm GI: No hepatosplenomegaly Vascular: No leg edema Neuro: Moderate loss of vibratory sense at the fingertips bilaterally  Portacath/PICC-without erythema  Lab Results:  Lab Results  Component Value Date   WBC 7.4 08/20/2022   HGB 12.4 08/20/2022   HCT 36.0 08/20/2022   MCV 104.0 (H) 08/20/2022   PLT 74 (L) 08/20/2022   NEUTROABS 5.2 08/20/2022    CMP  Lab Results  Component Value Date   NA 135 08/20/2022   K 3.7 08/20/2022   CL 101 08/20/2022   CO2 27 08/20/2022   GLUCOSE 152 (H) 08/20/2022   BUN 5 (L) 08/20/2022   CREATININE 0.91 08/20/2022   CALCIUM 9.4 08/20/2022   PROT 6.2 (L) 08/20/2022   ALBUMIN 4.0 08/20/2022   AST 18 08/20/2022   ALT 15 08/20/2022   ALKPHOS 129 (H) 08/20/2022   BILITOT 0.7 08/20/2022   GFRNONAA >60 08/20/2022    Lab Results  Component Value Date   CEA 13.68 (H) 08/20/2022    Imaging:   Medications: I have reviewed the patient's current medications.   Assessment/Plan: Distal esophagus cancer Esophagram 11/21/2020-moderate to severe esophageal dysmotility, narrowing of the distal  esophagus just above the GE junction to 4 mm, 13 mm barium tablet stuck in the distal esophagus Upper endoscopy 12/13/2020-severe esophagitis with nodularity and stricturing at 36 cm-adenocarcinoma, HER2 negative, no loss of mismatch repair protein expression, PD-L1 combined positive score less than 1% CT chest 12/14/2020-accentuated density at the distal esophageal lumen above a small type I hiatal hernia concerning for malignancy, nonpathologically enlarged AP window and right hilar nodes PET 12/21/2020-abnormal FDG uptake at the distal esophagus, no evidence of nodal or distant metastatic disease Radiation 01/08/2021-02/16/2021 Cycle 1 weekly Taxol/carboplatin 01/08/2021 Cycle 2 weekly Taxol/carboplatin 01/15/2021 Treatment held 01/22/2021 due to neutropenia Positive COVID test 02/02/2021 Taxol/carboplatin 02/15/2021 Endoscopy 04/30/2021-shallow ulceration at the distal esophagus, nodularity at the gastric cardia-biopsies negative for malignancy CTs 04/02/2022-small mediastinal and right hilar lymph nodes.  Esophagus is mildly patulous decreased from previous in the mid to upper aspect of the mediastinum.  More distally the wall thickening of the esophagus appears slightly decreased.  Small hiatal hernia.  The area of thickening does correspond to the abnormality on PET-CT.  Emphysema. Endoscopy 04/15/2022-Short segment salmon-colored mucosa 1 cm above the Z-line.  Scarring at the gastroesophageal junction/Z-line with nonobstructing narrowing.  Nodular mucosa in the gastric cardia immediately distal to the Z-line.  Pathology on GE junction nodule-invasive moderately differentiated adenocarcinoma, tumor arises within a background of high-grade dysplasia, mild chronic active gastritis with foveolar hyperplasia, negative for squamous epithelium and intestinal metaplasia.  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  Negative for HER2 by IHC,  PD-L1 CPS 2% PET scan 04/25/2022-increased  radiotracer uptake associated with the distal esophagus at the level of the GE junction.  No evidence of nodal metastasis or distant metastatic disease. Cycle 1 FOLFOX 06/04/2022 Cycle 2 FOLFOX 06/18/2022, oxaliplatin dose reduced secondary thrombocytopenia Cycle 3 FOLFOX 06/24/2022, oxaliplatin dose reduced secondary thrombocytopenia, 5-FU dose reduced secondary to hand/foot syndrome 07/16/2022 chemotherapy held due to thrombocytopenia, dehydration Cycle 4 FOLFOX 07/23/2022, oxaliplatin dose reduced due to thrombocytopenia Cycle 5 FOLFOX 08/05/2022 CT chest 08/13/2022-mild focal wall thickening at the distal esophagus, 8 mm gastropathic node-not FDG avid on PET, unchanged small mediastinal nodes no evidence of metastatic disease in the chest Upper endoscopy 08/14/2022-benign-appearing mild stenosis in the distal esophagus-microscopic focus of adenocarcinoma Solid dysphagia secondary #1, resolved COPD on chest CT 12/14/2020 Hemorrhoids Hyperlipidemia Irritable bowel syndrome Hypothyroidism Tobacco use Thrombocytopenia secondary to chemotherapy Hand/foot syndrome-5-FU dose reduced with cycle 3 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  She has tolerated the chemotherapy well, though she has developed neurologic toxicity.  The restaging endoscopy and chest CT revealed no evidence of disease progression.  A microscopic focus of adenocarcinoma remains at the distal esophagus.  I discussed treatment options with Alison Garner and her daughter.  I recommend continuing FOLFOX for several more cycles.  We can then consider switching to a maintenance regimen.  She is in agreement.  She plans a family vacation next week.  She will return for an office visit in the next cycle of FOLFOX on 09/04/2022.  I reviewed the CT findings and images with Alison Garner her daughter.  The gastropathic node is most likely benign,  but could represent a metastasis.  Thornton Papas, MD  08/20/2022  3:07 PM

## 2022-08-21 ENCOUNTER — Other Ambulatory Visit: Payer: Self-pay

## 2022-09-01 ENCOUNTER — Other Ambulatory Visit: Payer: Self-pay | Admitting: Oncology

## 2022-09-01 DIAGNOSIS — C155 Malignant neoplasm of lower third of esophagus: Secondary | ICD-10-CM

## 2022-09-03 ENCOUNTER — Ambulatory Visit: Payer: Medicare Other | Admitting: Nurse Practitioner

## 2022-09-03 ENCOUNTER — Other Ambulatory Visit: Payer: Medicare Other

## 2022-09-03 ENCOUNTER — Ambulatory Visit: Payer: Medicare Other

## 2022-09-04 ENCOUNTER — Inpatient Hospital Stay (HOSPITAL_BASED_OUTPATIENT_CLINIC_OR_DEPARTMENT_OTHER): Payer: Medicare Other | Admitting: Nurse Practitioner

## 2022-09-04 ENCOUNTER — Inpatient Hospital Stay: Payer: Medicare Other

## 2022-09-04 ENCOUNTER — Encounter: Payer: Self-pay | Admitting: Nurse Practitioner

## 2022-09-04 VITALS — BP 163/62 | HR 72

## 2022-09-04 VITALS — BP 138/79 | HR 88 | Temp 98.1°F | Resp 18 | Ht 65.0 in | Wt 136.0 lb

## 2022-09-04 DIAGNOSIS — C155 Malignant neoplasm of lower third of esophagus: Secondary | ICD-10-CM

## 2022-09-04 DIAGNOSIS — Z5111 Encounter for antineoplastic chemotherapy: Secondary | ICD-10-CM | POA: Diagnosis not present

## 2022-09-04 LAB — CMP (CANCER CENTER ONLY)
ALT: 12 U/L (ref 0–44)
AST: 19 U/L (ref 15–41)
Albumin: 4 g/dL (ref 3.5–5.0)
Alkaline Phosphatase: 93 U/L (ref 38–126)
Anion gap: 6 (ref 5–15)
BUN: 7 mg/dL — ABNORMAL LOW (ref 8–23)
CO2: 28 mmol/L (ref 22–32)
Calcium: 9.5 mg/dL (ref 8.9–10.3)
Chloride: 102 mmol/L (ref 98–111)
Creatinine: 0.93 mg/dL (ref 0.44–1.00)
GFR, Estimated: >60 mL/min (ref >60)
Glucose, Bld: 91 mg/dL (ref 70–99)
Potassium: 3.8 mmol/L (ref 3.5–5.1)
Sodium: 136 mmol/L (ref 135–145)
Total Bilirubin: 0.5 mg/dL (ref 0.3–1.2)
Total Protein: 6.6 g/dL (ref 6.5–8.1)

## 2022-09-04 LAB — CBC WITH DIFFERENTIAL (CANCER CENTER ONLY)
Abs Immature Granulocytes: 0.02 10*3/uL (ref 0.00–0.07)
Basophils Absolute: 0 10*3/uL (ref 0.0–0.1)
Basophils Relative: 1 %
Eosinophils Absolute: 0.1 10*3/uL (ref 0.0–0.5)
Eosinophils Relative: 2 %
HCT: 37.6 % (ref 36.0–46.0)
Hemoglobin: 13 g/dL (ref 12.0–15.0)
Immature Granulocytes: 1 %
Lymphocytes Relative: 25 %
Lymphs Abs: 1 10*3/uL (ref 0.7–4.0)
MCH: 36.1 pg — ABNORMAL HIGH (ref 26.0–34.0)
MCHC: 34.6 g/dL (ref 30.0–36.0)
MCV: 104.4 fL — ABNORMAL HIGH (ref 80.0–100.0)
Monocytes Absolute: 0.6 10*3/uL (ref 0.1–1.0)
Monocytes Relative: 14 %
Neutro Abs: 2.3 10*3/uL (ref 1.7–7.7)
Neutrophils Relative %: 57 %
Platelet Count: 147 10*3/uL — ABNORMAL LOW (ref 150–400)
RBC: 3.6 MIL/uL — ABNORMAL LOW (ref 3.87–5.11)
RDW: 15.8 % — ABNORMAL HIGH (ref 11.5–15.5)
WBC Count: 4 10*3/uL (ref 4.0–10.5)
nRBC: 0 % (ref 0.0–0.2)

## 2022-09-04 MED ORDER — OXALIPLATIN CHEMO INJECTION 100 MG/20ML
35.0000 mg/m2 | Freq: Once | INTRAVENOUS | Status: AC
  Administered 2022-09-04: 60 mg via INTRAVENOUS
  Filled 2022-09-04: qty 12

## 2022-09-04 MED ORDER — FLUOROURACIL CHEMO INJECTION 500 MG/10ML
300.0000 mg/m2 | Freq: Once | INTRAVENOUS | Status: AC
  Administered 2022-09-04: 500 mg via INTRAVENOUS
  Filled 2022-09-04: qty 10

## 2022-09-04 MED ORDER — LEUCOVORIN CALCIUM INJECTION 350 MG
300.0000 mg/m2 | Freq: Once | INTRAVENOUS | Status: AC
  Administered 2022-09-04: 516 mg via INTRAVENOUS
  Filled 2022-09-04: qty 25.8

## 2022-09-04 MED ORDER — LIDOCAINE-PRILOCAINE 2.5-2.5 % EX CREA: 1.0000 | TOPICAL_CREAM | CUTANEOUS | 3 refills | Status: DC | PRN

## 2022-09-04 MED ORDER — SODIUM CHLORIDE 0.9 % IV SOLN
1800.0000 mg/m2 | INTRAVENOUS | Status: DC
  Administered 2022-09-04: 3100 mg via INTRAVENOUS
  Filled 2022-09-04: qty 62

## 2022-09-04 MED ORDER — DEXTROSE 5 % IV SOLN: Freq: Once | INTRAVENOUS | Status: AC

## 2022-09-04 MED ORDER — PALONOSETRON HCL INJECTION 0.25 MG/5ML
0.2500 mg | Freq: Once | INTRAVENOUS | Status: AC
  Administered 2022-09-04: 0.25 mg via INTRAVENOUS
  Filled 2022-09-04: qty 5

## 2022-09-04 MED ORDER — SODIUM CHLORIDE 0.9 % IV SOLN
10.0000 mg | Freq: Once | INTRAVENOUS | Status: AC
  Administered 2022-09-04: 10 mg via INTRAVENOUS
  Filled 2022-09-04: qty 10

## 2022-09-04 NOTE — Patient Instructions (Addendum)
[place] [facility]   The chemotherapy medication bag should finish at 46 hours, 96 hours, or 7 days. For example, if your pump is scheduled for 46 hours and it was put on at 4:00 p.m., it should finish at 2:00 p.m. the day it is scheduled to come off regardless of your appointment time.     Estimated time to finish at 10:45am Friday August 2nd, 2024.   If the display on your pump reads "Low Volume" and it is beeping, take the batteries out of the pump and come to the cancer center for it to be taken off.   If the pump alarms go off prior to the pump reading "Low Volume" then call [phone] and someone can assist you.  If the plunger comes out and the chemotherapy medication is leaking out, please use your home chemo spill kit to clean up the spill. Do NOT use paper towels or other household products.  If you have problems or questions regarding your pump, please call either [phone] (24 hours a day) or the cancer center Monday-Friday 8:00 a.m.- 4:30 p.m. at the clinic number and we will assist you. If you are unable to get assistance, then go to the nearest Emergency Department and ask the staff to contact the IV team for assistance.  Discharge Instructions: Thank you for choosing [facility] to provide your oncology and hematology care.   If you have a lab appointment with the Cancer Center, please go directly to the Cancer Center and check in at the registration area.   Wear comfortable clothing and clothing appropriate for easy access to any Portacath or PICC line.   We strive to give you quality time with your provider. You may need to reschedule your appointment if you arrive late (15 or more minutes).  Arriving late affects you and other patients whose appointments are after yours.  Also, if you miss three or more appointments without notifying the office, you may be dismissed from the clinic at the provider's discretion.      For  prescription refill requests, have your pharmacy contact our office and allow 72 hours for refills to be completed.    Today you received the following chemotherapy and/or immunotherapy agents Oxaliplatin, Leucovorin, Fluorouracil      To help prevent nausea and vomiting after your treatment, we encourage you to take your nausea medication as directed.  BELOW ARE SYMPTOMS THAT SHOULD BE REPORTED IMMEDIATELY: *FEVER GREATER THAN 100.4 F (38 C) OR HIGHER *CHILLS OR SWEATING *NAUSEA AND VOMITING THAT IS NOT CONTROLLED WITH YOUR NAUSEA MEDICATION *UNUSUAL SHORTNESS OF BREATH *UNUSUAL BRUISING OR BLEEDING *URINARY PROBLEMS (pain or burning when urinating, or frequent urination) *BOWEL PROBLEMS (unusual diarrhea, constipation, pain near the anus) TENDERNESS IN MOUTH AND THROAT WITH OR WITHOUT PRESENCE OF ULCERS (sore throat, sores in mouth, or a toothache) UNUSUAL RASH, SWELLING OR PAIN  UNUSUAL VAGINAL DISCHARGE OR ITCHING   Items with * indicate a potential emergency and should be followed up as soon as possible or go to the Emergency Department if any problems should occur.  Please show the CHEMOTHERAPY ALERT CARD or IMMUNOTHERAPY ALERT CARD at check-in to the Emergency Department and triage nurse.  Should you have questions after your visit or need to cancel or reschedule your appointment, please contact [place] [facility]  Dept: [phone]  and follow the prompts.  Office hours are 8:00 a.m. to 4:30 p.m. Monday - Friday. Please note  that voicemails left after 4:00 p.m. may not be returned until the following business day.  We are closed weekends and major holidays. You have access to a nurse at all times for urgent questions. Please call the main number to the clinic Dept: [phone] and follow the prompts.   For any non-urgent questions, you may also contact your provider using MyChart. We now offer e-Visits for anyone 1 and older to request care online for  non-urgent symptoms. For details visit [email or web address].   Also download the MyChart app! Go to the app store, search "MyChart", open the app, select [place], and log in with your MyChart username and password.  Oxaliplatin Injection What is this medication? OXALIPLATIN (ox AL i PLA tin) treats colorectal cancer. It works by slowing down the growth of cancer cells. This medicine may be used for other purposes; ask your health care provider or pharmacist if you have questions. COMMON BRAND NAME(S): Eloxatin What should I tell my care team before I take this medication? They need to know if you have any of these conditions: Heart disease History of irregular heartbeat or rhythm Liver disease Low blood cell levels (white cells, red cells, and platelets) Lung or breathing disease, such as asthma Take medications that treat or prevent blood clots Tingling of the fingers, toes, or other nerve disorder An unusual or allergic reaction to oxaliplatin, other medications, foods, dyes, or preservatives If you or your partner are pregnant or trying to get pregnant Breast-feeding How should I use this medication? This medication is injected into a vein. It is given by your care team in a hospital or clinic setting. Talk to your care team about the use of this medication in children. Special care may be needed. Overdosage: If you think you have taken too much of this medicine contact a poison control center or emergency room at once. NOTE: This medicine is only for you. Do not share this medicine with others. What if I miss a dose? Keep appointments for follow-up doses. It is important not to miss a dose. Call your care team if you are unable to keep an appointment. What may interact with this medication? Do not take this medication with any of the following: Cisapride Dronedarone Pimozide Thioridazine This medication may also interact with the following: Aspirin and aspirin-like  medications Certain medications that treat or prevent blood clots, such as warfarin, apixaban, dabigatran, and rivaroxaban Cisplatin Cyclosporine Diuretics Medications for infection, such as acyclovir, adefovir, amphotericin B, bacitracin, cidofovir, foscarnet, ganciclovir, gentamicin, pentamidine, vancomycin NSAIDs, medications for pain and inflammation, such as ibuprofen or naproxen Other medications that cause heart rhythm changes Pamidronate Zoledronic acid This list may not describe all possible interactions. Give your health care provider a list of all the medicines, herbs, non-prescription drugs, or dietary supplements you use. Also tell them if you smoke, drink alcohol, or use illegal drugs. Some items may interact with your medicine. What should I watch for while using this medication? Your condition will be monitored carefully while you are receiving this medication. You may need blood work while taking this medication. This medication may make you feel generally unwell. This is not uncommon as chemotherapy can affect healthy cells as well as cancer cells. Report any side effects. Continue your course of treatment even though you feel ill unless your care team tells you to stop. This medication may increase your risk of getting an infection. Call your care team for advice if you get a fever, chills, sore throat, or  other symptoms of a cold or flu. Do not treat yourself. Try to avoid being around people who are sick. Avoid taking medications that contain aspirin, acetaminophen, ibuprofen, naproxen, or ketoprofen unless instructed by your care team. These medications may hide a fever. Be careful brushing or flossing your teeth or using a toothpick because you may get an infection or bleed more easily. If you have any dental work done, tell your dentist you are receiving this medication. This medication can make you more sensitive to cold. Do not drink cold drinks or use ice. Cover exposed  skin before coming in contact with cold temperatures or cold objects. When out in cold weather wear warm clothing and cover your mouth and nose to warm the air that goes into your lungs. Tell your care team if you get sensitive to the cold. Talk to your care team if you or your partner are pregnant or think either of you might be pregnant. This medication can cause serious birth defects if taken during pregnancy and for 9 months after the last dose. A negative pregnancy test is required before starting this medication. A reliable form of contraception is recommended while taking this medication and for 9 months after the last dose. Talk to your care team about effective forms of contraception. Do not father a child while taking this medication and for 6 months after the last dose. Use a condom while having sex during this time period. Do not breastfeed while taking this medication and for 3 months after the last dose. This medication may cause infertility. Talk to your care team if you are concerned about your fertility. What side effects may I notice from receiving this medication? Side effects that you should report to your care team as soon as possible: Allergic reactions--skin rash, itching, hives, swelling of the face, lips, tongue, or throat Bleeding--bloody or black, tar-like stools, vomiting blood or brown material that looks like coffee grounds, red or dark brown urine, small red or purple spots on skin, unusual bruising or bleeding Dry cough, shortness of breath or trouble breathing Heart rhythm changes--fast or irregular heartbeat, dizziness, feeling faint or lightheaded, chest pain, trouble breathing Infection--fever, chills, cough, sore throat, wounds that don't heal, pain or trouble when passing urine, general feeling of discomfort or being unwell Liver injury--right upper belly pain, loss of appetite, nausea, light-colored stool, dark yellow or brown urine, yellowing skin or eyes, unusual  weakness or fatigue Low red blood cell level--unusual weakness or fatigue, dizziness, headache, trouble breathing Muscle injury--unusual weakness or fatigue, muscle pain, dark yellow or brown urine, decrease in amount of urine Pain, tingling, or numbness in the hands or feet Sudden and severe headache, confusion, change in vision, seizures, which may be signs of posterior reversible encephalopathy syndrome (PRES) Unusual bruising or bleeding Side effects that usually do not require medical attention (report to your care team if they continue or are bothersome): Diarrhea Nausea Pain, redness, or swelling with sores inside the mouth or throat Unusual weakness or fatigue Vomiting This list may not describe all possible side effects. Call your doctor for medical advice about side effects. You may report side effects to FDA at 1-800-FDA-1088. Where should I keep my medication? This medication is given in a hospital or clinic. It will not be stored at home. NOTE: This sheet is a summary. It may not cover all possible information. If you have questions about this medicine, talk to your doctor, pharmacist, or health care provider.  2024 Elsevier/Gold Standard (  2022-03-31 00:00:00) Leucovorin Injection What is this medication? LEUCOVORIN (loo koe VOR in) prevents side effects from certain medications, such as methotrexate. It works by increasing folate levels. This helps protect healthy cells in your body. It may also be used to treat anemia caused by low levels of folate. It can also be used with fluorouracil, a type of chemotherapy, to treat colorectal cancer. It works by increasing the effects of fluorouracil in the body. This medicine may be used for other purposes; ask your health care provider or pharmacist if you have questions. What should I tell my care team before I take this medication? They need to know if you have any of these conditions: Anemia from low levels of vitamin B12 in the  blood An unusual or allergic reaction to leucovorin, folic acid, other medications, foods, dyes, or preservatives Pregnant or trying to get pregnant Breastfeeding How should I use this medication? This medication is injected into a vein or a muscle. It is given by your care team in a hospital or clinic setting. Talk to your care team about the use of this medication in children. Special care may be needed. Overdosage: If you think you have taken too much of this medicine contact a poison control center or emergency room at once. NOTE: This medicine is only for you. Do not share this medicine with others. What if I miss a dose? Keep appointments for follow-up doses. It is important not to miss your dose. Call your care team if you are unable to keep an appointment. What may interact with this medication? Capecitabine Fluorouracil Phenobarbital Phenytoin Primidone Trimethoprim;sulfamethoxazole This list may not describe all possible interactions. Give your health care provider a list of all the medicines, herbs, non-prescription drugs, or dietary supplements you use. Also tell them if you smoke, drink alcohol, or use illegal drugs. Some items may interact with your medicine. What should I watch for while using this medication? Your condition will be monitored carefully while you are receiving this medication. This medication may increase the side effects of 5-fluorouracil. Tell your care team if you have diarrhea or mouth sores that do not get better or that get worse. What side effects may I notice from receiving this medication? Side effects that you should report to your care team as soon as possible: Allergic reactions--skin rash, itching, hives, swelling of the face, lips, tongue, or throat This list may not describe all possible side effects. Call your doctor for medical advice about side effects. You may report side effects to FDA at 1-800-FDA-1088. Where should I keep my  medication? This medication is given in a hospital or clinic. It will not be stored at home. NOTE: This sheet is a summary. It may not cover all possible information. If you have questions about this medicine, talk to your doctor, pharmacist, or health care provider.  2024 Elsevier/Gold Standard (2021-06-26 00:00:00) Fluorouracil Injection What is this medication? FLUOROURACIL (flure oh YOOR a sil) treats some types of cancer. It works by slowing down the growth of cancer cells. This medicine may be used for other purposes; ask your health care provider or pharmacist if you have questions. COMMON BRAND NAME(S): Adrucil What should I tell my care team before I take this medication? They need to know if you have any of these conditions: Blood disorders Dihydropyrimidine dehydrogenase (DPD) deficiency Infection, such as chickenpox, cold sores, herpes Kidney disease Liver disease Poor nutrition Recent or ongoing radiation therapy An unusual or allergic reaction to fluorouracil, other  medications, foods, dyes, or preservatives If you or your partner are pregnant or trying to get pregnant Breast-feeding How should I use this medication? This medication is injected into a vein. It is administered by your care team in a hospital or clinic setting. Talk to your care team about the use of this medication in children. Special care may be needed. Overdosage: If you think you have taken too much of this medicine contact a poison control center or emergency room at once. NOTE: This medicine is only for you. Do not share this medicine with others. What if I miss a dose? Keep appointments for follow-up doses. It is important not to miss your dose. Call your care team if you are unable to keep an appointment. What may interact with this medication? Do not take this medication with any of the following: Live virus vaccines This medication may also interact with the following: Medications that treat or  prevent blood clots, such as warfarin, enoxaparin, dalteparin This list may not describe all possible interactions. Give your health care provider a list of all the medicines, herbs, non-prescription drugs, or dietary supplements you use. Also tell them if you smoke, drink alcohol, or use illegal drugs. Some items may interact with your medicine. What should I watch for while using this medication? Your condition will be monitored carefully while you are receiving this medication. This medication may make you feel generally unwell. This is not uncommon as chemotherapy can affect healthy cells as well as cancer cells. Report any side effects. Continue your course of treatment even though you feel ill unless your care team tells you to stop. In some cases, you may be given additional medications to help with side effects. Follow all directions for their use. This medication may increase your risk of getting an infection. Call your care team for advice if you get a fever, chills, sore throat, or other symptoms of a cold or flu. Do not treat yourself. Try to avoid being around people who are sick. This medication may increase your risk to bruise or bleed. Call your care team if you notice any unusual bleeding. Be careful brushing or flossing your teeth or using a toothpick because you may get an infection or bleed more easily. If you have any dental work done, tell your dentist you are receiving this medication. Avoid taking medications that contain aspirin, acetaminophen, ibuprofen, naproxen, or ketoprofen unless instructed by your care team. These medications may hide a fever. Do not treat diarrhea with over the counter products. Contact your care team if you have diarrhea that lasts more than 2 days or if it is severe and watery. This medication can make you more sensitive to the sun. Keep out of the sun. If you cannot avoid being in the sun, wear protective clothing and sunscreen. Do not use sun lamps,  tanning beds, or tanning booths. Talk to your care team if you or your partner wish to become pregnant or think you might be pregnant. This medication can cause serious birth defects if taken during pregnancy and for 3 months after the last dose. A reliable form of contraception is recommended while taking this medication and for 3 months after the last dose. Talk to your care team about effective forms of contraception. Do not father a child while taking this medication and for 3 months after the last dose. Use a condom while having sex during this time period. Do not breastfeed while taking this medication. This medication may cause  infertility. Talk to your care team if you are concerned about your fertility. What side effects may I notice from receiving this medication? Side effects that you should report to your care team as soon as possible: Allergic reactions--skin rash, itching, hives, swelling of the face, lips, tongue, or throat Heart attack--pain or tightness in the chest, shoulders, arms, or jaw, nausea, shortness of breath, cold or clammy skin, feeling faint or lightheaded Heart failure--shortness of breath, swelling of the ankles, feet, or hands, sudden weight gain, unusual weakness or fatigue Heart rhythm changes--fast or irregular heartbeat, dizziness, feeling faint or lightheaded, chest pain, trouble breathing High ammonia level--unusual weakness or fatigue, confusion, loss of appetite, nausea, vomiting, seizures Infection--fever, chills, cough, sore throat, wounds that don't heal, pain or trouble when passing urine, general feeling of discomfort or being unwell Low red blood cell level--unusual weakness or fatigue, dizziness, headache, trouble breathing Pain, tingling, or numbness in the hands or feet, muscle weakness, change in vision, confusion or trouble speaking, loss of balance or coordination, trouble walking, seizures Redness, swelling, and blistering of the skin over hands and  feet Severe or prolonged diarrhea Unusual bruising or bleeding Side effects that usually do not require medical attention (report to your care team if they continue or are bothersome): Dry skin Headache Increased tears Nausea Pain, redness, or swelling with sores inside the mouth or throat Sensitivity to light Vomiting This list may not describe all possible side effects. Call your doctor for medical advice about side effects. You may report side effects to FDA at 1-800-FDA-1088. Where should I keep my medication? This medication is given in a hospital or clinic. It will not be stored at home. NOTE: This sheet is a summary. It may not cover all possible information. If you have questions about this medicine, talk to your doctor, pharmacist, or health care provider.  2024 Elsevier/Gold Standard (2021-05-29 00:00:00)

## 2022-09-04 NOTE — Progress Notes (Signed)
Alison Garner OFFICE PROGRESS NOTE   Diagnosis: Esophagus cancer  INTERVAL HISTORY:   Alison Garner returns as scheduled.  She feels well.  No nausea or vomiting.  No mouth sores.  No diarrhea.  No dysphagia.  No bleeding.  She notes numbness at the ball of the left foot when sitting.  She has occasional numbness at the right toes.  No numbness in the fingertips.  Objective:  Vital signs in last 24 hours:  Blood pressure 138/79, pulse 88, temperature 98.1 F (36.7 C), temperature source Oral, resp. rate 18, height 5\' 5"  (1.651 m), weight 136 lb (61.7 kg), SpO2 100%.    HEENT: No thrush or ulcers. Resp: Lungs clear bilaterally. Cardio: Regular rate and rhythm. GI: No hepatosplenomegaly. Vascular: No leg edema. Neuro: Moderate decrease in vibratory sense over the fingertips bilaterally. Skin: Palms with mild erythema.  No skin breakdown. Port-A-Cath without erythema.  Lab Results:  Lab Results  Component Value Date   WBC 4.0 09/04/2022   HGB 13.0 09/04/2022   HCT 37.6 09/04/2022   MCV 104.4 (H) 09/04/2022   PLT 147 (L) 09/04/2022   NEUTROABS 2.3 09/04/2022    Imaging:  No results found.  Medications: I have reviewed the patient's current medications.  Assessment/Plan: Distal esophagus cancer Esophagram 11/21/2020-moderate to severe esophageal dysmotility, narrowing of the distal esophagus just above the GE junction to 4 mm, 13 mm barium tablet stuck in the distal esophagus Upper endoscopy 12/13/2020-severe esophagitis with nodularity and stricturing at 36 cm-adenocarcinoma, HER2 negative, no loss of mismatch repair protein expression, PD-L1 combined positive score less than 1% CT chest 12/14/2020-accentuated density at the distal esophageal lumen above a small type I hiatal hernia concerning for malignancy, nonpathologically enlarged AP window and right hilar nodes PET 12/21/2020-abnormal FDG uptake at the distal esophagus, no evidence of nodal or distant  metastatic disease Radiation 01/08/2021-02/16/2021 Cycle 1 weekly Taxol/carboplatin 01/08/2021 Cycle 2 weekly Taxol/carboplatin 01/15/2021 Treatment held 01/22/2021 due to neutropenia Positive COVID test 02/02/2021 Taxol/carboplatin 02/15/2021 Endoscopy 04/30/2021-shallow ulceration at the distal esophagus, nodularity at the gastric cardia-biopsies negative for malignancy CTs 04/02/2022-small mediastinal and right hilar lymph nodes.  Esophagus is mildly patulous decreased from previous in the mid to upper aspect of the mediastinum.  More distally the wall thickening of the esophagus appears slightly decreased.  Small hiatal hernia.  The area of thickening does correspond to the abnormality on PET-CT.  Emphysema. Endoscopy 04/15/2022-Short segment salmon-colored mucosa 1 cm above the Z-line.  Scarring at the gastroesophageal junction/Z-line with nonobstructing narrowing.  Nodular mucosa in the gastric cardia immediately distal to the Z-line.  Pathology on GE junction nodule-invasive moderately differentiated adenocarcinoma, tumor arises within a background of high-grade dysplasia, mild chronic active gastritis with foveolar hyperplasia, negative for squamous epithelium and intestinal metaplasia.  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  Negative for HER2 by IHC, PD-L1 CPS 2% PET scan 04/25/2022-increased radiotracer uptake associated with the distal esophagus at the level of the GE junction.  No evidence of nodal metastasis or distant metastatic disease. Cycle 1 FOLFOX 06/04/2022 Cycle 2 FOLFOX 06/18/2022, oxaliplatin dose reduced secondary thrombocytopenia Cycle 3 FOLFOX 06/24/2022, oxaliplatin dose reduced secondary thrombocytopenia, 5-FU dose reduced secondary to hand/foot syndrome 07/16/2022 chemotherapy held due to thrombocytopenia, dehydration Cycle 4 FOLFOX 07/23/2022, oxaliplatin dose reduced due to thrombocytopenia Cycle 5 FOLFOX 08/05/2022 CT chest 08/13/2022-mild focal wall  thickening at the distal esophagus, 8 mm gastropathic node-not FDG avid on PET, unchanged small mediastinal nodes no evidence of metastatic disease in the chest  Upper endoscopy 08/14/2022-benign-appearing mild stenosis in the distal esophagus-microscopic focus of adenocarcinoma Cycle 6 FOLFOX 09/04/2022 Solid dysphagia secondary #1, resolved COPD on chest CT 12/14/2020 Hemorrhoids Hyperlipidemia Irritable bowel syndrome Hypothyroidism Tobacco use Thrombocytopenia secondary to chemotherapy Hand/foot syndrome-5-FU dose reduced with cycle 3 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  She has completed 5 cycles of FOLFOX.  Plan to proceed with cycle 6 today as scheduled.  CBC and chemistry panel reviewed.  Labs adequate to proceed with treatment.  She will return for follow-up and chemotherapy in 2 weeks.  We are available to see her sooner if needed.    [name] ANP/GNP-BC   09/04/2022  8:55 AM

## 2022-09-04 NOTE — Patient Instructions (Signed)

## 2022-09-04 NOTE — Progress Notes (Signed)
Patient seen by Lisa Thomas NP today  Vitals are within treatment parameters.  Labs reviewed by Lisa Thomas NP and are within treatment parameters.  Per physician team, patient is ready for treatment and there are NO modifications to the treatment plan.     

## 2022-09-05 ENCOUNTER — Other Ambulatory Visit: Payer: Self-pay

## 2022-09-06 ENCOUNTER — Inpatient Hospital Stay: Payer: Medicare Other | Attending: Oncology

## 2022-09-06 VITALS — BP 118/64 | HR 100 | Temp 98.3°F | Resp 18

## 2022-09-06 DIAGNOSIS — E785 Hyperlipidemia, unspecified: Secondary | ICD-10-CM | POA: Diagnosis not present

## 2022-09-06 DIAGNOSIS — E039 Hypothyroidism, unspecified: Secondary | ICD-10-CM | POA: Diagnosis not present

## 2022-09-06 DIAGNOSIS — C155 Malignant neoplasm of lower third of esophagus: Secondary | ICD-10-CM | POA: Insufficient documentation

## 2022-09-06 DIAGNOSIS — G620 Drug-induced polyneuropathy: Secondary | ICD-10-CM | POA: Diagnosis not present

## 2022-09-06 DIAGNOSIS — D6959 Other secondary thrombocytopenia: Secondary | ICD-10-CM | POA: Diagnosis not present

## 2022-09-06 DIAGNOSIS — K589 Irritable bowel syndrome without diarrhea: Secondary | ICD-10-CM | POA: Diagnosis not present

## 2022-09-06 DIAGNOSIS — K649 Unspecified hemorrhoids: Secondary | ICD-10-CM | POA: Insufficient documentation

## 2022-09-06 DIAGNOSIS — J449 Chronic obstructive pulmonary disease, unspecified: Secondary | ICD-10-CM | POA: Insufficient documentation

## 2022-09-06 DIAGNOSIS — Z5189 Encounter for other specified aftercare: Secondary | ICD-10-CM | POA: Insufficient documentation

## 2022-09-06 MED ORDER — HEPARIN SOD (PORK) LOCK FLUSH 100 UNIT/ML IV SOLN
500.0000 [IU] | Freq: Once | INTRAVENOUS | Status: AC | PRN
  Administered 2022-09-06: 500 [IU]

## 2022-09-06 MED ORDER — SODIUM CHLORIDE 0.9% FLUSH
10.0000 mL | INTRAVENOUS | Status: DC | PRN
  Administered 2022-09-06: 10 mL

## 2022-09-06 MED ORDER — PEGFILGRASTIM-CBQV 6 MG/0.6ML ~~LOC~~ SOSY
6.0000 mg | PREFILLED_SYRINGE | Freq: Once | SUBCUTANEOUS | Status: AC
  Administered 2022-09-06: 6 mg via SUBCUTANEOUS
  Filled 2022-09-06: qty 0.6

## 2022-09-06 NOTE — Patient Instructions (Signed)

## 2022-09-12 ENCOUNTER — Other Ambulatory Visit: Payer: Self-pay | Admitting: Oncology

## 2022-09-13 ENCOUNTER — Telehealth: Payer: Self-pay

## 2022-09-13 ENCOUNTER — Telehealth: Payer: Self-pay | Admitting: *Deleted

## 2022-09-13 NOTE — Telephone Encounter (Signed)
A patient called to report that she injured her tibia last Wednesday while standing and twisting her leg. She has been diagnosed with a fractured tibia following an X-ray at urgent care. The leg is currently swollen and painful. She plans to come to [facility] to follow up at the orthopedics clinic.

## 2022-09-13 NOTE — Telephone Encounter (Signed)
Reports she has sustained a tibia fracture at home. Was seen and x-ray by urgent care. Sees ortho on 09/18/22 at 1pm. Wants to keep her lab/flush/OV on 814, but cancel chemo until she knows if she will need surgery. Asking if there are any other precautions she needs to do before Wednesday. Informed her to just keep her leg elevated as much as possible and immobilized. Will alert provider on Monday.

## 2022-09-16 ENCOUNTER — Encounter: Payer: Self-pay | Admitting: Oncology

## 2022-09-17 ENCOUNTER — Inpatient Hospital Stay: Payer: Medicare Other | Admitting: Oncology

## 2022-09-18 ENCOUNTER — Inpatient Hospital Stay: Payer: Medicare Other

## 2022-09-18 ENCOUNTER — Other Ambulatory Visit (HOSPITAL_BASED_OUTPATIENT_CLINIC_OR_DEPARTMENT_OTHER): Payer: Self-pay

## 2022-09-18 ENCOUNTER — Encounter: Payer: Self-pay | Admitting: Oncology

## 2022-09-18 ENCOUNTER — Other Ambulatory Visit: Payer: Self-pay

## 2022-09-18 ENCOUNTER — Inpatient Hospital Stay: Payer: Medicare Other | Admitting: Oncology

## 2022-09-18 VITALS — BP 118/74 | HR 98 | Temp 98.1°F | Resp 18 | Ht 65.0 in | Wt 135.0 lb

## 2022-09-18 DIAGNOSIS — C155 Malignant neoplasm of lower third of esophagus: Secondary | ICD-10-CM | POA: Diagnosis not present

## 2022-09-18 LAB — CBC WITH DIFFERENTIAL (CANCER CENTER ONLY)
Abs Immature Granulocytes: 0.06 10*3/uL (ref 0.00–0.07)
Basophils Absolute: 0 10*3/uL (ref 0.0–0.1)
Basophils Relative: 0 %
Eosinophils Absolute: 0.1 10*3/uL (ref 0.0–0.5)
Eosinophils Relative: 1 %
HCT: 37.3 % (ref 36.0–46.0)
Hemoglobin: 12.9 g/dL (ref 12.0–15.0)
Immature Granulocytes: 1 %
Lymphocytes Relative: 14 %
Lymphs Abs: 1.1 10*3/uL (ref 0.7–4.0)
MCH: 35.7 pg — ABNORMAL HIGH (ref 26.0–34.0)
MCHC: 34.6 g/dL (ref 30.0–36.0)
MCV: 103.3 fL — ABNORMAL HIGH (ref 80.0–100.0)
Monocytes Absolute: 0.4 10*3/uL (ref 0.1–1.0)
Monocytes Relative: 5 %
Neutro Abs: 5.8 10*3/uL (ref 1.7–7.7)
Neutrophils Relative %: 79 %
Platelet Count: 123 10*3/uL — ABNORMAL LOW (ref 150–400)
RBC: 3.61 MIL/uL — ABNORMAL LOW (ref 3.87–5.11)
RDW: 15.2 % (ref 11.5–15.5)
WBC Count: 7.4 10*3/uL (ref 4.0–10.5)
nRBC: 0 % (ref 0.0–0.2)

## 2022-09-18 LAB — CMP (CANCER CENTER ONLY)
ALT: 9 U/L (ref 0–44)
AST: 14 U/L — ABNORMAL LOW (ref 15–41)
Albumin: 3.6 g/dL (ref 3.5–5.0)
Alkaline Phosphatase: 106 U/L (ref 38–126)
Anion gap: 9 (ref 5–15)
BUN: 8 mg/dL (ref 8–23)
CO2: 24 mmol/L (ref 22–32)
Calcium: 8.6 mg/dL — ABNORMAL LOW (ref 8.9–10.3)
Chloride: 102 mmol/L (ref 98–111)
Creatinine: 0.9 mg/dL (ref 0.44–1.00)
GFR, Estimated: >60 mL/min (ref >60)
Glucose, Bld: 148 mg/dL — ABNORMAL HIGH (ref 70–99)
Potassium: 3.8 mmol/L (ref 3.5–5.1)
Sodium: 135 mmol/L (ref 135–145)
Total Bilirubin: 0.6 mg/dL (ref 0.3–1.2)
Total Protein: 6.1 g/dL — ABNORMAL LOW (ref 6.5–8.1)

## 2022-09-18 MED ORDER — POTASSIUM CHLORIDE CRYS ER 20 MEQ PO TBCR
20.0000 meq | EXTENDED_RELEASE_TABLET | Freq: Every day | ORAL | 1 refills | Status: DC
Start: 2022-09-18 — End: 2022-12-11
  Filled 2022-09-18 (×2): qty 30, 30d supply, fill #0

## 2022-09-18 NOTE — Progress Notes (Signed)
[facility] OFFICE PROGRESS NOTE   Diagnosis: Esophagus cancer  INTERVAL HISTORY:   Alison. Moors completed another cycle of FOLFOX on 09/04/2022.  No nausea/vomiting or mouth sores.  She reports a headache and "sweats "for a few days following chemotherapy.  She has tingling in the feet on the right greater than left, no other neuropathy symptoms.  She twisted her ankle when getting off of the sofa on 09/11/2022.  She saw orthopedics and was diagnosed with a right fibular fracture.  The ankle, foot, lower leg remain bruised and swollen.  She is wearing a brace.  She feels the brace may be causing bruising.  Objective:  Vital signs in last 24 hours:  Blood pressure 118/74, pulse 98, temperature 98.1 F (36.7 C), temperature source Oral, resp. rate 18, height 5\' 5"  (1.651 m), weight 135 lb (61.2 kg), SpO2 100%.    HEENT: No thrush or ulcers Resp: Clear bilaterally Cardio: Regular rate and rhythm GI: No hepatosplenomegaly Vascular: No leg edema Musculoskeletal: Mild edema at the right ankle and foot with mild erythema/purpura at the right ankle extending proximally to the right pretibial region.  There is yellow discoloration over the tibia.  No palpable cord. Neurologic: Moderate decrease in vibratory sense at the fingertips bilaterally  Portacath/PICC-without erythema  Lab Results:  Lab Results  Component Value Date   WBC 7.4 09/18/2022   HGB 12.9 09/18/2022   HCT 37.3 09/18/2022   MCV 103.3 (H) 09/18/2022   PLT 123 (L) 09/18/2022   NEUTROABS 5.8 09/18/2022    CMP  Lab Results  Component Value Date   NA 135 09/18/2022   K 3.8 09/18/2022   CL 102 09/18/2022   CO2 24 09/18/2022   GLUCOSE 148 (H) 09/18/2022   BUN 8 09/18/2022   CREATININE 0.90 09/18/2022   CALCIUM 8.6 (L) 09/18/2022   PROT 6.1 (L) 09/18/2022   ALBUMIN 3.6 09/18/2022   AST 14 (L) 09/18/2022   ALT 9 09/18/2022   ALKPHOS 106 09/18/2022   BILITOT 0.6 09/18/2022   GFRNONAA >60 09/18/2022     Lab Results  Component Value Date   CEA 13.68 (H) 08/20/2022    Medications: I have reviewed the patient's current medications.   Assessment/Plan: Distal esophagus cancer Esophagram 11/21/2020-moderate to severe esophageal dysmotility, narrowing of the distal esophagus just above the GE junction to 4 mm, 13 mm barium tablet stuck in the distal esophagus Upper endoscopy 12/13/2020-severe esophagitis with nodularity and stricturing at 36 cm-adenocarcinoma, HER2 negative, no loss of mismatch repair protein expression, PD-L1 combined positive score less than 1% CT chest 12/14/2020-accentuated density at the distal esophageal lumen above a small type I hiatal hernia concerning for malignancy, nonpathologically enlarged AP window and right hilar nodes PET 12/21/2020-abnormal FDG uptake at the distal esophagus, no evidence of nodal or distant metastatic disease Radiation 01/08/2021-02/16/2021 Cycle 1 weekly Taxol/carboplatin 01/08/2021 Cycle 2 weekly Taxol/carboplatin 01/15/2021 Treatment held 01/22/2021 due to neutropenia Positive COVID test 02/02/2021 Taxol/carboplatin 02/15/2021 Endoscopy 04/30/2021-shallow ulceration at the distal esophagus, nodularity at the gastric cardia-biopsies negative for malignancy CTs 04/02/2022-small mediastinal and right hilar lymph nodes.  Esophagus is mildly patulous decreased from previous in the mid to upper aspect of the mediastinum.  More distally the wall thickening of the esophagus appears slightly decreased.  Small hiatal hernia.  The area of thickening does correspond to the abnormality on PET-CT.  Emphysema. Endoscopy 04/15/2022-Short segment salmon-colored mucosa 1 cm above the Z-line.  Scarring at the gastroesophageal junction/Z-line with nonobstructing narrowing.  Nodular mucosa  in the gastric cardia immediately distal to the Z-line.  Pathology on GE junction nodule-invasive moderately differentiated adenocarcinoma, tumor arises within a background of  high-grade dysplasia, mild chronic active gastritis with foveolar hyperplasia, negative for squamous epithelium and intestinal metaplasia.  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  Negative for HER2 by IHC, PD-L1 CPS 2% PET scan 04/25/2022-increased radiotracer uptake associated with the distal esophagus at the level of the GE junction.  No evidence of nodal metastasis or distant metastatic disease. Cycle 1 FOLFOX 06/04/2022 Cycle 2 FOLFOX 06/18/2022, oxaliplatin dose reduced secondary thrombocytopenia Cycle 3 FOLFOX 06/24/2022, oxaliplatin dose reduced secondary thrombocytopenia, 5-FU dose reduced secondary to hand/foot syndrome 07/16/2022 chemotherapy held due to thrombocytopenia, dehydration Cycle 4 FOLFOX 07/23/2022, oxaliplatin dose reduced due to thrombocytopenia Cycle 5 FOLFOX 08/05/2022 CT chest 08/13/2022-mild focal wall thickening at the distal esophagus, 8 mm gastropathic node-not FDG avid on PET, unchanged small mediastinal nodes no evidence of metastatic disease in the chest Upper endoscopy 08/14/2022-benign-appearing mild stenosis in the distal esophagus-microscopic focus of adenocarcinoma Cycle 6 FOLFOX 09/04/2022 Solid dysphagia secondary #1, resolved COPD on chest CT 12/14/2020 Hemorrhoids Hyperlipidemia Irritable bowel syndrome Hypothyroidism Tobacco use Thrombocytopenia secondary to chemotherapy Hand/foot syndrome-5-FU dose reduced with cycle 3 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  There is no clinical evidence of disease progression.  The plan is to complete several more cycles of FOLFOX and then consider switching to maintenance 5-fluorouracil.  She fractured the right fibula 1 week ago.  She is concerned about the effect of chemotherapy on healing of the fracture.  I recommend proceeding with chemotherapy, but she  is not comfortable receiving chemotherapy today.  She would like to wait until after she sees orthopedics next week.  She will return for an office visit with the plan to continue FOLFOX in 2 weeks.  I suspect the right lower extremity swelling and discoloration are related to the sprain/fracture.  She will call for increased swelling or erythema at the right lower leg.    Thornton Papas, MD  09/18/2022  8:42 AM

## 2022-09-19 ENCOUNTER — Other Ambulatory Visit: Payer: Self-pay

## 2022-09-20 ENCOUNTER — Inpatient Hospital Stay: Payer: Medicare Other

## 2022-10-02 ENCOUNTER — Inpatient Hospital Stay: Payer: Medicare Other | Admitting: Nurse Practitioner

## 2022-10-02 ENCOUNTER — Inpatient Hospital Stay: Payer: Medicare Other

## 2022-10-02 ENCOUNTER — Encounter: Payer: Self-pay | Admitting: Nurse Practitioner

## 2022-10-02 DIAGNOSIS — C155 Malignant neoplasm of lower third of esophagus: Secondary | ICD-10-CM

## 2022-10-02 LAB — CBC WITH DIFFERENTIAL (CANCER CENTER ONLY)
Abs Immature Granulocytes: 0.01 10*3/uL (ref 0.00–0.07)
Basophils Absolute: 0 10*3/uL (ref 0.0–0.1)
Basophils Relative: 1 %
Eosinophils Absolute: 0.2 10*3/uL (ref 0.0–0.5)
Eosinophils Relative: 3 %
HCT: 40.3 % (ref 36.0–46.0)
Hemoglobin: 13.9 g/dL (ref 12.0–15.0)
Immature Granulocytes: 0 %
Lymphocytes Relative: 28 %
Lymphs Abs: 1.6 10*3/uL (ref 0.7–4.0)
MCH: 35.1 pg — ABNORMAL HIGH (ref 26.0–34.0)
MCHC: 34.5 g/dL (ref 30.0–36.0)
MCV: 101.8 fL — ABNORMAL HIGH (ref 80.0–100.0)
Monocytes Absolute: 0.5 10*3/uL (ref 0.1–1.0)
Monocytes Relative: 10 %
Neutro Abs: 3.2 10*3/uL (ref 1.7–7.7)
Neutrophils Relative %: 58 %
Platelet Count: 168 10*3/uL (ref 150–400)
RBC: 3.96 MIL/uL (ref 3.87–5.11)
RDW: 14.4 % (ref 11.5–15.5)
WBC Count: 5.5 10*3/uL (ref 4.0–10.5)
nRBC: 0 % (ref 0.0–0.2)

## 2022-10-02 LAB — CMP (CANCER CENTER ONLY)
ALT: 15 U/L (ref 0–44)
AST: 20 U/L (ref 15–41)
Albumin: 4.2 g/dL (ref 3.5–5.0)
Alkaline Phosphatase: 90 U/L (ref 38–126)
Anion gap: 9 (ref 5–15)
BUN: 13 mg/dL (ref 8–23)
CO2: 25 mmol/L (ref 22–32)
Calcium: 9.8 mg/dL (ref 8.9–10.3)
Chloride: 101 mmol/L (ref 98–111)
Creatinine: 0.88 mg/dL (ref 0.44–1.00)
GFR, Estimated: >60 mL/min (ref >60)
Glucose, Bld: 149 mg/dL — ABNORMAL HIGH (ref 70–99)
Potassium: 3.5 mmol/L (ref 3.5–5.1)
Sodium: 135 mmol/L (ref 135–145)
Total Bilirubin: 0.4 mg/dL (ref 0.3–1.2)
Total Protein: 6.9 g/dL (ref 6.5–8.1)

## 2022-10-02 NOTE — Progress Notes (Signed)
Alison Garner OFFICE PROGRESS NOTE   Diagnosis: Esophagus cancer  INTERVAL HISTORY:   [name] returns as scheduled.  She completed a cycle of FOLFOX 09/04/2022.  She declined chemotherapy on 09/18/2022 due to concern regarding healing of the fibula fracture.  Next follow-up appointment with orthopedics is 10/21/2022.  She feels well.  She has a good appetite.  No nausea or vomiting.  No mouth sores.  No change in baseline bowel habits, "IBS".  She has intermittent numbness/tingling in the fingertips and toes.  No dysphagia.  Objective:  Vital signs in last 24 hours:  Blood pressure 119/60, pulse 97, temperature 98.1 F (36.7 C), temperature source Oral, resp. rate 18, height 5\' 5"  (1.651 m), weight 137 lb (62.1 kg), SpO2 98%.    HEENT: No thrush or ulcers. Resp: Lungs clear bilaterally. Cardio: Regular rate and rhythm. GI: No hepatosplenomegaly. Vascular: No leg edema. Skin: Palms without erythema. Port-A-Cath without erythema.  Lab Results:  Lab Results  Component Value Date   WBC 5.5 10/02/2022   HGB 13.9 10/02/2022   HCT 40.3 10/02/2022   MCV 101.8 (H) 10/02/2022   PLT 168 10/02/2022   NEUTROABS 3.2 10/02/2022    Imaging:  No results found.  Medications: I have reviewed the patient's current medications.  Assessment/Plan: Distal esophagus cancer Esophagram 11/21/2020-moderate to severe esophageal dysmotility, narrowing of the distal esophagus just above the GE junction to 4 mm, 13 mm barium tablet stuck in the distal esophagus Upper endoscopy 12/13/2020-severe esophagitis with nodularity and stricturing at 36 cm-adenocarcinoma, HER2 negative, no loss of mismatch repair protein expression, PD-L1 combined positive score less than 1% CT chest 12/14/2020-accentuated density at the distal esophageal lumen above a small type I hiatal hernia concerning for malignancy, nonpathologically enlarged AP window and right hilar nodes PET 12/21/2020-abnormal FDG  uptake at the distal esophagus, no evidence of nodal or distant metastatic disease Radiation 01/08/2021-02/16/2021 Cycle 1 weekly Taxol/carboplatin 01/08/2021 Cycle 2 weekly Taxol/carboplatin 01/15/2021 Treatment held 01/22/2021 due to neutropenia Positive COVID test 02/02/2021 Taxol/carboplatin 02/15/2021 Endoscopy 04/30/2021-shallow ulceration at the distal esophagus, nodularity at the gastric cardia-biopsies negative for malignancy CTs 04/02/2022-small mediastinal and right hilar lymph nodes.  Esophagus is mildly patulous decreased from previous in the mid to upper aspect of the mediastinum.  More distally the wall thickening of the esophagus appears slightly decreased.  Small hiatal hernia.  The area of thickening does correspond to the abnormality on PET-CT.  Emphysema. Endoscopy 04/15/2022-Short segment salmon-colored mucosa 1 cm above the Z-line.  Scarring at the gastroesophageal junction/Z-line with nonobstructing narrowing.  Nodular mucosa in the gastric cardia immediately distal to the Z-line.  Pathology on GE junction nodule-invasive moderately differentiated adenocarcinoma, tumor arises within a background of high-grade dysplasia, mild chronic active gastritis with foveolar hyperplasia, negative for squamous epithelium and intestinal metaplasia.  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  Negative for HER2 by IHC, PD-L1 CPS 2% PET scan 04/25/2022-increased radiotracer uptake associated with the distal esophagus at the level of the GE junction.  No evidence of nodal metastasis or distant metastatic disease. Cycle 1 FOLFOX 06/04/2022 Cycle 2 FOLFOX 06/18/2022, oxaliplatin dose reduced secondary thrombocytopenia Cycle 3 FOLFOX 06/24/2022, oxaliplatin dose reduced secondary thrombocytopenia, 5-FU dose reduced secondary to hand/foot syndrome 07/16/2022 chemotherapy held due to thrombocytopenia, dehydration Cycle 4 FOLFOX 07/23/2022, oxaliplatin dose reduced due to  thrombocytopenia Cycle 5 FOLFOX 08/05/2022 CT chest 08/13/2022-mild focal wall thickening at the distal esophagus, 8 mm gastropathic node-not FDG avid on PET, unchanged small mediastinal nodes no evidence of  metastatic disease in the chest Upper endoscopy 08/14/2022-benign-appearing mild stenosis in the distal esophagus-microscopic focus of adenocarcinoma Cycle 6 FOLFOX 09/04/2022 Solid dysphagia secondary #1, resolved COPD on chest CT 12/14/2020 Hemorrhoids Hyperlipidemia Irritable bowel syndrome Hypothyroidism Tobacco use Thrombocytopenia secondary to chemotherapy Hand/foot syndrome-5-FU dose reduced with cycle 3 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 appears stable.  She completed a cycle of FOLFOX 09/04/2022.  She fractured the right fibula in early August.  She is concerned about impaired healing while on chemotherapy.  She does not want to resume treatment at this time.  She has a follow-up appointment with orthopedics on 10/21/2022.  She will return for follow-up and treatment on 10/23/2022.  We discussed continuation of FOLFOX versus maintenance therapy.  When she returns on 10/23/2022 the plan will be to begin maintenance with repeat CTs after 2 to 3 months.    Alison Garner ANP/GNP-BC   10/02/2022  2:28 PM

## 2022-10-04 ENCOUNTER — Other Ambulatory Visit: Payer: Self-pay

## 2022-10-04 ENCOUNTER — Inpatient Hospital Stay: Payer: Medicare Other

## 2022-10-16 ENCOUNTER — Inpatient Hospital Stay: Payer: Medicare Other | Admitting: Nurse Practitioner

## 2022-10-16 ENCOUNTER — Inpatient Hospital Stay: Payer: Medicare Other

## 2022-10-18 ENCOUNTER — Inpatient Hospital Stay: Payer: Medicare Other

## 2022-10-21 ENCOUNTER — Encounter: Payer: Self-pay | Admitting: Nurse Practitioner

## 2022-10-22 ENCOUNTER — Encounter: Payer: Self-pay | Admitting: *Deleted

## 2022-10-22 NOTE — Progress Notes (Signed)
Patient requesting to move her appointments from 9/18 to following week. Per [name], NP, she can see her on 9/25 at 1145. Scheduling message sent to move all 9/18 to 9/25.

## 2022-10-23 ENCOUNTER — Inpatient Hospital Stay: Payer: Medicare Other | Admitting: Nurse Practitioner

## 2022-10-23 ENCOUNTER — Inpatient Hospital Stay: Payer: Medicare Other

## 2022-10-23 ENCOUNTER — Other Ambulatory Visit: Payer: Self-pay

## 2022-10-25 ENCOUNTER — Inpatient Hospital Stay: Payer: Medicare Other

## 2022-10-30 ENCOUNTER — Inpatient Hospital Stay: Payer: Medicare Other

## 2022-10-30 ENCOUNTER — Encounter: Payer: Self-pay | Admitting: Nurse Practitioner

## 2022-10-30 ENCOUNTER — Other Ambulatory Visit: Payer: Self-pay | Admitting: Nurse Practitioner

## 2022-10-30 ENCOUNTER — Inpatient Hospital Stay: Payer: Medicare Other | Attending: Oncology

## 2022-10-30 ENCOUNTER — Inpatient Hospital Stay (HOSPITAL_BASED_OUTPATIENT_CLINIC_OR_DEPARTMENT_OTHER): Payer: Medicare Other | Admitting: Nurse Practitioner

## 2022-10-30 ENCOUNTER — Other Ambulatory Visit: Payer: Self-pay | Admitting: Oncology

## 2022-10-30 ENCOUNTER — Encounter: Payer: Self-pay | Admitting: Oncology

## 2022-10-30 VITALS — BP 128/70 | HR 89 | Temp 98.1°F | Resp 18 | Ht 65.0 in | Wt 134.7 lb

## 2022-10-30 DIAGNOSIS — D6959 Other secondary thrombocytopenia: Secondary | ICD-10-CM | POA: Diagnosis not present

## 2022-10-30 DIAGNOSIS — J449 Chronic obstructive pulmonary disease, unspecified: Secondary | ICD-10-CM | POA: Insufficient documentation

## 2022-10-30 DIAGNOSIS — E785 Hyperlipidemia, unspecified: Secondary | ICD-10-CM | POA: Diagnosis not present

## 2022-10-30 DIAGNOSIS — G620 Drug-induced polyneuropathy: Secondary | ICD-10-CM | POA: Insufficient documentation

## 2022-10-30 DIAGNOSIS — K649 Unspecified hemorrhoids: Secondary | ICD-10-CM | POA: Insufficient documentation

## 2022-10-30 DIAGNOSIS — S82401A Unspecified fracture of shaft of right fibula, initial encounter for closed fracture: Secondary | ICD-10-CM | POA: Insufficient documentation

## 2022-10-30 DIAGNOSIS — K589 Irritable bowel syndrome without diarrhea: Secondary | ICD-10-CM | POA: Insufficient documentation

## 2022-10-30 DIAGNOSIS — E039 Hypothyroidism, unspecified: Secondary | ICD-10-CM | POA: Insufficient documentation

## 2022-10-30 DIAGNOSIS — C155 Malignant neoplasm of lower third of esophagus: Secondary | ICD-10-CM

## 2022-10-30 DIAGNOSIS — Z5111 Encounter for antineoplastic chemotherapy: Secondary | ICD-10-CM | POA: Diagnosis present

## 2022-10-30 DIAGNOSIS — B084 Enteroviral vesicular stomatitis with exanthem: Secondary | ICD-10-CM | POA: Diagnosis not present

## 2022-10-30 LAB — CBC WITH DIFFERENTIAL (CANCER CENTER ONLY)
Abs Immature Granulocytes: 0.01 10*3/uL (ref 0.00–0.07)
Basophils Absolute: 0 10*3/uL (ref 0.0–0.1)
Basophils Relative: 0 %
Eosinophils Absolute: 0.1 10*3/uL (ref 0.0–0.5)
Eosinophils Relative: 3 %
HCT: 38.9 % (ref 36.0–46.0)
Hemoglobin: 13.7 g/dL (ref 12.0–15.0)
Immature Granulocytes: 0 %
Lymphocytes Relative: 33 %
Lymphs Abs: 1.6 10*3/uL (ref 0.7–4.0)
MCH: 33.4 pg (ref 26.0–34.0)
MCHC: 35.2 g/dL (ref 30.0–36.0)
MCV: 94.9 fL (ref 80.0–100.0)
Monocytes Absolute: 0.4 10*3/uL (ref 0.1–1.0)
Monocytes Relative: 9 %
Neutro Abs: 2.6 10*3/uL (ref 1.7–7.7)
Neutrophils Relative %: 55 %
Platelet Count: 146 10*3/uL — ABNORMAL LOW (ref 150–400)
RBC: 4.1 MIL/uL (ref 3.87–5.11)
RDW: 14.1 % (ref 11.5–15.5)
WBC Count: 4.8 10*3/uL (ref 4.0–10.5)
nRBC: 0 % (ref 0.0–0.2)

## 2022-10-30 LAB — CMP (CANCER CENTER ONLY)
ALT: 13 U/L (ref 0–44)
AST: 17 U/L (ref 15–41)
Albumin: 4.1 g/dL (ref 3.5–5.0)
Alkaline Phosphatase: 104 U/L (ref 38–126)
Anion gap: 6 (ref 5–15)
BUN: 13 mg/dL (ref 8–23)
CO2: 28 mmol/L (ref 22–32)
Calcium: 9.5 mg/dL (ref 8.9–10.3)
Chloride: 99 mmol/L (ref 98–111)
Creatinine: 0.87 mg/dL (ref 0.44–1.00)
GFR, Estimated: >60 mL/min (ref >60)
Glucose, Bld: 84 mg/dL (ref 70–99)
Potassium: 4.1 mmol/L (ref 3.5–5.1)
Sodium: 133 mmol/L — ABNORMAL LOW (ref 135–145)
Total Bilirubin: 0.5 mg/dL (ref 0.3–1.2)
Total Protein: 6.9 g/dL (ref 6.5–8.1)

## 2022-10-30 MED ORDER — SODIUM CHLORIDE 0.9 % IV SOLN
1800.0000 mg/m2 | INTRAVENOUS | Status: DC
  Administered 2022-10-30: 3000 mg via INTRAVENOUS
  Filled 2022-10-30: qty 60

## 2022-10-30 NOTE — Patient Instructions (Addendum)
[facility] [facility]   The chemotherapy medication bag should finish at 46 hours, 96 hours, or 7 days. For example, if your pump is scheduled for 46 hours and it was put on at 4:00 p.m., it should finish at 2:00 p.m. the day it is scheduled to come off regardless of your appointment time.     Estimated time to finish at 12:30 Friday, November 01, 2022.   If the display on your pump reads "Low Volume" and it is beeping, take the batteries out of the pump and come to the cancer center for it to be taken off.   If the pump alarms go off prior to the pump reading "Low Volume" then call [phone] and someone can assist you.  If the plunger comes out and the chemotherapy medication is leaking out, please use your home chemo spill kit to clean up the spill. Do NOT use paper towels or other household products.  If you have problems or questions regarding your pump, please call either [phone] (24 hours a day) or the cancer center Monday-Friday 8:00 a.m.- 4:30 p.m. at the clinic number and we will assist you. If you are unable to get assistance, then go to the nearest Emergency Department and ask the staff to contact the IV team for assistance.  Discharge Instructions: Thank you for choosing [facility] to provide your oncology and hematology care.   If you have a lab appointment with the Cancer Center, please go directly to the Cancer Center and check in at the registration area.   Wear comfortable clothing and clothing appropriate for easy access to any Portacath or PICC line.   We strive to give you quality time with your provider. You may need to reschedule your appointment if you arrive late (15 or more minutes).  Arriving late affects you and other patients whose appointments are after yours.  Also, if you miss three or more appointments without notifying the office, you may be dismissed from the clinic at the provider's discretion.      For  prescription refill requests, have your pharmacy contact our office and allow 72 hours for refills to be completed.    Today you received the following chemotherapy and/or immunotherapy agents Fluorouracil.      To help prevent nausea and vomiting after your treatment, we encourage you to take your nausea medication as directed.  BELOW ARE SYMPTOMS THAT SHOULD BE REPORTED IMMEDIATELY: *FEVER GREATER THAN 100.4 F (38 C) OR HIGHER *CHILLS OR SWEATING *NAUSEA AND VOMITING THAT IS NOT CONTROLLED WITH YOUR NAUSEA MEDICATION *UNUSUAL SHORTNESS OF BREATH *UNUSUAL BRUISING OR BLEEDING *URINARY PROBLEMS (pain or burning when urinating, or frequent urination) *BOWEL PROBLEMS (unusual diarrhea, constipation, pain near the anus) TENDERNESS IN MOUTH AND THROAT WITH OR WITHOUT PRESENCE OF ULCERS (sore throat, sores in mouth, or a toothache) UNUSUAL RASH, SWELLING OR PAIN  UNUSUAL VAGINAL DISCHARGE OR ITCHING   Items with * indicate a potential emergency and should be followed up as soon as possible or go to the Emergency Department if any problems should occur.  Please show the CHEMOTHERAPY ALERT CARD or IMMUNOTHERAPY ALERT CARD at check-in to the Emergency Department and triage nurse.  Should you have questions after your visit or need to cancel or reschedule your appointment, please contact [facility]  Dept: [phone]  and follow the prompts.  Office hours are 8:00 a.m. to 4:30 p.m. Monday - Friday. Please note that voicemails  left after 4:00 p.m. may not be returned until the following business day.  We are closed weekends and major holidays. You have access to a nurse at all times for urgent questions. Please call the main number to the clinic Dept: [phone] and follow the prompts.   For any non-urgent questions, you may also contact your provider using MyChart. We now offer e-Visits for anyone 45 and older to request care online for non-urgent symptoms.  For details visit [email or web address].   Also download the MyChart app! Go to the app store, search "MyChart", open the app, select [place], and log in with your MyChart username and password.  Fluorouracil Injection What is this medication? FLUOROURACIL (flure oh YOOR a sil) treats some types of cancer. It works by slowing down the growth of cancer cells. This medicine may be used for other purposes; ask your health care provider or pharmacist if you have questions. COMMON BRAND NAME(S): Adrucil What should I tell my care team before I take this medication? They need to know if you have any of these conditions: Blood disorders Dihydropyrimidine dehydrogenase (DPD) deficiency Infection, such as chickenpox, cold sores, herpes Kidney disease Liver disease Poor nutrition Recent or ongoing radiation therapy An unusual or allergic reaction to fluorouracil, other medications, foods, dyes, or preservatives If you or your partner are pregnant or trying to get pregnant Breast-feeding How should I use this medication? This medication is injected into a vein. It is administered by your care team in a hospital or clinic setting. Talk to your care team about the use of this medication in children. Special care may be needed. Overdosage: If you think you have taken too much of this medicine contact a poison control center or emergency room at once. NOTE: This medicine is only for you. Do not share this medicine with others. What if I miss a dose? Keep appointments for follow-up doses. It is important not to miss your dose. Call your care team if you are unable to keep an appointment. What may interact with this medication? Do not take this medication with any of the following: Live virus vaccines This medication may also interact with the following: Medications that treat or prevent blood clots, such as warfarin, enoxaparin, dalteparin This list may not describe all possible interactions.  Give your health care provider a list of all the medicines, herbs, non-prescription drugs, or dietary supplements you use. Also tell them if you smoke, drink alcohol, or use illegal drugs. Some items may interact with your medicine. What should I watch for while using this medication? Your condition will be monitored carefully while you are receiving this medication. This medication may make you feel generally unwell. This is not uncommon as chemotherapy can affect healthy cells as well as cancer cells. Report any side effects. Continue your course of treatment even though you feel ill unless your care team tells you to stop. In some cases, you may be given additional medications to help with side effects. Follow all directions for their use. This medication may increase your risk of getting an infection. Call your care team for advice if you get a fever, chills, sore throat, or other symptoms of a cold or flu. Do not treat yourself. Try to avoid being around people who are sick. This medication may increase your risk to bruise or bleed. Call your care team if you notice any unusual bleeding. Be careful brushing or flossing your teeth or using a toothpick because you may get an infection  or bleed more easily. If you have any dental work done, tell your dentist you are receiving this medication. Avoid taking medications that contain aspirin, acetaminophen, ibuprofen, naproxen, or ketoprofen unless instructed by your care team. These medications may hide a fever. Do not treat diarrhea with over the counter products. Contact your care team if you have diarrhea that lasts more than 2 days or if it is severe and watery. This medication can make you more sensitive to the sun. Keep out of the sun. If you cannot avoid being in the sun, wear protective clothing and sunscreen. Do not use sun lamps, tanning beds, or tanning booths. Talk to your care team if you or your partner wish to become pregnant or think you  might be pregnant. This medication can cause serious birth defects if taken during pregnancy and for 3 months after the last dose. A reliable form of contraception is recommended while taking this medication and for 3 months after the last dose. Talk to your care team about effective forms of contraception. Do not father a child while taking this medication and for 3 months after the last dose. Use a condom while having sex during this time period. Do not breastfeed while taking this medication. This medication may cause infertility. Talk to your care team if you are concerned about your fertility. What side effects may I notice from receiving this medication? Side effects that you should report to your care team as soon as possible: Allergic reactions--skin rash, itching, hives, swelling of the face, lips, tongue, or throat Heart attack--pain or tightness in the chest, shoulders, arms, or jaw, nausea, shortness of breath, cold or clammy skin, feeling faint or lightheaded Heart failure--shortness of breath, swelling of the ankles, feet, or hands, sudden weight gain, unusual weakness or fatigue Heart rhythm changes--fast or irregular heartbeat, dizziness, feeling faint or lightheaded, chest pain, trouble breathing High ammonia level--unusual weakness or fatigue, confusion, loss of appetite, nausea, vomiting, seizures Infection--fever, chills, cough, sore throat, wounds that don't heal, pain or trouble when passing urine, general feeling of discomfort or being unwell Low red blood cell level--unusual weakness or fatigue, dizziness, headache, trouble breathing Pain, tingling, or numbness in the hands or feet, muscle weakness, change in vision, confusion or trouble speaking, loss of balance or coordination, trouble walking, seizures Redness, swelling, and blistering of the skin over hands and feet Severe or prolonged diarrhea Unusual bruising or bleeding Side effects that usually do not require medical  attention (report to your care team if they continue or are bothersome): Dry skin Headache Increased tears Nausea Pain, redness, or swelling with sores inside the mouth or throat Sensitivity to light Vomiting This list may not describe all possible side effects. Call your doctor for medical advice about side effects. You may report side effects to FDA at 1-800-FDA-1088. Where should I keep my medication? This medication is given in a hospital or clinic. It will not be stored at home. NOTE: This sheet is a summary. It may not cover all possible information. If you have questions about this medicine, talk to your doctor, pharmacist, or health care provider.  2024 Elsevier/Gold Standard (2021-05-29 00:00:00)

## 2022-10-30 NOTE — Progress Notes (Signed)
[facility] OFFICE PROGRESS NOTE   Diagnosis: Esophagus cancer  INTERVAL HISTORY:   Alison Garner returns for follow-up.  She contacted the office 10/22/2022 to request changing her appointment from 10/23/2022 to today.  She completed a cycle of FOLFOX 09/04/2022.  Treatment has been on hold to allow for healing of a fibula fracture.  She feels she is ready to resume chemotherapy.  She does not want further oxaliplatin.  She would like to begin maintenance with 5-fluorouracil.  She denies dysphagia.  No change in baseline bowel habits.  No nausea or vomiting.  Objective:  Vital signs in last 24 hours:  Blood pressure 128/70, pulse 89, temperature 98.1 F (36.7 C), temperature source Oral, resp. rate 18, height 5\' 5"  (1.651 m), weight 134 lb 11.2 oz (61.1 kg), SpO2 100%.    HEENT: No thrush or ulcers. Resp: Lungs clear bilaterally. Cardio: Regular rate and rhythm. GI: No hepatosplenomegaly. Vascular: No leg edema. Skin: Palms without erythema. Port-A-Cath without erythema.  Lab Results:  Lab Results  Component Value Date   WBC 5.5 10/02/2022   HGB 13.9 10/02/2022   HCT 40.3 10/02/2022   MCV 101.8 (H) 10/02/2022   PLT 168 10/02/2022   NEUTROABS 3.2 10/02/2022    Imaging:  No results found.  Medications: I have reviewed the patient's current medications.  Assessment/Plan: Distal esophagus cancer Esophagram 11/21/2020-moderate to severe esophageal dysmotility, narrowing of the distal esophagus just above the GE junction to 4 mm, 13 mm barium tablet stuck in the distal esophagus Upper endoscopy 12/13/2020-severe esophagitis with nodularity and stricturing at 36 cm-adenocarcinoma, HER2 negative, no loss of mismatch repair protein expression, PD-L1 combined positive score less than 1% CT chest 12/14/2020-accentuated density at the distal esophageal lumen above a small type I hiatal hernia concerning for malignancy, nonpathologically enlarged AP window and right hilar  nodes PET 12/21/2020-abnormal FDG uptake at the distal esophagus, no evidence of nodal or distant metastatic disease Radiation 01/08/2021-02/16/2021 Cycle 1 weekly Taxol/carboplatin 01/08/2021 Cycle 2 weekly Taxol/carboplatin 01/15/2021 Treatment held 01/22/2021 due to neutropenia Positive COVID test 02/02/2021 Taxol/carboplatin 02/15/2021 Endoscopy 04/30/2021-shallow ulceration at the distal esophagus, nodularity at the gastric cardia-biopsies negative for malignancy CTs 04/02/2022-small mediastinal and right hilar lymph nodes.  Esophagus is mildly patulous decreased from previous in the mid to upper aspect of the mediastinum.  More distally the wall thickening of the esophagus appears slightly decreased.  Small hiatal hernia.  The area of thickening does correspond to the abnormality on PET-CT.  Emphysema. Endoscopy 04/15/2022-Short segment salmon-colored mucosa 1 cm above the Z-line.  Scarring at the gastroesophageal junction/Z-line with nonobstructing narrowing.  Nodular mucosa in the gastric cardia immediately distal to the Z-line.  Pathology on GE junction nodule-invasive moderately differentiated adenocarcinoma, tumor arises within a background of high-grade dysplasia, mild chronic active gastritis with foveolar hyperplasia, negative for squamous epithelium and intestinal metaplasia.  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  Negative for HER2 by IHC, PD-L1 CPS 2% PET scan 04/25/2022-increased radiotracer uptake associated with the distal esophagus at the level of the GE junction.  No evidence of nodal metastasis or distant metastatic disease. Cycle 1 FOLFOX 06/04/2022 Cycle 2 FOLFOX 06/18/2022, oxaliplatin dose reduced secondary thrombocytopenia Cycle 3 FOLFOX 06/24/2022, oxaliplatin dose reduced secondary thrombocytopenia, 5-FU dose reduced secondary to hand/foot syndrome 07/16/2022 chemotherapy held due to thrombocytopenia, dehydration Cycle 4 FOLFOX 07/23/2022, oxaliplatin  dose reduced due to thrombocytopenia Cycle 5 FOLFOX 08/05/2022 CT chest 08/13/2022-mild focal wall thickening at the distal esophagus, 8 mm gastropathic node-not FDG avid  on PET, unchanged small mediastinal nodes no evidence of metastatic disease in the chest Upper endoscopy 08/14/2022-benign-appearing mild stenosis in the distal esophagus-microscopic focus of adenocarcinoma Cycle 6 FOLFOX 09/04/2022 Maintenance 5-fluorouracil via pump every 2 weeks beginning 10/30/2022 Solid dysphagia secondary #1, resolved COPD on chest CT 12/14/2020 Hemorrhoids Hyperlipidemia Irritable bowel syndrome Hypothyroidism Tobacco use Thrombocytopenia secondary to chemotherapy Hand/foot syndrome-5-FU dose reduced with cycle 3 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  She has completed 6 cycles of FOLFOX.  Treatment has been on hold since a right fibular fracture 09/11/2022.  She is comfortable resuming treatment today, declines further oxaliplatin.  Plan to begin maintenance 5-fluorouracil via pump today.  Potential toxicities again reviewed.  She agrees to proceed.  CBC and chemistry panel reviewed.  Labs adequate to proceed as above.  She will return for follow-up and treatment in 2 weeks.  She will contact the office in the interim with any problems.  Patient seen with Dr. Truett Perna.    Alison Garner ANP/GNP-BC   10/30/2022  11:53 AM  This was a shared visit with Alison Garner.  Alison Garner appears unchanged.  No symptoms to suggest progression of the esophagus cancer.  We discussed treatment options.  The plan is to begin "maintenance "therapy now that she has recovered from the fibula fracture.  She declines further oxaliplatin and cannot take chemotherapy pills.  She will begin treatment with infusional 5-fluorouracil.  We reviewed potential toxicities with 5-fluorouracil and she agrees to proceed.  We  will plan for a restaging CT evaluation within the next 2-3 months.  I was present for greater than 50% of today's visit.  I performed medical decision making.  [name], MD

## 2022-10-30 NOTE — Progress Notes (Signed)
Patient seen by [name] NP today  Vitals are within treatment parameters:Yes   Labs are within treatment parameters: Yes   Treatment plan has been signed: No   Per physician team, Patient is ready for treatment. Please note the following modifications:only the 5-FU pump

## 2022-10-31 ENCOUNTER — Other Ambulatory Visit: Payer: Self-pay

## 2022-11-01 ENCOUNTER — Inpatient Hospital Stay: Payer: Medicare Other

## 2022-11-01 VITALS — BP 132/69 | HR 89 | Temp 97.8°F | Resp 18

## 2022-11-01 DIAGNOSIS — Z5111 Encounter for antineoplastic chemotherapy: Secondary | ICD-10-CM | POA: Diagnosis not present

## 2022-11-01 DIAGNOSIS — C155 Malignant neoplasm of lower third of esophagus: Secondary | ICD-10-CM

## 2022-11-01 MED ORDER — SODIUM CHLORIDE 0.9% FLUSH
10.0000 mL | INTRAVENOUS | Status: DC | PRN
  Administered 2022-11-01: 10 mL

## 2022-11-01 MED ORDER — HEPARIN SOD (PORK) LOCK FLUSH 100 UNIT/ML IV SOLN
500.0000 [IU] | Freq: Once | INTRAVENOUS | Status: AC | PRN
  Administered 2022-11-01: 500 [IU]

## 2022-11-01 NOTE — Patient Instructions (Signed)

## 2022-11-10 ENCOUNTER — Other Ambulatory Visit: Payer: Self-pay | Admitting: Oncology

## 2022-11-10 DIAGNOSIS — C155 Malignant neoplasm of lower third of esophagus: Secondary | ICD-10-CM

## 2022-11-13 ENCOUNTER — Encounter: Payer: Self-pay | Admitting: Nurse Practitioner

## 2022-11-14 ENCOUNTER — Inpatient Hospital Stay (HOSPITAL_BASED_OUTPATIENT_CLINIC_OR_DEPARTMENT_OTHER): Payer: Medicare Other | Admitting: Nurse Practitioner

## 2022-11-14 ENCOUNTER — Inpatient Hospital Stay: Payer: Medicare Other | Attending: Oncology

## 2022-11-14 ENCOUNTER — Encounter: Payer: Self-pay | Admitting: Nurse Practitioner

## 2022-11-14 ENCOUNTER — Inpatient Hospital Stay: Payer: Medicare Other

## 2022-11-14 VITALS — BP 124/82 | HR 85 | Temp 97.9°F | Resp 18 | Ht 65.0 in | Wt 133.6 lb

## 2022-11-14 DIAGNOSIS — E039 Hypothyroidism, unspecified: Secondary | ICD-10-CM | POA: Insufficient documentation

## 2022-11-14 DIAGNOSIS — Z5111 Encounter for antineoplastic chemotherapy: Secondary | ICD-10-CM | POA: Insufficient documentation

## 2022-11-14 DIAGNOSIS — J449 Chronic obstructive pulmonary disease, unspecified: Secondary | ICD-10-CM | POA: Insufficient documentation

## 2022-11-14 DIAGNOSIS — D6959 Other secondary thrombocytopenia: Secondary | ICD-10-CM | POA: Diagnosis not present

## 2022-11-14 DIAGNOSIS — E785 Hyperlipidemia, unspecified: Secondary | ICD-10-CM | POA: Insufficient documentation

## 2022-11-14 DIAGNOSIS — C155 Malignant neoplasm of lower third of esophagus: Secondary | ICD-10-CM

## 2022-11-14 DIAGNOSIS — K649 Unspecified hemorrhoids: Secondary | ICD-10-CM | POA: Diagnosis not present

## 2022-11-14 DIAGNOSIS — G620 Drug-induced polyneuropathy: Secondary | ICD-10-CM | POA: Insufficient documentation

## 2022-11-14 DIAGNOSIS — K589 Irritable bowel syndrome without diarrhea: Secondary | ICD-10-CM | POA: Diagnosis not present

## 2022-11-14 LAB — CBC WITH DIFFERENTIAL (CANCER CENTER ONLY)
Abs Immature Granulocytes: 0.01 10*3/uL (ref 0.00–0.07)
Basophils Absolute: 0 10*3/uL (ref 0.0–0.1)
Basophils Relative: 1 %
Eosinophils Absolute: 0.1 10*3/uL (ref 0.0–0.5)
Eosinophils Relative: 2 %
HCT: 39.7 % (ref 36.0–46.0)
Hemoglobin: 13.8 g/dL (ref 12.0–15.0)
Immature Granulocytes: 0 %
Lymphocytes Relative: 30 %
Lymphs Abs: 1.2 10*3/uL (ref 0.7–4.0)
MCH: 32.8 pg (ref 26.0–34.0)
MCHC: 34.8 g/dL (ref 30.0–36.0)
MCV: 94.3 fL (ref 80.0–100.0)
Monocytes Absolute: 0.4 10*3/uL (ref 0.1–1.0)
Monocytes Relative: 10 %
Neutro Abs: 2.2 10*3/uL (ref 1.7–7.7)
Neutrophils Relative %: 57 %
Platelet Count: 151 10*3/uL (ref 150–400)
RBC: 4.21 MIL/uL (ref 3.87–5.11)
RDW: 15 % (ref 11.5–15.5)
WBC Count: 3.9 10*3/uL — ABNORMAL LOW (ref 4.0–10.5)
nRBC: 0 % (ref 0.0–0.2)

## 2022-11-14 LAB — CMP (CANCER CENTER ONLY)
ALT: 10 U/L (ref 0–44)
AST: 15 U/L (ref 15–41)
Albumin: 4.2 g/dL (ref 3.5–5.0)
Alkaline Phosphatase: 99 U/L (ref 38–126)
Anion gap: 6 (ref 5–15)
BUN: 14 mg/dL (ref 8–23)
CO2: 28 mmol/L (ref 22–32)
Calcium: 9.4 mg/dL (ref 8.9–10.3)
Chloride: 98 mmol/L (ref 98–111)
Creatinine: 0.89 mg/dL (ref 0.44–1.00)
GFR, Estimated: >60 mL/min (ref >60)
Glucose, Bld: 110 mg/dL — ABNORMAL HIGH (ref 70–99)
Potassium: 4.1 mmol/L (ref 3.5–5.1)
Sodium: 132 mmol/L — ABNORMAL LOW (ref 135–145)
Total Bilirubin: 0.7 mg/dL (ref 0.3–1.2)
Total Protein: 6.8 g/dL (ref 6.5–8.1)

## 2022-11-14 LAB — CEA (ACCESS): CEA (CHCC): 12.09 ng/mL — ABNORMAL HIGH (ref 0.00–5.00)

## 2022-11-14 MED ORDER — HEPARIN SOD (PORK) LOCK FLUSH 100 UNIT/ML IV SOLN: 500.0000 [IU] | Freq: Once | INTRAVENOUS | Status: DC | PRN

## 2022-11-14 MED ORDER — SODIUM CHLORIDE 0.9% FLUSH
10.0000 mL | INTRAVENOUS | Status: DC | PRN
  Administered 2022-11-14: 10 mL

## 2022-11-14 MED ORDER — SODIUM CHLORIDE 0.9 % IV SOLN
1800.0000 mg/m2 | INTRAVENOUS | Status: DC
  Administered 2022-11-14: 3000 mg via INTRAVENOUS
  Filled 2022-11-14: qty 60

## 2022-11-14 NOTE — Progress Notes (Addendum)
Patient seen by [name] NP today  Vitals are within treatment parameters:Yes   Labs are within treatment parameters: Yes   Treatment plan has been signed: Yes   Per physician team, Patient is ready for treatment.

## 2022-11-14 NOTE — Progress Notes (Signed)
[facility] OFFICE PROGRESS NOTE   Diagnosis: Esophagus cancer  INTERVAL HISTORY:   Alison Garner returns as scheduled.  She completed a cycle of infusional 5-fluorouracil 10/30/2022.  She denies nausea/vomiting.  No mouth sores.  No diarrhea.  No dysphagia.  Objective:  Vital signs in last 24 hours:  Blood pressure 124/82, pulse 85, temperature 97.9 F (36.6 C), temperature source Temporal, resp. rate 18, height 5\' 5"  (1.651 m), weight 133 lb 9.6 oz (60.6 kg), SpO2 96%.    HEENT: No thrush or ulcers. Resp: Lungs clear bilaterally. Cardio: Regular rate and rhythm. GI: No hepatosplenomegaly. Vascular: No leg edema. Skin: Hands with hyperpigmentation.  No erythema or skin breakdown. Port-A-Cath without erythema.  Lab Results:  Lab Results  Component Value Date   WBC 4.8 10/30/2022   HGB 13.7 10/30/2022   HCT 38.9 10/30/2022   MCV 94.9 10/30/2022   PLT 146 (L) 10/30/2022   NEUTROABS 2.6 10/30/2022    Imaging:  No results found.  Medications: I have reviewed the patient's current medications.  Assessment/Plan: Distal esophagus cancer Esophagram 11/21/2020-moderate to severe esophageal dysmotility, narrowing of the distal esophagus just above the GE junction to 4 mm, 13 mm barium tablet stuck in the distal esophagus Upper endoscopy 12/13/2020-severe esophagitis with nodularity and stricturing at 36 cm-adenocarcinoma, HER2 negative, no loss of mismatch repair protein expression, PD-L1 combined positive score less than 1% CT chest 12/14/2020-accentuated density at the distal esophageal lumen above a small type I hiatal hernia concerning for malignancy, nonpathologically enlarged AP window and right hilar nodes PET 12/21/2020-abnormal FDG uptake at the distal esophagus, no evidence of nodal or distant metastatic disease Radiation 01/08/2021-02/16/2021 Cycle 1 weekly Taxol/carboplatin 01/08/2021 Cycle 2 weekly Taxol/carboplatin 01/15/2021 Treatment held 01/22/2021  due to neutropenia Positive COVID test 02/02/2021 Taxol/carboplatin 02/15/2021 Endoscopy 04/30/2021-shallow ulceration at the distal esophagus, nodularity at the gastric cardia-biopsies negative for malignancy CTs 04/02/2022-small mediastinal and right hilar lymph nodes.  Esophagus is mildly patulous decreased from previous in the mid to upper aspect of the mediastinum.  More distally the wall thickening of the esophagus appears slightly decreased.  Small hiatal hernia.  The area of thickening does correspond to the abnormality on PET-CT.  Emphysema. Endoscopy 04/15/2022-Short segment salmon-colored mucosa 1 cm above the Z-line.  Scarring at the gastroesophageal junction/Z-line with nonobstructing narrowing.  Nodular mucosa in the gastric cardia immediately distal to the Z-line.  Pathology on GE junction nodule-invasive moderately differentiated adenocarcinoma, tumor arises within a background of high-grade dysplasia, mild chronic active gastritis with foveolar hyperplasia, negative for squamous epithelium and intestinal metaplasia.  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  Negative for HER2 by IHC, PD-L1 CPS 2% PET scan 04/25/2022-increased radiotracer uptake associated with the distal esophagus at the level of the GE junction.  No evidence of nodal metastasis or distant metastatic disease. Cycle 1 FOLFOX 06/04/2022 Cycle 2 FOLFOX 06/18/2022, oxaliplatin dose reduced secondary thrombocytopenia Cycle 3 FOLFOX 06/24/2022, oxaliplatin dose reduced secondary thrombocytopenia, 5-FU dose reduced secondary to hand/foot syndrome 07/16/2022 chemotherapy held due to thrombocytopenia, dehydration Cycle 4 FOLFOX 07/23/2022, oxaliplatin dose reduced due to thrombocytopenia Cycle 5 FOLFOX 08/05/2022 CT chest 08/13/2022-mild focal wall thickening at the distal esophagus, 8 mm gastropathic node-not FDG avid on PET, unchanged small mediastinal nodes no evidence of metastatic disease in the chest Upper  endoscopy 08/14/2022-benign-appearing mild stenosis in the distal esophagus-microscopic focus of adenocarcinoma Cycle 6 FOLFOX 09/04/2022 Maintenance 5-fluorouracil via pump every 2 weeks beginning 10/30/2022 5-fluorouracil via pump 11/14/2022 Solid dysphagia secondary #1, resolved COPD  on chest CT 12/14/2020 Hemorrhoids Hyperlipidemia Irritable bowel syndrome Hypothyroidism Tobacco use Thrombocytopenia secondary to chemotherapy Hand/foot syndrome-5-FU dose reduced with cycle 3 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 appears stable.  She began maintenance 5-fluorouracil 10/30/2022.  She is tolerating well.  No clinical evidence of disease progression.  Plan to proceed with treatment today as scheduled.  We will follow-up on the CEA.  CBC reviewed.  Counts adequate to proceed as above.  She will return for follow-up and treatment in 2 weeks.    [name] ANP/GNP-BC   11/14/2022  8:46 AM

## 2022-11-14 NOTE — Patient Instructions (Signed)

## 2022-11-14 NOTE — Patient Instructions (Addendum)
[facility]   The chemotherapy medication bag should finish at 46 hours, 96 hours, or 7 days. For example, if your pump is scheduled for 46 hours and it was put on at 4:00 p.m., it should finish at 2:00 p.m. the day it is scheduled to come off regardless of your appointment time.     Estimated time to finish at 8:15am Saturday, October 12th, 2024.   If the display on your pump reads "Low Volume" and it is beeping, take the batteries out of the pump and come to the cancer center for it to be taken off.   If the pump alarms go off prior to the pump reading "Low Volume" then call [phone] and someone can assist you.  If the plunger comes out and the chemotherapy medication is leaking out, please use your home chemo spill kit to clean up the spill. Do NOT use paper towels or other household products.  If you have problems or questions regarding your pump, please call either [phone] (24 hours a day) or the cancer center Monday-Friday 8:00 a.m.- 4:30 p.m. at the clinic number and we will assist you. If you are unable to get assistance, then go to the nearest Emergency Department and ask the staff to contact the IV team for assistance.  Discharge Instructions: Thank you for choosing [facility] to provide your oncology and hematology care.   If you have a lab appointment with the Cancer Center, please go directly to the Cancer Center and check in at the registration area.   Wear comfortable clothing and clothing appropriate for easy access to any Portacath or PICC line.   We strive to give you quality time with your provider. You may need to reschedule your appointment if you arrive late (15 or more minutes).  Arriving late affects you and other patients whose appointments are after yours.  Also, if you miss three or more appointments without notifying the office, you may be dismissed from the clinic at the provider's discretion.      For  prescription refill requests, have your pharmacy contact our office and allow 72 hours for refills to be completed.    Today you received the following chemotherapy and/or immunotherapy agents Fluorouracil.      To help prevent nausea and vomiting after your treatment, we encourage you to take your nausea medication as directed.  BELOW ARE SYMPTOMS THAT SHOULD BE REPORTED IMMEDIATELY: *FEVER GREATER THAN 100.4 F (38 C) OR HIGHER *CHILLS OR SWEATING *NAUSEA AND VOMITING THAT IS NOT CONTROLLED WITH YOUR NAUSEA MEDICATION *UNUSUAL SHORTNESS OF BREATH *UNUSUAL BRUISING OR BLEEDING *URINARY PROBLEMS (pain or burning when urinating, or frequent urination) *BOWEL PROBLEMS (unusual diarrhea, constipation, pain near the anus) TENDERNESS IN MOUTH AND THROAT WITH OR WITHOUT PRESENCE OF ULCERS (sore throat, sores in mouth, or a toothache) UNUSUAL RASH, SWELLING OR PAIN  UNUSUAL VAGINAL DISCHARGE OR ITCHING   Items with * indicate a potential emergency and should be followed up as soon as possible or go to the Emergency Department if any problems should occur.  Please show the CHEMOTHERAPY ALERT CARD or IMMUNOTHERAPY ALERT CARD at check-in to the Emergency Department and triage nurse.  Should you have questions after your visit or need to cancel or reschedule your appointment, please contact [facility]  Dept: [phone]  and follow the prompts.  Office hours are 8:00 a.m. to 4:30 p.m. Monday - Friday. Please note that voicemails  left after 4:00 p.m. may not be returned until the following business day.  We are closed weekends and major holidays. You have access to a nurse at all times for urgent questions. Please call the main number to the clinic Dept: [phone] and follow the prompts.   For any non-urgent questions, you may also contact your provider using MyChart. We now offer e-Visits for anyone 20 and older to request care online for non-urgent symptoms.  For details visit [email or web address].   Also download the MyChart app! Go to the app store, search "MyChart", open the app, select [place], and log in with your MyChart username and password.  Fluorouracil Injection What is this medication? FLUOROURACIL (flure oh YOOR a sil) treats some types of cancer. It works by slowing down the growth of cancer cells. This medicine may be used for other purposes; ask your health care provider or pharmacist if you have questions. COMMON BRAND NAME(S): Adrucil What should I tell my care team before I take this medication? They need to know if you have any of these conditions: Blood disorders Dihydropyrimidine dehydrogenase (DPD) deficiency Infection, such as chickenpox, cold sores, herpes Kidney disease Liver disease Poor nutrition Recent or ongoing radiation therapy An unusual or allergic reaction to fluorouracil, other medications, foods, dyes, or preservatives If you or your partner are pregnant or trying to get pregnant Breast-feeding How should I use this medication? This medication is injected into a vein. It is administered by your care team in a hospital or clinic setting. Talk to your care team about the use of this medication in children. Special care may be needed. Overdosage: If you think you have taken too much of this medicine contact a poison control center or emergency room at once. NOTE: This medicine is only for you. Do not share this medicine with others. What if I miss a dose? Keep appointments for follow-up doses. It is important not to miss your dose. Call your care team if you are unable to keep an appointment. What may interact with this medication? Do not take this medication with any of the following: Live virus vaccines This medication may also interact with the following: Medications that treat or prevent blood clots, such as warfarin, enoxaparin, dalteparin This list may not describe all possible interactions.  Give your health care provider a list of all the medicines, herbs, non-prescription drugs, or dietary supplements you use. Also tell them if you smoke, drink alcohol, or use illegal drugs. Some items may interact with your medicine. What should I watch for while using this medication? Your condition will be monitored carefully while you are receiving this medication. This medication may make you feel generally unwell. This is not uncommon as chemotherapy can affect healthy cells as well as cancer cells. Report any side effects. Continue your course of treatment even though you feel ill unless your care team tells you to stop. In some cases, you may be given additional medications to help with side effects. Follow all directions for their use. This medication may increase your risk of getting an infection. Call your care team for advice if you get a fever, chills, sore throat, or other symptoms of a cold or flu. Do not treat yourself. Try to avoid being around people who are sick. This medication may increase your risk to bruise or bleed. Call your care team if you notice any unusual bleeding. Be careful brushing or flossing your teeth or using a toothpick because you may get an infection  or bleed more easily. If you have any dental work done, tell your dentist you are receiving this medication. Avoid taking medications that contain aspirin, acetaminophen, ibuprofen, naproxen, or ketoprofen unless instructed by your care team. These medications may hide a fever. Do not treat diarrhea with over the counter products. Contact your care team if you have diarrhea that lasts more than 2 days or if it is severe and watery. This medication can make you more sensitive to the sun. Keep out of the sun. If you cannot avoid being in the sun, wear protective clothing and sunscreen. Do not use sun lamps, tanning beds, or tanning booths. Talk to your care team if you or your partner wish to become pregnant or think you  might be pregnant. This medication can cause serious birth defects if taken during pregnancy and for 3 months after the last dose. A reliable form of contraception is recommended while taking this medication and for 3 months after the last dose. Talk to your care team about effective forms of contraception. Do not father a child while taking this medication and for 3 months after the last dose. Use a condom while having sex during this time period. Do not breastfeed while taking this medication. This medication may cause infertility. Talk to your care team if you are concerned about your fertility. What side effects may I notice from receiving this medication? Side effects that you should report to your care team as soon as possible: Allergic reactions--skin rash, itching, hives, swelling of the face, lips, tongue, or throat Heart attack--pain or tightness in the chest, shoulders, arms, or jaw, nausea, shortness of breath, cold or clammy skin, feeling faint or lightheaded Heart failure--shortness of breath, swelling of the ankles, feet, or hands, sudden weight gain, unusual weakness or fatigue Heart rhythm changes--fast or irregular heartbeat, dizziness, feeling faint or lightheaded, chest pain, trouble breathing High ammonia level--unusual weakness or fatigue, confusion, loss of appetite, nausea, vomiting, seizures Infection--fever, chills, cough, sore throat, wounds that don't heal, pain or trouble when passing urine, general feeling of discomfort or being unwell Low red blood cell level--unusual weakness or fatigue, dizziness, headache, trouble breathing Pain, tingling, or numbness in the hands or feet, muscle weakness, change in vision, confusion or trouble speaking, loss of balance or coordination, trouble walking, seizures Redness, swelling, and blistering of the skin over hands and feet Severe or prolonged diarrhea Unusual bruising or bleeding Side effects that usually do not require medical  attention (report to your care team if they continue or are bothersome): Dry skin Headache Increased tears Nausea Pain, redness, or swelling with sores inside the mouth or throat Sensitivity to light Vomiting This list may not describe all possible side effects. Call your doctor for medical advice about side effects. You may report side effects to FDA at 1-800-FDA-1088. Where should I keep my medication? This medication is given in a hospital or clinic. It will not be stored at home. NOTE: This sheet is a summary. It may not cover all possible information. If you have questions about this medicine, talk to your doctor, pharmacist, or health care provider.  2024 Elsevier/Gold Standard (2021-05-29 00:00:00)

## 2022-11-16 ENCOUNTER — Other Ambulatory Visit: Payer: Self-pay

## 2022-11-16 ENCOUNTER — Inpatient Hospital Stay: Payer: Medicare Other

## 2022-11-16 VITALS — BP 116/53 | HR 95 | Temp 98.5°F | Resp 15

## 2022-11-16 DIAGNOSIS — C155 Malignant neoplasm of lower third of esophagus: Secondary | ICD-10-CM

## 2022-11-16 DIAGNOSIS — Z5111 Encounter for antineoplastic chemotherapy: Secondary | ICD-10-CM | POA: Diagnosis not present

## 2022-11-16 MED ORDER — HEPARIN SOD (PORK) LOCK FLUSH 100 UNIT/ML IV SOLN
500.0000 [IU] | Freq: Once | INTRAVENOUS | Status: AC | PRN
  Administered 2022-11-16: 500 [IU]

## 2022-11-16 MED ORDER — SODIUM CHLORIDE 0.9% FLUSH
10.0000 mL | INTRAVENOUS | Status: DC | PRN
  Administered 2022-11-16: 10 mL

## 2022-11-21 ENCOUNTER — Encounter: Payer: Self-pay | Admitting: Nurse Practitioner

## 2022-11-24 ENCOUNTER — Other Ambulatory Visit: Payer: Self-pay | Admitting: Oncology

## 2022-11-24 DIAGNOSIS — C155 Malignant neoplasm of lower third of esophagus: Secondary | ICD-10-CM

## 2022-11-27 ENCOUNTER — Inpatient Hospital Stay: Payer: Medicare Other

## 2022-11-27 ENCOUNTER — Encounter: Payer: Self-pay | Admitting: Oncology

## 2022-11-27 ENCOUNTER — Inpatient Hospital Stay (HOSPITAL_BASED_OUTPATIENT_CLINIC_OR_DEPARTMENT_OTHER): Payer: Medicare Other | Admitting: Oncology

## 2022-11-27 ENCOUNTER — Encounter: Payer: Self-pay | Admitting: *Deleted

## 2022-11-27 VITALS — BP 135/65 | HR 75 | Resp 18

## 2022-11-27 VITALS — BP 122/70 | HR 79 | Temp 97.9°F | Resp 18 | Ht 65.0 in | Wt 135.8 lb

## 2022-11-27 DIAGNOSIS — Z5111 Encounter for antineoplastic chemotherapy: Secondary | ICD-10-CM | POA: Diagnosis not present

## 2022-11-27 DIAGNOSIS — C155 Malignant neoplasm of lower third of esophagus: Secondary | ICD-10-CM

## 2022-11-27 LAB — CBC WITH DIFFERENTIAL (CANCER CENTER ONLY)
Abs Immature Granulocytes: 0.01 10*3/uL (ref 0.00–0.07)
Basophils Absolute: 0 10*3/uL (ref 0.0–0.1)
Basophils Relative: 0 %
Eosinophils Absolute: 0 10*3/uL (ref 0.0–0.5)
Eosinophils Relative: 0 %
HCT: 39.4 % (ref 36.0–46.0)
Hemoglobin: 13.9 g/dL (ref 12.0–15.0)
Immature Granulocytes: 0 %
Lymphocytes Relative: 25 %
Lymphs Abs: 1.4 10*3/uL (ref 0.7–4.0)
MCH: 32.9 pg (ref 26.0–34.0)
MCHC: 35.3 g/dL (ref 30.0–36.0)
MCV: 93.1 fL (ref 80.0–100.0)
Monocytes Absolute: 0.5 10*3/uL (ref 0.1–1.0)
Monocytes Relative: 8 %
Neutro Abs: 3.7 10*3/uL (ref 1.7–7.7)
Neutrophils Relative %: 67 %
Platelet Count: 158 10*3/uL (ref 150–400)
RBC: 4.23 MIL/uL (ref 3.87–5.11)
RDW: 15.6 % — ABNORMAL HIGH (ref 11.5–15.5)
WBC Count: 5.6 10*3/uL (ref 4.0–10.5)
nRBC: 0 % (ref 0.0–0.2)

## 2022-11-27 LAB — CEA (ACCESS): CEA (CHCC): 13.57 ng/mL — ABNORMAL HIGH (ref 0.00–5.00)

## 2022-11-27 LAB — CMP (CANCER CENTER ONLY)
ALT: 11 U/L (ref 0–44)
AST: 16 U/L (ref 15–41)
Albumin: 4.3 g/dL (ref 3.5–5.0)
Alkaline Phosphatase: 92 U/L (ref 38–126)
Anion gap: 6 (ref 5–15)
BUN: 7 mg/dL — ABNORMAL LOW (ref 8–23)
CO2: 28 mmol/L (ref 22–32)
Calcium: 10 mg/dL (ref 8.9–10.3)
Chloride: 98 mmol/L (ref 98–111)
Creatinine: 1.01 mg/dL — ABNORMAL HIGH (ref 0.44–1.00)
GFR, Estimated: 58 mL/min — ABNORMAL LOW (ref >60)
Glucose, Bld: 120 mg/dL — ABNORMAL HIGH (ref 70–99)
Potassium: 4.1 mmol/L (ref 3.5–5.1)
Sodium: 132 mmol/L — ABNORMAL LOW (ref 135–145)
Total Bilirubin: 0.6 mg/dL (ref 0.3–1.2)
Total Protein: 7 g/dL (ref 6.5–8.1)

## 2022-11-27 MED ORDER — FLUOROURACIL CHEMO INJECTION 5 GM/100ML
1800.0000 mg/m2 | INTRAVENOUS | Status: DC
  Administered 2022-11-27: 3000 mg via INTRAVENOUS
  Filled 2022-11-27: qty 60

## 2022-11-27 NOTE — Progress Notes (Signed)
[facility] OFFICE PROGRESS NOTE   Diagnosis: Esophagus Garner  INTERVAL HISTORY:   [facility] returns as scheduled.  She completed another cycle of 5-FU 11/14/2022.  No mouth sores or nausea.  She has irritable bowel syndrome, but no frank diarrhea.  She reports numbness in the right toes and tingling in the left toes.  No dysphagia.  Objective:  Vital signs in last 24 hours:  Blood pressure 122/70, pulse 79, temperature 97.9 F (36.6 C), resp. rate 18, height 5\' 5"  (1.651 m), weight 135 lb 12.8 oz (61.6 kg), SpO2 100%.    HEENT: Hyperpigmentation, no thrush or ulcers Resp: Distant breath sounds, no respiratory distress, end inspiratory coarse rhonchi at the left posterior base Cardio: Regular rate and rhythm, distant heart sounds GI: No hepatosplenomegaly Vascular: No leg edema  Skin: Palms without erythema  Portacath/PICC-without erythema  Lab Results:  Lab Results  Component Value Date   WBC 5.6 11/27/2022   HGB 13.9 11/27/2022   HCT 39.4 11/27/2022   MCV 93.1 11/27/2022   PLT 158 11/27/2022   NEUTROABS 3.7 11/27/2022    CMP  Lab Results  Component Value Date   NA 132 (L) 11/27/2022   K 4.1 11/27/2022   CL 98 11/27/2022   CO2 28 11/27/2022   GLUCOSE 120 (H) 11/27/2022   BUN 7 (L) 11/27/2022   CREATININE 1.01 (H) 11/27/2022   CALCIUM 10.0 11/27/2022   PROT 7.0 11/27/2022   ALBUMIN 4.3 11/27/2022   AST 16 11/27/2022   ALT 11 11/27/2022   ALKPHOS 92 11/27/2022   BILITOT 0.6 11/27/2022   GFRNONAA 58 (L) 11/27/2022    Lab Results  Component Value Date   CEA 13.57 (H) 11/27/2022    No results found for: "INR", "LABPROT"  Imaging:  No results found.  Medications: I have reviewed the patient's current medications.   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, HER2 negative, no loss of mismatch repair protein expression, PD-L1 combined positive score less than 1% CT chest 12/14/2020-accentuated density at the distal esophageal lumen above a small type I hiatal hernia concerning for malignancy, nonpathologically enlarged AP window and right hilar nodes PET 12/21/2020-abnormal FDG uptake at the distal esophagus, no evidence of nodal or distant metastatic disease Radiation 01/08/2021-02/16/2021 Cycle 1 weekly Taxol/carboplatin 01/08/2021 Cycle 2 weekly Taxol/carboplatin 01/15/2021 Treatment held 01/22/2021 due to neutropenia Positive COVID test 02/02/2021 Taxol/carboplatin 02/15/2021 Endoscopy 04/30/2021-shallow ulceration at the distal esophagus, nodularity at the gastric cardia-biopsies negative for malignancy CTs 04/02/2022-small mediastinal and right hilar lymph nodes.  Esophagus is mildly patulous decreased from previous in the mid to upper aspect of the mediastinum.  More distally the wall thickening of the esophagus appears slightly decreased.  Small hiatal hernia.  The area of thickening does correspond to the abnormality on PET-CT.  Emphysema. Endoscopy 04/15/2022-Short segment salmon-colored mucosa 1 cm above the Z-line.  Scarring at the gastroesophageal junction/Z-line with nonobstructing narrowing.  Nodular mucosa in the gastric cardia immediately distal to the Z-line.  Pathology on GE junction nodule-invasive moderately differentiated adenocarcinoma, tumor arises within a background of high-grade dysplasia, mild chronic active gastritis with foveolar hyperplasia, negative for squamous epithelium and intestinal metaplasia.  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  Negative for HER2 by IHC, PD-L1 CPS 2% PET scan 04/25/2022-increased  radiotracer uptake associated with the distal esophagus at the level of the GE junction.   No evidence of nodal metastasis or distant metastatic disease. Cycle 1 FOLFOX 06/04/2022 Cycle 2 FOLFOX 06/18/2022, oxaliplatin dose reduced secondary thrombocytopenia Cycle 3 FOLFOX 06/24/2022, oxaliplatin dose reduced secondary thrombocytopenia, 5-FU dose reduced secondary to hand/foot syndrome 07/16/2022 chemotherapy held due to thrombocytopenia, dehydration Cycle 4 FOLFOX 07/23/2022, oxaliplatin dose reduced due to thrombocytopenia Cycle 5 FOLFOX 08/05/2022 CT chest 08/13/2022-mild focal wall thickening at the distal esophagus, 8 mm gastropathic node-not FDG avid on PET, unchanged small mediastinal nodes no evidence of metastatic disease in the chest Upper endoscopy 08/14/2022-benign-appearing mild stenosis in the distal esophagus-microscopic focus of adenocarcinoma Cycle 6 FOLFOX 09/04/2022 Maintenance 5-fluorouracil via pump every 2 weeks beginning 10/30/2022 5-fluorouracil infusion 11/14/2022 5-FU infusion infusion 11/27/2022 Solid dysphagia secondary #1, resolved COPD on chest CT 12/14/2020 Hemorrhoids Hyperlipidemia Irritable bowel syndrome Hypothyroidism Tobacco use Thrombocytopenia secondary to chemotherapy Hand/foot syndrome-5-FU dose reduced with cycle 3 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  There is no clinical evidence for progression of esophagus Garner.  She will continue 5-FU maintenance.  We will plan for a restaging chest CT and endoscopy within the next few months.  Foot neuropathy symptoms are likely related to previous oxaliplatin and potentially complicated by the recent right leg fracture.  Thornton Papas, MD  11/27/2022  11:38 AM

## 2022-11-27 NOTE — Patient Instructions (Signed)
[facility]   Discharge Instructions: Thank you for choosing [facility] to provide your oncology and hematology care.   If you have a lab appointment with the Cancer Center, please go directly to the Cancer Center and check in at the registration area.   Wear comfortable clothing and clothing appropriate for easy access to any Portacath or PICC line.   We strive to give you quality time with your provider. You may need to reschedule your appointment if you arrive late (15 or more minutes).  Arriving late affects you and other patients whose appointments are after yours.  Also, if you miss three or more appointments without notifying the office, you may be dismissed from the clinic at the provider's discretion.      For prescription refill requests, have your pharmacy contact our office and allow 72 hours for refills to be completed.    Today you received the following chemotherapy and/or immunotherapy agents Flourouracil (ADRUCIL).      To help prevent nausea and vomiting after your treatment, we encourage you to take your nausea medication as directed.  BELOW ARE SYMPTOMS THAT SHOULD BE REPORTED IMMEDIATELY: *FEVER GREATER THAN 100.4 F (38 C) OR HIGHER *CHILLS OR SWEATING *NAUSEA AND VOMITING THAT IS NOT CONTROLLED WITH YOUR NAUSEA MEDICATION *UNUSUAL SHORTNESS OF BREATH *UNUSUAL BRUISING OR BLEEDING *URINARY PROBLEMS (pain or burning when urinating, or frequent urination) *BOWEL PROBLEMS (unusual diarrhea, constipation, pain near the anus) TENDERNESS IN MOUTH AND THROAT WITH OR WITHOUT PRESENCE OF ULCERS (sore throat, sores in mouth, or a toothache) UNUSUAL RASH, SWELLING OR PAIN  UNUSUAL VAGINAL DISCHARGE OR ITCHING   Items with * indicate a potential emergency and should be followed up as soon as possible or go to the Emergency Department if any problems should occur.  Please show the CHEMOTHERAPY ALERT CARD or IMMUNOTHERAPY ALERT  CARD at check-in to the Emergency Department and triage nurse.  Should you have questions after your visit or need to cancel or reschedule your appointment, please contact [facility]  Dept: [phone]  and follow the prompts.  Office hours are 8:00 a.m. to 4:30 p.m. Monday - Friday. Please note that voicemails left after 4:00 p.m. may not be returned until the following business day.  We are closed weekends and major holidays. You have access to a nurse at all times for urgent questions. Please call the main number to the clinic Dept: [phone] and follow the prompts.   For any non-urgent questions, you may also contact your provider using MyChart. We now offer e-Visits for anyone 54 and older to request care online for non-urgent symptoms. For details visit [email or web address].   Also download the MyChart app! Go to the app store, search "MyChart", open the app, select [facility] [place], and log in with your MyChart username and password.  Fluorouracil Injection What is this medication? FLUOROURACIL (flure oh YOOR a sil) treats some types of cancer. It works by slowing down the growth of cancer cells. This medicine may be used for other purposes; ask your health care provider or pharmacist if you have questions. COMMON BRAND NAME(S): Adrucil What should I tell my care team before I take this medication? They need to know if you have any of these conditions: Blood disorders Dihydropyrimidine dehydrogenase (DPD) deficiency Infection, such as chickenpox, cold sores, herpes Kidney disease Liver disease Poor nutrition Recent or ongoing radiation therapy An unusual or allergic reaction to fluorouracil, other medications,  foods, dyes, or preservatives If you or your partner are pregnant or trying to get pregnant Breast-feeding How should I use this medication? This medication is injected into a vein. It is administered by your care team in a hospital or  clinic setting. Talk to your care team about the use of this medication in children. Special care may be needed. Overdosage: If you think you have taken too much of this medicine contact a poison control center or emergency room at once. NOTE: This medicine is only for you. Do not share this medicine with others. What if I miss a dose? Keep appointments for follow-up doses. It is important not to miss your dose. Call your care team if you are unable to keep an appointment. What may interact with this medication? Do not take this medication with any of the following: Live virus vaccines This medication may also interact with the following: Medications that treat or prevent blood clots, such as warfarin, enoxaparin, dalteparin This list may not describe all possible interactions. Give your health care provider a list of all the medicines, herbs, non-prescription drugs, or dietary supplements you use. Also tell them if you smoke, drink alcohol, or use illegal drugs. Some items may interact with your medicine. What should I watch for while using this medication? Your condition will be monitored carefully while you are receiving this medication. This medication may make you feel generally unwell. This is not uncommon as chemotherapy can affect healthy cells as well as cancer cells. Report any side effects. Continue your course of treatment even though you feel ill unless your care team tells you to stop. In some cases, you may be given additional medications to help with side effects. Follow all directions for their use. This medication may increase your risk of getting an infection. Call your care team for advice if you get a fever, chills, sore throat, or other symptoms of a cold or flu. Do not treat yourself. Try to avoid being around people who are sick. This medication may increase your risk to bruise or bleed. Call your care team if you notice any unusual bleeding. Be careful brushing or flossing  your teeth or using a toothpick because you may get an infection or bleed more easily. If you have any dental work done, tell your dentist you are receiving this medication. Avoid taking medications that contain aspirin, acetaminophen, ibuprofen, naproxen, or ketoprofen unless instructed by your care team. These medications may hide a fever. Do not treat diarrhea with over the counter products. Contact your care team if you have diarrhea that lasts more than 2 days or if it is severe and watery. This medication can make you more sensitive to the sun. Keep out of the sun. If you cannot avoid being in the sun, wear protective clothing and sunscreen. Do not use sun lamps, tanning beds, or tanning booths. Talk to your care team if you or your partner wish to become pregnant or think you might be pregnant. This medication can cause serious birth defects if taken during pregnancy and for 3 months after the last dose. A reliable form of contraception is recommended while taking this medication and for 3 months after the last dose. Talk to your care team about effective forms of contraception. Do not father a child while taking this medication and for 3 months after the last dose. Use a condom while having sex during this time period. Do not breastfeed while taking this medication. This medication may cause infertility.  Talk to your care team if you are concerned about your fertility. What side effects may I notice from receiving this medication? Side effects that you should report to your care team as soon as possible: Allergic reactions--skin rash, itching, hives, swelling of the face, lips, tongue, or throat Heart attack--pain or tightness in the chest, shoulders, arms, or jaw, nausea, shortness of breath, cold or clammy skin, feeling faint or lightheaded Heart failure--shortness of breath, swelling of the ankles, feet, or hands, sudden weight gain, unusual weakness or fatigue Heart rhythm changes--fast or  irregular heartbeat, dizziness, feeling faint or lightheaded, chest pain, trouble breathing High ammonia level--unusual weakness or fatigue, confusion, loss of appetite, nausea, vomiting, seizures Infection--fever, chills, cough, sore throat, wounds that don't heal, pain or trouble when passing urine, general feeling of discomfort or being unwell Low red blood cell level--unusual weakness or fatigue, dizziness, headache, trouble breathing Pain, tingling, or numbness in the hands or feet, muscle weakness, change in vision, confusion or trouble speaking, loss of balance or coordination, trouble walking, seizures Redness, swelling, and blistering of the skin over hands and feet Severe or prolonged diarrhea Unusual bruising or bleeding Side effects that usually do not require medical attention (report to your care team if they continue or are bothersome): Dry skin Headache Increased tears Nausea Pain, redness, or swelling with sores inside the mouth or throat Sensitivity to light Vomiting This list may not describe all possible side effects. Call your doctor for medical advice about side effects. You may report side effects to FDA at 1-800-FDA-1088. Where should I keep my medication? This medication is given in a hospital or clinic. It will not be stored at home. NOTE: This sheet is a summary. It may not cover all possible information. If you have questions about this medicine, talk to your doctor, pharmacist, or health care provider.  2024 Elsevier/Gold Standard (2021-05-29 00:00:00)  The chemotherapy medication bag should finish at 46 hours, 96 hours, or 7 days. For example, if your pump is scheduled for 46 hours and it was put on at 4:00 p.m., it should finish at 2:00 p.m. the day it is scheduled to come off regardless of your appointment time.     Estimated time to finish at 11:15 a.m. on Friday 11/29/2022.   If the display on your pump reads "Low Volume" and it is beeping, take the  batteries out of the pump and come to the cancer center for it to be taken off.   If the pump alarms go off prior to the pump reading "Low Volume" then call [phone] and someone can assist you.  If the plunger comes out and the chemotherapy medication is leaking out, please use your home chemo spill kit to clean up the spill. Do NOT use paper towels or other household products.  If you have problems or questions regarding your pump, please call either [phone] (24 hours a day) or the cancer center Monday-Friday 8:00 a.m.- 4:30 p.m. at the clinic number and we will assist you. If you are unable to get assistance, then go to the nearest Emergency Department and ask the staff to contact the IV team for assistance.

## 2022-11-27 NOTE — Progress Notes (Signed)
Patient seen by Dr. Thornton Papas today  Vitals are within treatment parameters:Yes   Labs are within treatment parameters: Yes   Treatment plan has been signed: Yes   Per physician team, Patient is ready for treatment and there are NO modifications to the treatment plan.

## 2022-11-28 ENCOUNTER — Other Ambulatory Visit: Payer: Self-pay

## 2022-11-29 ENCOUNTER — Inpatient Hospital Stay: Payer: Medicare Other

## 2022-11-29 VITALS — BP 115/52 | HR 88 | Temp 97.7°F | Resp 18

## 2022-11-29 DIAGNOSIS — C155 Malignant neoplasm of lower third of esophagus: Secondary | ICD-10-CM

## 2022-11-29 DIAGNOSIS — Z5111 Encounter for antineoplastic chemotherapy: Secondary | ICD-10-CM | POA: Diagnosis not present

## 2022-11-29 MED ORDER — SODIUM CHLORIDE 0.9% FLUSH
10.0000 mL | INTRAVENOUS | Status: DC | PRN
  Administered 2022-11-29: 10 mL

## 2022-11-29 MED ORDER — HEPARIN SOD (PORK) LOCK FLUSH 100 UNIT/ML IV SOLN
500.0000 [IU] | Freq: Once | INTRAVENOUS | Status: AC | PRN
  Administered 2022-11-29: 500 [IU]

## 2022-11-29 NOTE — Patient Instructions (Signed)

## 2022-12-09 ENCOUNTER — Encounter: Payer: Self-pay | Admitting: Oncology

## 2022-12-11 ENCOUNTER — Inpatient Hospital Stay (HOSPITAL_BASED_OUTPATIENT_CLINIC_OR_DEPARTMENT_OTHER): Payer: Medicare Other | Admitting: Oncology

## 2022-12-11 ENCOUNTER — Inpatient Hospital Stay: Payer: Medicare Other

## 2022-12-11 ENCOUNTER — Encounter: Payer: Self-pay | Admitting: *Deleted

## 2022-12-11 ENCOUNTER — Inpatient Hospital Stay: Payer: Medicare Other | Attending: Oncology

## 2022-12-11 ENCOUNTER — Other Ambulatory Visit: Payer: Self-pay | Admitting: *Deleted

## 2022-12-11 ENCOUNTER — Other Ambulatory Visit (HOSPITAL_BASED_OUTPATIENT_CLINIC_OR_DEPARTMENT_OTHER): Payer: Self-pay

## 2022-12-11 VITALS — BP 118/60 | HR 86 | Temp 97.7°F | Resp 18 | Ht 65.0 in | Wt 136.2 lb

## 2022-12-11 DIAGNOSIS — J449 Chronic obstructive pulmonary disease, unspecified: Secondary | ICD-10-CM | POA: Insufficient documentation

## 2022-12-11 DIAGNOSIS — E039 Hypothyroidism, unspecified: Secondary | ICD-10-CM | POA: Diagnosis not present

## 2022-12-11 DIAGNOSIS — Z5111 Encounter for antineoplastic chemotherapy: Secondary | ICD-10-CM | POA: Insufficient documentation

## 2022-12-11 DIAGNOSIS — G620 Drug-induced polyneuropathy: Secondary | ICD-10-CM | POA: Insufficient documentation

## 2022-12-11 DIAGNOSIS — D6959 Other secondary thrombocytopenia: Secondary | ICD-10-CM | POA: Diagnosis not present

## 2022-12-11 DIAGNOSIS — K649 Unspecified hemorrhoids: Secondary | ICD-10-CM | POA: Insufficient documentation

## 2022-12-11 DIAGNOSIS — C155 Malignant neoplasm of lower third of esophagus: Secondary | ICD-10-CM | POA: Diagnosis not present

## 2022-12-11 DIAGNOSIS — E785 Hyperlipidemia, unspecified: Secondary | ICD-10-CM | POA: Insufficient documentation

## 2022-12-11 LAB — CBC WITH DIFFERENTIAL (CANCER CENTER ONLY)
Abs Immature Granulocytes: 0.01 10*3/uL (ref 0.00–0.07)
Basophils Absolute: 0 10*3/uL (ref 0.0–0.1)
Basophils Relative: 0 %
Eosinophils Absolute: 0 10*3/uL (ref 0.0–0.5)
Eosinophils Relative: 1 %
HCT: 39.2 % (ref 36.0–46.0)
Hemoglobin: 13.6 g/dL (ref 12.0–15.0)
Immature Granulocytes: 0 %
Lymphocytes Relative: 32 %
Lymphs Abs: 1.6 10*3/uL (ref 0.7–4.0)
MCH: 32.5 pg (ref 26.0–34.0)
MCHC: 34.7 g/dL (ref 30.0–36.0)
MCV: 93.6 fL (ref 80.0–100.0)
Monocytes Absolute: 0.4 10*3/uL (ref 0.1–1.0)
Monocytes Relative: 8 %
Neutro Abs: 3.1 10*3/uL (ref 1.7–7.7)
Neutrophils Relative %: 59 %
Platelet Count: 155 10*3/uL (ref 150–400)
RBC: 4.19 MIL/uL (ref 3.87–5.11)
RDW: 16.4 % — ABNORMAL HIGH (ref 11.5–15.5)
WBC Count: 5.2 10*3/uL (ref 4.0–10.5)
nRBC: 0 % (ref 0.0–0.2)

## 2022-12-11 LAB — CMP (CANCER CENTER ONLY)
ALT: 12 U/L (ref 0–44)
AST: 16 U/L (ref 15–41)
Albumin: 4.3 g/dL (ref 3.5–5.0)
Alkaline Phosphatase: 87 U/L (ref 38–126)
Anion gap: 7 (ref 5–15)
BUN: 11 mg/dL (ref 8–23)
CO2: 26 mmol/L (ref 22–32)
Calcium: 9.9 mg/dL (ref 8.9–10.3)
Chloride: 100 mmol/L (ref 98–111)
Creatinine: 0.97 mg/dL (ref 0.44–1.00)
GFR, Estimated: >60 mL/min (ref >60)
Glucose, Bld: 93 mg/dL (ref 70–99)
Potassium: 4 mmol/L (ref 3.5–5.1)
Sodium: 133 mmol/L — ABNORMAL LOW (ref 135–145)
Total Bilirubin: 0.5 mg/dL (ref <1.2)
Total Protein: 7.2 g/dL (ref 6.5–8.1)

## 2022-12-11 MED ORDER — FLUOROURACIL CHEMO INJECTION 5 GM/100ML
1800.0000 mg/m2 | INTRAVENOUS | Status: DC
  Administered 2022-12-11: 3000 mg via INTRAVENOUS
  Filled 2022-12-11: qty 60

## 2022-12-11 MED ORDER — POTASSIUM CHLORIDE CRYS ER 20 MEQ PO TBCR: 20.0000 meq | EXTENDED_RELEASE_TABLET | Freq: Every day | ORAL | 0 refills | Status: AC

## 2022-12-11 NOTE — Progress Notes (Signed)
Patient seen by Dr. Thornton Papas today  Vitals are within treatment parameters:Yes   Labs are within treatment parameters: Yes   Treatment plan has been signed: Yes   Per physician team, Patient is ready for treatment and there are NO modifications to the treatment plan.

## 2022-12-11 NOTE — Progress Notes (Signed)
[facility] OFFICE PROGRESS NOTE   Diagnosis: Esophagus cancer  INTERVAL HISTORY:   Ms. Wherry complete another cycle of 5-fluorouracil 11/27/2022.  No mouth sores.  She had diarrhea after taking Macrobid.  She is currently completing a course of cephalexin for a urinary tract infection.  No dysphagia.  Objective:  Vital signs in last 24 hours:  Blood pressure 118/60, pulse 86, temperature 97.7 F (36.5 C), temperature source Temporal, resp. rate 18, height 5\' 5"  (1.651 m), weight 136 lb 3.2 oz (61.8 kg), SpO2 100%.    HEENT: No thrush or ulcers Resp: Distant breath sounds, lungs clear bilaterally Cardio: Regular rate and rhythm GI: No hepatosplenomegaly Vascular: No leg edema  Skin: Palms without erythema  Portacath/PICC-without erythema  Lab Results:  Lab Results  Component Value Date   WBC 5.2 12/11/2022   HGB 13.6 12/11/2022   HCT 39.2 12/11/2022   MCV 93.6 12/11/2022   PLT 155 12/11/2022   NEUTROABS 3.1 12/11/2022    CMP  Lab Results  Component Value Date   NA 133 (L) 12/11/2022   K 4.0 12/11/2022   CL 100 12/11/2022   CO2 26 12/11/2022   GLUCOSE 93 12/11/2022   BUN 11 12/11/2022   CREATININE 0.97 12/11/2022   CALCIUM 9.9 12/11/2022   PROT 7.2 12/11/2022   ALBUMIN 4.3 12/11/2022   AST 16 12/11/2022   ALT 12 12/11/2022   ALKPHOS 87 12/11/2022   BILITOT 0.5 12/11/2022   GFRNONAA >60 12/11/2022    Lab Results  Component Value Date   CEA 13.57 (H) 11/27/2022    Medications: I have reviewed the patient's current medications.   Assessment/Plan: Distal esophagus cancer Esophagram 11/21/2020-moderate to severe esophageal dysmotility, narrowing of the distal esophagus just above the GE junction to 4 mm, 13 mm barium tablet stuck in the distal esophagus Upper endoscopy 12/13/2020-severe esophagitis with nodularity and stricturing at 36 cm-adenocarcinoma, HER2 negative, no loss of mismatch repair protein expression, PD-L1 combined  positive score less than 1% CT chest 12/14/2020-accentuated density at the distal esophageal lumen above a small type I hiatal hernia concerning for malignancy, nonpathologically enlarged AP window and right hilar nodes PET 12/21/2020-abnormal FDG uptake at the distal esophagus, no evidence of nodal or distant metastatic disease Radiation 01/08/2021-02/16/2021 Cycle 1 weekly Taxol/carboplatin 01/08/2021 Cycle 2 weekly Taxol/carboplatin 01/15/2021 Treatment held 01/22/2021 due to neutropenia Positive COVID test 02/02/2021 Taxol/carboplatin 02/15/2021 Endoscopy 04/30/2021-shallow ulceration at the distal esophagus, nodularity at the gastric cardia-biopsies negative for malignancy CTs 04/02/2022-small mediastinal and right hilar lymph nodes.  Esophagus is mildly patulous decreased from previous in the mid to upper aspect of the mediastinum.  More distally the wall thickening of the esophagus appears slightly decreased.  Small hiatal hernia.  The area of thickening does correspond to the abnormality on PET-CT.  Emphysema. Endoscopy 04/15/2022-Short segment salmon-colored mucosa 1 cm above the Z-line.  Scarring at the gastroesophageal junction/Z-line with nonobstructing narrowing.  Nodular mucosa in the gastric cardia immediately distal to the Z-line.  Pathology on GE junction nodule-invasive moderately differentiated adenocarcinoma, tumor arises within a background of high-grade dysplasia, mild chronic active gastritis with foveolar hyperplasia, negative for squamous epithelium and intestinal metaplasia.  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  Negative for HER2 by IHC, PD-L1 CPS 2% PET scan 04/25/2022-increased radiotracer uptake associated with the distal esophagus at the level of the GE junction.  No evidence of nodal metastasis or distant metastatic disease. Cycle 1 FOLFOX 06/04/2022 Cycle 2 FOLFOX 06/18/2022, oxaliplatin dose reduced secondary thrombocytopenia Cycle  3 FOLFOX  06/24/2022, oxaliplatin dose reduced secondary thrombocytopenia, 5-FU dose reduced secondary to hand/foot syndrome 07/16/2022 chemotherapy held due to thrombocytopenia, dehydration Cycle 4 FOLFOX 07/23/2022, oxaliplatin dose reduced due to thrombocytopenia Cycle 5 FOLFOX 08/05/2022 CT chest 08/13/2022-mild focal wall thickening at the distal esophagus, 8 mm gastropathic node-not FDG avid on PET, unchanged small mediastinal nodes no evidence of metastatic disease in the chest Upper endoscopy 08/14/2022-benign-appearing mild stenosis in the distal esophagus-microscopic focus of adenocarcinoma Cycle 6 FOLFOX 09/04/2022 Maintenance 5-fluorouracil via pump every 2 weeks beginning 10/30/2022 5-fluorouracil infusion 11/14/2022 5-FU  infusion 11/27/2022 5-FU infusion 12/11/2022 Solid dysphagia secondary #1, resolved COPD on chest CT 12/14/2020 Hemorrhoids Hyperlipidemia Irritable bowel syndrome Hypothyroidism Tobacco use Thrombocytopenia secondary to chemotherapy Hand/foot syndrome-5-FU dose reduced with cycle 3 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 Kossman appears stable.  She is tolerating the 5-fluorouracil well.  She will complete another cycle today.  The plan is to complete 2 more cycles of 5-FU prior to a restaging evaluation.  We will refer her to Dr. Rhea Belton for a restaging endoscopy and she will be scheduled for a chest CT in December.  Thornton Papas, MD  12/11/2022  11:35 AM

## 2022-12-12 ENCOUNTER — Telehealth: Payer: Self-pay

## 2022-12-12 ENCOUNTER — Other Ambulatory Visit: Payer: Self-pay

## 2022-12-12 DIAGNOSIS — Z8501 Personal history of malignant neoplasm of esophagus: Secondary | ICD-10-CM

## 2022-12-12 DIAGNOSIS — C155 Malignant neoplasm of lower third of esophagus: Secondary | ICD-10-CM

## 2022-12-12 NOTE — Telephone Encounter (Signed)
Pt scheduled for EGD in the [facility] with Dr. Rhea Belton 02/12/23 at 10am, pt to arrive at 9am. Instructions sent to pt via mychart. Amb ref entered in epic.

## 2022-12-12 NOTE — Telephone Encounter (Signed)
-----   Message from Nurse Jerral Ralph sent at 12/12/2022 11:10 AM EST ----- Regarding: RE: Repeat Endoscopy Dr Truett Perna said 1/8 would be fine.  Thanks Bonita Quin ----- Message ----- From: Chrystie Nose, RN Sent: 12/12/2022  11:02 AM EST To: Ardell Isaacs, RN; Beverley Fiedler, MD Subject: RE: Repeat Endoscopy                           Dr. Rhea Belton can do the EGD 02/12/23 for this pt. We were going to get Dr. Leone Payor to do the procedure as he had availability in December. Pt states she thinks Jan would be fine and she would prefer to have Dr. Rhea Belton.   Is January 8 ok for the procedure? ----- Message ----- From: Beverley Fiedler, MD Sent: 12/11/2022   1:54 PM EST To: Ardell Isaacs, RN; Chrystie Nose, RN Subject: RE: Repeat Endoscopy                           Yes Bonita Quin can you add on for [facility] EGD with me in Dec please Thanks JMP ----- Message ----- From: Ardell Isaacs, RN Sent: 12/11/2022  11:55 AM EST To: Chrystie Nose, RN; Beverley Fiedler, MD Subject: Repeat Endoscopy                               Dr Rhea Belton,  Dr Truett Perna saw Ms Nader in clinic this am and wanted to ask if she could have a repeat Endoscopy with you sometime in December?  Thank you,  Marylouise Stacks [facility] Nurse Navigator

## 2022-12-13 ENCOUNTER — Other Ambulatory Visit: Payer: Self-pay

## 2022-12-13 ENCOUNTER — Inpatient Hospital Stay: Payer: Medicare Other

## 2022-12-13 VITALS — BP 132/72 | HR 92 | Temp 98.5°F | Resp 18

## 2022-12-13 DIAGNOSIS — Z5111 Encounter for antineoplastic chemotherapy: Secondary | ICD-10-CM | POA: Diagnosis not present

## 2022-12-13 DIAGNOSIS — C155 Malignant neoplasm of lower third of esophagus: Secondary | ICD-10-CM

## 2022-12-13 MED ORDER — HEPARIN SOD (PORK) LOCK FLUSH 100 UNIT/ML IV SOLN
500.0000 [IU] | Freq: Once | INTRAVENOUS | Status: AC | PRN
  Administered 2022-12-13: 500 [IU]

## 2022-12-13 MED ORDER — SODIUM CHLORIDE 0.9% FLUSH
10.0000 mL | INTRAVENOUS | Status: DC | PRN
  Administered 2022-12-13: 10 mL

## 2022-12-13 NOTE — Patient Instructions (Signed)

## 2022-12-25 ENCOUNTER — Inpatient Hospital Stay: Payer: Medicare Other

## 2022-12-25 ENCOUNTER — Encounter: Payer: Self-pay | Admitting: Oncology

## 2022-12-25 ENCOUNTER — Encounter: Payer: Self-pay | Admitting: Nurse Practitioner

## 2022-12-25 ENCOUNTER — Inpatient Hospital Stay (HOSPITAL_BASED_OUTPATIENT_CLINIC_OR_DEPARTMENT_OTHER): Payer: Medicare Other | Admitting: Nurse Practitioner

## 2022-12-25 VITALS — BP 141/64 | HR 94 | Temp 98.1°F | Resp 18 | Ht 65.0 in | Wt 135.0 lb

## 2022-12-25 DIAGNOSIS — C155 Malignant neoplasm of lower third of esophagus: Secondary | ICD-10-CM

## 2022-12-25 DIAGNOSIS — Z5111 Encounter for antineoplastic chemotherapy: Secondary | ICD-10-CM | POA: Diagnosis not present

## 2022-12-25 LAB — CBC WITH DIFFERENTIAL (CANCER CENTER ONLY)
Abs Immature Granulocytes: 0.01 10*3/uL (ref 0.00–0.07)
Basophils Absolute: 0 10*3/uL (ref 0.0–0.1)
Basophils Relative: 0 %
Eosinophils Absolute: 0.1 10*3/uL (ref 0.0–0.5)
Eosinophils Relative: 2 %
HCT: 38.4 % (ref 36.0–46.0)
Hemoglobin: 13.6 g/dL (ref 12.0–15.0)
Immature Granulocytes: 0 %
Lymphocytes Relative: 33 %
Lymphs Abs: 1.5 10*3/uL (ref 0.7–4.0)
MCH: 33 pg (ref 26.0–34.0)
MCHC: 35.4 g/dL (ref 30.0–36.0)
MCV: 93.2 fL (ref 80.0–100.0)
Monocytes Absolute: 0.4 10*3/uL (ref 0.1–1.0)
Monocytes Relative: 10 %
Neutro Abs: 2.5 10*3/uL (ref 1.7–7.7)
Neutrophils Relative %: 55 %
Platelet Count: 172 10*3/uL (ref 150–400)
RBC: 4.12 MIL/uL (ref 3.87–5.11)
RDW: 17.4 % — ABNORMAL HIGH (ref 11.5–15.5)
WBC Count: 4.5 10*3/uL (ref 4.0–10.5)
nRBC: 0 % (ref 0.0–0.2)

## 2022-12-25 LAB — CMP (CANCER CENTER ONLY)
ALT: 12 U/L (ref 0–44)
AST: 16 U/L (ref 15–41)
Albumin: 4.2 g/dL (ref 3.5–5.0)
Alkaline Phosphatase: 76 U/L (ref 38–126)
Anion gap: 5 (ref 5–15)
BUN: 14 mg/dL (ref 8–23)
CO2: 29 mmol/L (ref 22–32)
Calcium: 9.5 mg/dL (ref 8.9–10.3)
Chloride: 100 mmol/L (ref 98–111)
Creatinine: 0.91 mg/dL (ref 0.44–1.00)
GFR, Estimated: >60 mL/min (ref >60)
Glucose, Bld: 83 mg/dL (ref 70–99)
Potassium: 3.8 mmol/L (ref 3.5–5.1)
Sodium: 134 mmol/L — ABNORMAL LOW (ref 135–145)
Total Bilirubin: 0.8 mg/dL (ref <1.2)
Total Protein: 6.9 g/dL (ref 6.5–8.1)

## 2022-12-25 LAB — CEA (ACCESS): CEA (CHCC): 14.51 ng/mL — ABNORMAL HIGH (ref 0.00–5.00)

## 2022-12-25 MED ORDER — FLUOROURACIL CHEMO INJECTION 5 GM/100ML
1800.0000 mg/m2 | INTRAVENOUS | Status: DC
  Administered 2022-12-25: 3000 mg via INTRAVENOUS
  Filled 2022-12-25: qty 10

## 2022-12-25 MED ORDER — SODIUM CHLORIDE 0.9% FLUSH
10.0000 mL | INTRAVENOUS | Status: DC | PRN
  Administered 2022-12-25: 10 mL

## 2022-12-25 NOTE — Telephone Encounter (Signed)
Telephone call  

## 2022-12-25 NOTE — Patient Instructions (Addendum)
[facility] - A DEPT OF [facility]  Discharge Instructions: The chemotherapy medication bag should finish at 46 hours, 96 hours, or 7 days. For example, if your pump is scheduled for 46 hours and it was put on at 4:00 p.m., it should finish at 2:00 p.m. the day it is scheduled to come off regardless of your appointment time.     Estimated time to finish at 12/27/22,0900   If the display on your pump reads "Low Volume" and it is beeping, take the batteries out of the pump and come to the cancer center for it to be taken off.   If the pump alarms go off prior to the pump reading "Low Volume" then call [phone] and someone can assist you.  If the plunger comes out and the chemotherapy medication is leaking out, please use your home chemo spill kit to clean up the spill. Do NOT use paper towels or other household products.  If you have problems or questions regarding your pump, please call either [phone] (24 hours a day) or the cancer center Monday-Friday 8:00 a.m.- 4:30 p.m. at the clinic number and we will assist you. If you are unable to get assistance, then go to the nearest Emergency Department and ask the staff to contact the IV team for assistance.   Thank you for choosing [facility] to provide your oncology and hematology care.   If you have a lab appointment with the Cancer Center, please go directly to the Cancer Center and check in at the registration area.   Wear comfortable clothing and clothing appropriate for easy access to any Portacath or PICC line.   We strive to give you quality time with your provider. You may need to reschedule your appointment if you arrive late (15 or more minutes).  Arriving late affects you and other patients whose appointments are after yours.  Also, if you miss three or more appointments without notifying the office, you may be dismissed from the clinic at the provider's discretion.      For  prescription refill requests, have your pharmacy contact our office and allow 72 hours for refills to be completed.    Today you received the following chemotherapy and/or immunotherapy agents FLUOROURACIL       To help prevent nausea and vomiting after your treatment, we encourage you to take your nausea medication as directed.  BELOW ARE SYMPTOMS THAT SHOULD BE REPORTED IMMEDIATELY: *FEVER GREATER THAN 100.4 F (38 C) OR HIGHER *CHILLS OR SWEATING *NAUSEA AND VOMITING THAT IS NOT CONTROLLED WITH YOUR NAUSEA MEDICATION *UNUSUAL SHORTNESS OF BREATH *UNUSUAL BRUISING OR BLEEDING *URINARY PROBLEMS (pain or burning when urinating, or frequent urination) *BOWEL PROBLEMS (unusual diarrhea, constipation, pain near the anus) TENDERNESS IN MOUTH AND THROAT WITH OR WITHOUT PRESENCE OF ULCERS (sore throat, sores in mouth, or a toothache) UNUSUAL RASH, SWELLING OR PAIN  UNUSUAL VAGINAL DISCHARGE OR ITCHING   Items with * indicate a potential emergency and should be followed up as soon as possible or go to the Emergency Department if any problems should occur.  Please show the CHEMOTHERAPY ALERT CARD or IMMUNOTHERAPY ALERT CARD at check-in to the Emergency Department and triage nurse.  Should you have questions after your visit or need to cancel or reschedule your appointment, please contact [facility] - A DEPT OF [facility]  Dept: [phone]  and follow the prompts.  Office hours are 8:00 a.m. to  4:30 p.m. Monday - Friday. Please note that voicemails left after 4:00 p.m. may not be returned until the following business day.  We are closed weekends and major holidays. You have access to a nurse at all times for urgent questions. Please call the main number to the clinic Dept: [phone] and follow the prompts.   For any non-urgent questions, you may also contact your provider using MyChart. We now offer e-Visits for anyone 59 and older to request care online for  non-urgent symptoms. For details visit [email or web address].   Also download the MyChart app! Go to the app store, search "MyChart", open the app, select , and log in with your MyChart username and password. Fluorouracil Injection What is this medication? FLUOROURACIL (flure oh YOOR a sil) treats some types of cancer. It works by slowing down the growth of cancer cells. This medicine may be used for other purposes; ask your health care provider or pharmacist if you have questions. COMMON BRAND NAME(S): Adrucil What should I tell my care team before I take this medication? They need to know if you have any of these conditions: Blood disorders Dihydropyrimidine dehydrogenase (DPD) deficiency Infection, such as chickenpox, cold sores, herpes Kidney disease Liver disease Poor nutrition Recent or ongoing radiation therapy An unusual or allergic reaction to fluorouracil, other medications, foods, dyes, or preservatives If you or your partner are pregnant or trying to get pregnant Breast-feeding How should I use this medication? This medication is injected into a vein. It is administered by your care team in a hospital or clinic setting. Talk to your care team about the use of this medication in children. Special care may be needed. Overdosage: If you think you have taken too much of this medicine contact a poison control center or emergency room at once. NOTE: This medicine is only for you. Do not share this medicine with others. What if I miss a dose? Keep appointments for follow-up doses. It is important not to miss your dose. Call your care team if you are unable to keep an appointment. What may interact with this medication? Do not take this medication with any of the following: Live virus vaccines This medication may also interact with the following: Medications that treat or prevent blood clots, such as warfarin, enoxaparin, dalteparin This list may not describe all  possible interactions. Give your health care provider a list of all the medicines, herbs, non-prescription drugs, or dietary supplements you use. Also tell them if you smoke, drink alcohol, or use illegal drugs. Some items may interact with your medicine. What should I watch for while using this medication? Your condition will be monitored carefully while you are receiving this medication. This medication may make you feel generally unwell. This is not uncommon as chemotherapy can affect healthy cells as well as cancer cells. Report any side effects. Continue your course of treatment even though you feel ill unless your care team tells you to stop. In some cases, you may be given additional medications to help with side effects. Follow all directions for their use. This medication may increase your risk of getting an infection. Call your care team for advice if you get a fever, chills, sore throat, or other symptoms of a cold or flu. Do not treat yourself. Try to avoid being around people who are sick. This medication may increase your risk to bruise or bleed. Call your care team if you notice any unusual bleeding. Be careful brushing or flossing your teeth or using  a toothpick because you may get an infection or bleed more easily. If you have any dental work done, tell your dentist you are receiving this medication. Avoid taking medications that contain aspirin, acetaminophen, ibuprofen, naproxen, or ketoprofen unless instructed by your care team. These medications may hide a fever. Do not treat diarrhea with over the counter products. Contact your care team if you have diarrhea that lasts more than 2 days or if it is severe and watery. This medication can make you more sensitive to the sun. Keep out of the sun. If you cannot avoid being in the sun, wear protective clothing and sunscreen. Do not use sun lamps, tanning beds, or tanning booths. Talk to your care team if you or your partner wish to become  pregnant or think you might be pregnant. This medication can cause serious birth defects if taken during pregnancy and for 3 months after the last dose. A reliable form of contraception is recommended while taking this medication and for 3 months after the last dose. Talk to your care team about effective forms of contraception. Do not father a child while taking this medication and for 3 months after the last dose. Use a condom while having sex during this time period. Do not breastfeed while taking this medication. This medication may cause infertility. Talk to your care team if you are concerned about your fertility. What side effects may I notice from receiving this medication? Side effects that you should report to your care team as soon as possible: Allergic reactions--skin rash, itching, hives, swelling of the face, lips, tongue, or throat Heart attack--pain or tightness in the chest, shoulders, arms, or jaw, nausea, shortness of breath, cold or clammy skin, feeling faint or lightheaded Heart failure--shortness of breath, swelling of the ankles, feet, or hands, sudden weight gain, unusual weakness or fatigue Heart rhythm changes--fast or irregular heartbeat, dizziness, feeling faint or lightheaded, chest pain, trouble breathing High ammonia level--unusual weakness or fatigue, confusion, loss of appetite, nausea, vomiting, seizures Infection--fever, chills, cough, sore throat, wounds that don't heal, pain or trouble when passing urine, general feeling of discomfort or being unwell Low red blood cell level--unusual weakness or fatigue, dizziness, headache, trouble breathing Pain, tingling, or numbness in the hands or feet, muscle weakness, change in vision, confusion or trouble speaking, loss of balance or coordination, trouble walking, seizures Redness, swelling, and blistering of the skin over hands and feet Severe or prolonged diarrhea Unusual bruising or bleeding Side effects that usually do  not require medical attention (report to your care team if they continue or are bothersome): Dry skin Headache Increased tears Nausea Pain, redness, or swelling with sores inside the mouth or throat Sensitivity to light Vomiting This list may not describe all possible side effects. Call your doctor for medical advice about side effects. You may report side effects to FDA at 1-800-FDA-1088. Where should I keep my medication? This medication is given in a hospital or clinic. It will not be stored at home. NOTE: This sheet is a summary. It may not cover all possible information. If you have questions about this medicine, talk to your doctor, pharmacist, or health care provider.  2024 Elsevier/Gold Standard (2021-05-29 00:00:00)

## 2022-12-25 NOTE — Progress Notes (Signed)
Patient seen by [name] NP today  Vitals are within treatment parameters:Yes   Labs are within treatment parameters: Yes   Treatment plan has been signed: Yes   Per physician team, Patient is ready for treatment and there are NO modifications to the treatment plan. CEA needs to be draw.

## 2022-12-25 NOTE — Progress Notes (Signed)
[facility] OFFICE PROGRESS NOTE   Diagnosis: Esophagus cancer  INTERVAL HISTORY:   Alison Garner returns as scheduled.  She completed another cycle of 5-fluorouracil 12/11/2022.  She denies nausea/vomiting.  No mouth sores.  No change in baseline bowel habits following treatment.  No hand or foot pain or redness.  She is completing a second course of cephalexin for a UTI.  No dysphagia.  She has a good appetite.  Objective:  Vital signs in last 24 hours:  Blood pressure (!) 141/64, pulse 94, temperature 98.1 F (36.7 C), temperature source Temporal, resp. rate 18, height 5\' 5"  (1.651 m), weight 135 lb (61.2 kg), SpO2 100%.    HEENT: No thrush or ulcers. Resp: Distant breath sounds.  No respiratory distress. Cardio: Regular rate and rhythm. GI: No hepatosplenomegaly. Vascular: No leg edema.  Skin: Palms without erythema. Port-A-Cath without erythema.  Lab Results:  Lab Results  Component Value Date   WBC 4.5 12/25/2022   HGB 13.6 12/25/2022   HCT 38.4 12/25/2022   MCV 93.2 12/25/2022   PLT 172 12/25/2022   NEUTROABS 2.5 12/25/2022    Imaging:  No results found.  Medications: I have reviewed the patient's current medications.  Assessment/Plan: Distal esophagus cancer Esophagram 11/21/2020-moderate to severe esophageal dysmotility, narrowing of the distal esophagus just above the GE junction to 4 mm, 13 mm barium tablet stuck in the distal esophagus Upper endoscopy 12/13/2020-severe esophagitis with nodularity and stricturing at 36 cm-adenocarcinoma, HER2 negative, no loss of mismatch repair protein expression, PD-L1 combined positive score less than 1% CT chest 12/14/2020-accentuated density at the distal esophageal lumen above a small type I hiatal hernia concerning for malignancy, nonpathologically enlarged AP window and right hilar nodes PET 12/21/2020-abnormal FDG uptake at the distal esophagus, no evidence of nodal or distant metastatic  disease Radiation 01/08/2021-02/16/2021 Cycle 1 weekly Taxol/carboplatin 01/08/2021 Cycle 2 weekly Taxol/carboplatin 01/15/2021 Treatment held 01/22/2021 due to neutropenia Positive COVID test 02/02/2021 Taxol/carboplatin 02/15/2021 Endoscopy 04/30/2021-shallow ulceration at the distal esophagus, nodularity at the gastric cardia-biopsies negative for malignancy CTs 04/02/2022-small mediastinal and right hilar lymph nodes.  Esophagus is mildly patulous decreased from previous in the mid to upper aspect of the mediastinum.  More distally the wall thickening of the esophagus appears slightly decreased.  Small hiatal hernia.  The area of thickening does correspond to the abnormality on PET-CT.  Emphysema. Endoscopy 04/15/2022-Short segment salmon-colored mucosa 1 cm above the Z-line.  Scarring at the gastroesophageal junction/Z-line with nonobstructing narrowing.  Nodular mucosa in the gastric cardia immediately distal to the Z-line.  Pathology on GE junction nodule-invasive moderately differentiated adenocarcinoma, tumor arises within a background of high-grade dysplasia, mild chronic active gastritis with foveolar hyperplasia, negative for squamous epithelium and intestinal metaplasia.  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  Negative for HER2 by IHC, PD-L1 CPS 2% PET scan 04/25/2022-increased radiotracer uptake associated with the distal esophagus at the level of the GE junction.  No evidence of nodal metastasis or distant metastatic disease. Cycle 1 FOLFOX 06/04/2022 Cycle 2 FOLFOX 06/18/2022, oxaliplatin dose reduced secondary thrombocytopenia Cycle 3 FOLFOX 06/24/2022, oxaliplatin dose reduced secondary thrombocytopenia, 5-FU dose reduced secondary to hand/foot syndrome 07/16/2022 chemotherapy held due to thrombocytopenia, dehydration Cycle 4 FOLFOX 07/23/2022, oxaliplatin dose reduced due to thrombocytopenia Cycle 5 FOLFOX 08/05/2022 CT chest 08/13/2022-mild focal wall thickening at  the distal esophagus, 8 mm gastropathic node-not FDG avid on PET, unchanged small mediastinal nodes no evidence of metastatic disease in the chest Upper endoscopy 08/14/2022-benign-appearing mild stenosis in the  distal esophagus-microscopic focus of adenocarcinoma Cycle 6 FOLFOX 09/04/2022 Maintenance 5-fluorouracil via pump every 2 weeks beginning 10/30/2022 5-fluorouracil infusion 11/14/2022 5-FU  infusion 11/27/2022 5-FU infusion 12/11/2022 5-FU infusion 12/25/2022 Solid dysphagia secondary #1, resolved COPD on chest CT 12/14/2020 Hemorrhoids Hyperlipidemia Irritable bowel syndrome Hypothyroidism Tobacco use Thrombocytopenia secondary to chemotherapy Hand/foot syndrome-5-FU dose reduced with cycle 3 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 Raudabaugh appears stable.  She continues infusional 5-FU every 2 weeks.  She is tolerating well.  Plan to continue the same, treatment today.  CBC and chemistry panel reviewed.  Labs adequate to proceed as above.  She will return for follow-up and treatment in 2 weeks.  We are available to see her sooner if needed.    Alison Garner ANP/GNP-BC   12/25/2022  9:18 AM

## 2022-12-26 ENCOUNTER — Telehealth (HOSPITAL_BASED_OUTPATIENT_CLINIC_OR_DEPARTMENT_OTHER): Payer: Self-pay | Admitting: Nurse Practitioner

## 2022-12-26 ENCOUNTER — Encounter: Payer: Self-pay | Admitting: Nurse Practitioner

## 2022-12-27 ENCOUNTER — Inpatient Hospital Stay: Payer: Medicare Other

## 2022-12-27 VITALS — BP 124/64 | HR 89 | Temp 97.2°F | Resp 18

## 2022-12-27 DIAGNOSIS — Z5111 Encounter for antineoplastic chemotherapy: Secondary | ICD-10-CM | POA: Diagnosis not present

## 2022-12-27 DIAGNOSIS — C155 Malignant neoplasm of lower third of esophagus: Secondary | ICD-10-CM

## 2022-12-27 MED ORDER — SODIUM CHLORIDE 0.9% FLUSH
10.0000 mL | INTRAVENOUS | Status: DC | PRN
  Administered 2022-12-27: 10 mL

## 2022-12-27 MED ORDER — HEPARIN SOD (PORK) LOCK FLUSH 100 UNIT/ML IV SOLN
500.0000 [IU] | Freq: Once | INTRAVENOUS | Status: AC | PRN
  Administered 2022-12-27: 500 [IU]

## 2022-12-27 NOTE — Patient Instructions (Signed)

## 2023-01-03 ENCOUNTER — Other Ambulatory Visit: Payer: Self-pay | Admitting: Oncology

## 2023-01-08 ENCOUNTER — Encounter: Payer: Self-pay | Admitting: Nurse Practitioner

## 2023-01-08 ENCOUNTER — Inpatient Hospital Stay: Payer: Medicare Other

## 2023-01-08 ENCOUNTER — Inpatient Hospital Stay: Payer: Medicare Other | Attending: Oncology

## 2023-01-08 ENCOUNTER — Inpatient Hospital Stay (HOSPITAL_BASED_OUTPATIENT_CLINIC_OR_DEPARTMENT_OTHER): Payer: Medicare Other | Admitting: Nurse Practitioner

## 2023-01-08 VITALS — BP 136/68 | HR 79 | Temp 98.2°F | Resp 18 | Ht 65.0 in | Wt 135.5 lb

## 2023-01-08 DIAGNOSIS — G620 Drug-induced polyneuropathy: Secondary | ICD-10-CM | POA: Diagnosis not present

## 2023-01-08 DIAGNOSIS — C155 Malignant neoplasm of lower third of esophagus: Secondary | ICD-10-CM

## 2023-01-08 DIAGNOSIS — K589 Irritable bowel syndrome without diarrhea: Secondary | ICD-10-CM | POA: Diagnosis not present

## 2023-01-08 DIAGNOSIS — K649 Unspecified hemorrhoids: Secondary | ICD-10-CM | POA: Insufficient documentation

## 2023-01-08 DIAGNOSIS — E785 Hyperlipidemia, unspecified: Secondary | ICD-10-CM | POA: Diagnosis not present

## 2023-01-08 DIAGNOSIS — Z5111 Encounter for antineoplastic chemotherapy: Secondary | ICD-10-CM | POA: Insufficient documentation

## 2023-01-08 DIAGNOSIS — D6959 Other secondary thrombocytopenia: Secondary | ICD-10-CM | POA: Insufficient documentation

## 2023-01-08 DIAGNOSIS — E039 Hypothyroidism, unspecified: Secondary | ICD-10-CM | POA: Diagnosis not present

## 2023-01-08 LAB — CBC WITH DIFFERENTIAL (CANCER CENTER ONLY)
Abs Immature Granulocytes: 0.01 10*3/uL (ref 0.00–0.07)
Basophils Absolute: 0 10*3/uL (ref 0.0–0.1)
Basophils Relative: 0 %
Eosinophils Absolute: 0.1 10*3/uL (ref 0.0–0.5)
Eosinophils Relative: 2 %
HCT: 39.8 % (ref 36.0–46.0)
Hemoglobin: 13.8 g/dL (ref 12.0–15.0)
Immature Granulocytes: 0 %
Lymphocytes Relative: 30 %
Lymphs Abs: 1.3 10*3/uL (ref 0.7–4.0)
MCH: 32.9 pg (ref 26.0–34.0)
MCHC: 34.7 g/dL (ref 30.0–36.0)
MCV: 94.8 fL (ref 80.0–100.0)
Monocytes Absolute: 0.4 10*3/uL (ref 0.1–1.0)
Monocytes Relative: 9 %
Neutro Abs: 2.6 10*3/uL (ref 1.7–7.7)
Neutrophils Relative %: 59 %
Platelet Count: 154 10*3/uL (ref 150–400)
RBC: 4.2 MIL/uL (ref 3.87–5.11)
RDW: 17.5 % — ABNORMAL HIGH (ref 11.5–15.5)
WBC Count: 4.4 10*3/uL (ref 4.0–10.5)
nRBC: 0 % (ref 0.0–0.2)

## 2023-01-08 LAB — CMP (CANCER CENTER ONLY)
ALT: 11 U/L (ref 0–44)
AST: 15 U/L (ref 15–41)
Albumin: 4 g/dL (ref 3.5–5.0)
Alkaline Phosphatase: 76 U/L (ref 38–126)
Anion gap: 8 (ref 5–15)
BUN: 12 mg/dL (ref 8–23)
CO2: 28 mmol/L (ref 22–32)
Calcium: 9.4 mg/dL (ref 8.9–10.3)
Chloride: 100 mmol/L (ref 98–111)
Creatinine: 1.1 mg/dL — ABNORMAL HIGH (ref 0.44–1.00)
GFR, Estimated: 53 mL/min — ABNORMAL LOW (ref >60)
Glucose, Bld: 114 mg/dL — ABNORMAL HIGH (ref 70–99)
Potassium: 4 mmol/L (ref 3.5–5.1)
Sodium: 136 mmol/L (ref 135–145)
Total Bilirubin: 0.7 mg/dL (ref <1.2)
Total Protein: 6.7 g/dL (ref 6.5–8.1)

## 2023-01-08 MED ORDER — SODIUM CHLORIDE 0.9% FLUSH
10.0000 mL | INTRAVENOUS | Status: DC | PRN
  Administered 2023-01-08: 10 mL

## 2023-01-08 MED ORDER — SODIUM CHLORIDE 0.9 % IV SOLN
1800.0000 mg/m2 | INTRAVENOUS | Status: DC
  Administered 2023-01-08: 3000 mg via INTRAVENOUS
  Filled 2023-01-08: qty 60

## 2023-01-08 NOTE — Patient Instructions (Addendum)
[facility]  Discharge Instructions: The chemotherapy medication bag should finish at 46 hours, 96 hours, or 7 days. For example, if your pump is scheduled for 46 hours and it was put on at 4:00 p.m., it should finish at 2:00 p.m. the day it is scheduled to come off regardless of your appointment time.     Estimated time to finish at 01/10/23, 0830   If the display on your pump reads "Low Volume" and it is beeping, take the batteries out of the pump and come to the cancer center for it to be taken off.   If the pump alarms go off prior to the pump reading "Low Volume" then call [phone] and someone can assist you.  If the plunger comes out and the chemotherapy medication is leaking out, please use your home chemo spill kit to clean up the spill. Do NOT use paper towels or other household products.  If you have problems or questions regarding your pump, please call either [phone] (24 hours a day) or the cancer center Monday-Friday 8:00 a.m.- 4:30 p.m. at the clinic number and we will assist you. If you are unable to get assistance, then go to the nearest Emergency Department and ask the staff to contact the IV team for assistance.   Thank you for choosing [facility] to provide your oncology and hematology care.   If you have a lab appointment with the Cancer Center, please go directly to the Cancer Center and check in at the registration area.   Wear comfortable clothing and clothing appropriate for easy access to any Portacath or PICC line.   We strive to give you quality time with your provider. You may need to reschedule your appointment if you arrive late (15 or more minutes).  Arriving late affects you and other patients whose appointments are after yours.  Also, if you miss three or more appointments without notifying the office, you may be dismissed from the clinic at the provider's discretion.      For  prescription refill requests, have your pharmacy contact our office and allow 72 hours for refills to be completed.    Today you received the following chemotherapy and/or immunotherapy agents fluorouracil    To help prevent nausea and vomiting after your treatment, we encourage you to take your nausea medication as directed.  BELOW ARE SYMPTOMS THAT SHOULD BE REPORTED IMMEDIATELY: *FEVER GREATER THAN 100.4 F (38 C) OR HIGHER *CHILLS OR SWEATING *NAUSEA AND VOMITING THAT IS NOT CONTROLLED WITH YOUR NAUSEA MEDICATION *UNUSUAL SHORTNESS OF BREATH *UNUSUAL BRUISING OR BLEEDING *URINARY PROBLEMS (pain or burning when urinating, or frequent urination) *BOWEL PROBLEMS (unusual diarrhea, constipation, pain near the anus) TENDERNESS IN MOUTH AND THROAT WITH OR WITHOUT PRESENCE OF ULCERS (sore throat, sores in mouth, or a toothache) UNUSUAL RASH, SWELLING OR PAIN  UNUSUAL VAGINAL DISCHARGE OR ITCHING   Items with * indicate a potential emergency and should be followed up as soon as possible or go to the Emergency Department if any problems should occur.  Please show the CHEMOTHERAPY ALERT CARD or IMMUNOTHERAPY ALERT CARD at check-in to the Emergency Department and triage nurse.  Should you have questions after your visit or need to cancel or reschedule your appointment, please contact [facility]  Dept: [phone]  and follow the prompts.  Office hours are 8:00 a.m. to 4:30 p.m.  Monday - Friday. Please note that voicemails left after 4:00 p.m. may not be returned until the following business day.  We are closed weekends and major holidays. You have access to a nurse at all times for urgent questions. Please call the main number to the clinic Dept: [phone] and follow the prompts.   For any non-urgent questions, you may also contact your provider using MyChart. We now offer e-Visits for anyone 47 and older to request care online for  non-urgent symptoms. For details visit [email or web address].   Also download the MyChart app! Go to the app store, search "MyChart", open the app, select [place], and log in with your MyChart username and password.  Fluorouracil Injection What is this medication? FLUOROURACIL (flure oh YOOR a sil) treats some types of cancer. It works by slowing down the growth of cancer cells. This medicine may be used for other purposes; ask your health care provider or pharmacist if you have questions. COMMON BRAND NAME(S): Adrucil What should I tell my care team before I take this medication? They need to know if you have any of these conditions: Blood disorders Dihydropyrimidine dehydrogenase (DPD) deficiency Infection, such as chickenpox, cold sores, herpes Kidney disease Liver disease Poor nutrition Recent or ongoing radiation therapy An unusual or allergic reaction to fluorouracil, other medications, foods, dyes, or preservatives If you or your partner are pregnant or trying to get pregnant Breast-feeding How should I use this medication? This medication is injected into a vein. It is administered by your care team in a hospital or clinic setting. Talk to your care team about the use of this medication in children. Special care may be needed. Overdosage: If you think you have taken too much of this medicine contact a poison control center or emergency room at once. NOTE: This medicine is only for you. Do not share this medicine with others. What if I miss a dose? Keep appointments for follow-up doses. It is important not to miss your dose. Call your care team if you are unable to keep an appointment. What may interact with this medication? Do not take this medication with any of the following: Live virus vaccines This medication may also interact with the following: Medications that treat or prevent blood clots, such as warfarin, enoxaparin, dalteparin This list may not describe all  possible interactions. Give your health care provider a list of all the medicines, herbs, non-prescription drugs, or dietary supplements you use. Also tell them if you smoke, drink alcohol, or use illegal drugs. Some items may interact with your medicine. What should I watch for while using this medication? Your condition will be monitored carefully while you are receiving this medication. This medication may make you feel generally unwell. This is not uncommon as chemotherapy can affect healthy cells as well as cancer cells. Report any side effects. Continue your course of treatment even though you feel ill unless your care team tells you to stop. In some cases, you may be given additional medications to help with side effects. Follow all directions for their use. This medication may increase your risk of getting an infection. Call your care team for advice if you get a fever, chills, sore throat, or other symptoms of a cold or flu. Do not treat yourself. Try to avoid being around people who are sick. This medication may increase your risk to bruise or bleed. Call your care team if you notice any unusual bleeding. Be careful brushing or flossing your teeth or using a  toothpick because you may get an infection or bleed more easily. If you have any dental work done, tell your dentist you are receiving this medication. Avoid taking medications that contain aspirin, acetaminophen, ibuprofen, naproxen, or ketoprofen unless instructed by your care team. These medications may hide a fever. Do not treat diarrhea with over the counter products. Contact your care team if you have diarrhea that lasts more than 2 days or if it is severe and watery. This medication can make you more sensitive to the sun. Keep out of the sun. If you cannot avoid being in the sun, wear protective clothing and sunscreen. Do not use sun lamps, tanning beds, or tanning booths. Talk to your care team if you or your partner wish to become  pregnant or think you might be pregnant. This medication can cause serious birth defects if taken during pregnancy and for 3 months after the last dose. A reliable form of contraception is recommended while taking this medication and for 3 months after the last dose. Talk to your care team about effective forms of contraception. Do not father a child while taking this medication and for 3 months after the last dose. Use a condom while having sex during this time period. Do not breastfeed while taking this medication. This medication may cause infertility. Talk to your care team if you are concerned about your fertility. What side effects may I notice from receiving this medication? Side effects that you should report to your care team as soon as possible: Allergic reactions--skin rash, itching, hives, swelling of the face, lips, tongue, or throat Heart attack--pain or tightness in the chest, shoulders, arms, or jaw, nausea, shortness of breath, cold or clammy skin, feeling faint or lightheaded Heart failure--shortness of breath, swelling of the ankles, feet, or hands, sudden weight gain, unusual weakness or fatigue Heart rhythm changes--fast or irregular heartbeat, dizziness, feeling faint or lightheaded, chest pain, trouble breathing High ammonia level--unusual weakness or fatigue, confusion, loss of appetite, nausea, vomiting, seizures Infection--fever, chills, cough, sore throat, wounds that don't heal, pain or trouble when passing urine, general feeling of discomfort or being unwell Low red blood cell level--unusual weakness or fatigue, dizziness, headache, trouble breathing Pain, tingling, or numbness in the hands or feet, muscle weakness, change in vision, confusion or trouble speaking, loss of balance or coordination, trouble walking, seizures Redness, swelling, and blistering of the skin over hands and feet Severe or prolonged diarrhea Unusual bruising or bleeding Side effects that usually do  not require medical attention (report to your care team if they continue or are bothersome): Dry skin Headache Increased tears Nausea Pain, redness, or swelling with sores inside the mouth or throat Sensitivity to light Vomiting This list may not describe all possible side effects. Call your doctor for medical advice about side effects. You may report side effects to FDA at 1-800-FDA-1088. Where should I keep my medication? This medication is given in a hospital or clinic. It will not be stored at home. NOTE: This sheet is a summary. It may not cover all possible information. If you have questions about this medicine, talk to your doctor, pharmacist, or health care provider.  2024 Elsevier/Gold Standard (2021-05-29 00:00:00)

## 2023-01-08 NOTE — Progress Notes (Signed)
[facility] OFFICE PROGRESS NOTE   Diagnosis: Esophagus cancer  INTERVAL HISTORY:   Alison Garner returns as scheduled.  She completed another cycle of 5-fluorouracil 12/25/2022.  She denies nausea/vomiting.  No mouth sores.  No diarrhea.  No hand or foot pain or redness.  Stable neuropathy symptoms.  No dysphagia.  Objective:  Vital signs in last 24 hours:  Blood pressure 136/68, pulse 79, temperature 98.2 F (36.8 C), temperature source Temporal, resp. rate 18, height 5\' 5"  (1.651 m), weight 135 lb 8 oz (61.5 kg), SpO2 98%.    HEENT: No thrush or ulcers. Resp: Distant breath sounds.  No respiratory distress. Cardio: Regular rate and rhythm. GI: No hepatosplenomegaly. Vascular: No leg edema. Skin: Palms without erythema. Port-A-Cath without erythema.  Lab Results:  Lab Results  Component Value Date   WBC 4.4 01/08/2023   HGB 13.8 01/08/2023   HCT 39.8 01/08/2023   MCV 94.8 01/08/2023   PLT 154 01/08/2023   NEUTROABS 2.6 01/08/2023    Imaging:  No results found.  Medications: I have reviewed the patient's current medications.  Assessment/Plan: Distal esophagus cancer Esophagram 11/21/2020-moderate to severe esophageal dysmotility, narrowing of the distal esophagus just above the GE junction to 4 mm, 13 mm barium tablet stuck in the distal esophagus Upper endoscopy 12/13/2020-severe esophagitis with nodularity and stricturing at 36 cm-adenocarcinoma, HER2 negative, no loss of mismatch repair protein expression, PD-L1 combined positive score less than 1% CT chest 12/14/2020-accentuated density at the distal esophageal lumen above a small type I hiatal hernia concerning for malignancy, nonpathologically enlarged AP window and right hilar nodes PET 12/21/2020-abnormal FDG uptake at the distal esophagus, no evidence of nodal or distant metastatic disease Radiation 01/08/2021-02/16/2021 Cycle 1 weekly Taxol/carboplatin 01/08/2021 Cycle 2 weekly Taxol/carboplatin  01/15/2021 Treatment held 01/22/2021 due to neutropenia Positive COVID test 02/02/2021 Taxol/carboplatin 02/15/2021 Endoscopy 04/30/2021-shallow ulceration at the distal esophagus, nodularity at the gastric cardia-biopsies negative for malignancy CTs 04/02/2022-small mediastinal and right hilar lymph nodes.  Esophagus is mildly patulous decreased from previous in the mid to upper aspect of the mediastinum.  More distally the wall thickening of the esophagus appears slightly decreased.  Small hiatal hernia.  The area of thickening does correspond to the abnormality on PET-CT.  Emphysema. Endoscopy 04/15/2022-Short segment salmon-colored mucosa 1 cm above the Z-line.  Scarring at the gastroesophageal junction/Z-line with nonobstructing narrowing.  Nodular mucosa in the gastric cardia immediately distal to the Z-line.  Pathology on GE junction nodule-invasive moderately differentiated adenocarcinoma, tumor arises within a background of high-grade dysplasia, mild chronic active gastritis with foveolar hyperplasia, negative for squamous epithelium and intestinal metaplasia.  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  Negative for HER2 by IHC, PD-L1 CPS 2% PET scan 04/25/2022-increased radiotracer uptake associated with the distal esophagus at the level of the GE junction.  No evidence of nodal metastasis or distant metastatic disease. Cycle 1 FOLFOX 06/04/2022 Cycle 2 FOLFOX 06/18/2022, oxaliplatin dose reduced secondary thrombocytopenia Cycle 3 FOLFOX 06/24/2022, oxaliplatin dose reduced secondary thrombocytopenia, 5-FU dose reduced secondary to hand/foot syndrome 07/16/2022 chemotherapy held due to thrombocytopenia, dehydration Cycle 4 FOLFOX 07/23/2022, oxaliplatin dose reduced due to thrombocytopenia Cycle 5 FOLFOX 08/05/2022 CT chest 08/13/2022-mild focal wall thickening at the distal esophagus, 8 mm gastropathic node-not FDG avid on PET, unchanged small mediastinal nodes no evidence of  metastatic disease in the chest Upper endoscopy 08/14/2022-benign-appearing mild stenosis in the distal esophagus-microscopic focus of adenocarcinoma Cycle 6 FOLFOX 09/04/2022 Maintenance 5-fluorouracil via pump every 2 weeks beginning 10/30/2022 5-fluorouracil infusion  11/14/2022 5-FU  infusion 11/27/2022 5-FU infusion 12/11/2022 5-FU infusion 12/25/2022 5-FU infusion 01/08/2023 Solid dysphagia secondary #1, resolved COPD on chest CT 12/14/2020 Hemorrhoids Hyperlipidemia Irritable bowel syndrome Hypothyroidism Tobacco use Thrombocytopenia secondary to chemotherapy Hand/foot syndrome-5-FU dose reduced with cycle 3 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  She continues infusional 5-FU every 2 weeks.  She is tolerating well.  Plan to proceed with treatment today as scheduled.  Restaging chest CT prior to next office visit.  CBC and chemistry panel reviewed.  Labs adequate to proceed as above.  She will return for follow-up in 2 weeks.  She requests the infusion appointment be canceled in 2 weeks.    [name] ANP/GNP-BC   01/08/2023  8:53 AM

## 2023-01-08 NOTE — Progress Notes (Signed)
Patient seen by [name] NP today  Vitals are within treatment parameters:Yes   Labs are within treatment parameters: Yes   Treatment plan has been signed: Yes   Per physician team, Patient is ready for treatment and there are NO modifications to the treatment plan.

## 2023-01-10 ENCOUNTER — Inpatient Hospital Stay: Payer: Medicare Other

## 2023-01-10 VITALS — BP 156/78 | HR 88 | Temp 98.1°F | Resp 18

## 2023-01-10 DIAGNOSIS — C155 Malignant neoplasm of lower third of esophagus: Secondary | ICD-10-CM

## 2023-01-10 DIAGNOSIS — Z5111 Encounter for antineoplastic chemotherapy: Secondary | ICD-10-CM | POA: Diagnosis not present

## 2023-01-10 MED ORDER — HEPARIN SOD (PORK) LOCK FLUSH 100 UNIT/ML IV SOLN
500.0000 [IU] | Freq: Once | INTRAVENOUS | Status: AC | PRN
  Administered 2023-01-10: 500 [IU]

## 2023-01-10 MED ORDER — SODIUM CHLORIDE 0.9% FLUSH
10.0000 mL | INTRAVENOUS | Status: DC | PRN
Start: 2023-01-10 — End: 2023-01-10
  Administered 2023-01-10: 10 mL

## 2023-01-10 NOTE — Patient Instructions (Signed)

## 2023-01-16 ENCOUNTER — Ambulatory Visit (HOSPITAL_BASED_OUTPATIENT_CLINIC_OR_DEPARTMENT_OTHER)
Admission: RE | Admit: 2023-01-16 | Discharge: 2023-01-16 | Disposition: A | Discharge status: Home / Self Care | Payer: Medicare Other | Source: Ambulatory Visit | Attending: Nurse Practitioner | Admitting: Nurse Practitioner

## 2023-01-16 DIAGNOSIS — C155 Malignant neoplasm of lower third of esophagus: Secondary | ICD-10-CM | POA: Insufficient documentation

## 2023-01-16 MED ORDER — IOHEXOL 300 MG/ML  SOLN
100.0000 mL | Freq: Once | INTRAMUSCULAR | Status: AC | PRN
  Administered 2023-01-16: 75 mL via INTRAVENOUS

## 2023-01-19 ENCOUNTER — Other Ambulatory Visit: Payer: Self-pay | Admitting: Oncology

## 2023-01-19 DIAGNOSIS — C155 Malignant neoplasm of lower third of esophagus: Secondary | ICD-10-CM

## 2023-01-20 ENCOUNTER — Encounter: Payer: Self-pay | Admitting: Oncology

## 2023-01-22 ENCOUNTER — Inpatient Hospital Stay: Payer: Medicare Other

## 2023-01-22 ENCOUNTER — Inpatient Hospital Stay (HOSPITAL_BASED_OUTPATIENT_CLINIC_OR_DEPARTMENT_OTHER): Payer: Medicare Other | Admitting: Oncology

## 2023-01-22 ENCOUNTER — Other Ambulatory Visit: Payer: Medicare Other

## 2023-01-22 ENCOUNTER — Ambulatory Visit: Payer: Medicare Other

## 2023-01-22 VITALS — BP 107/59 | HR 97 | Temp 98.1°F | Resp 18 | Ht 65.0 in | Wt 134.9 lb

## 2023-01-22 DIAGNOSIS — C155 Malignant neoplasm of lower third of esophagus: Secondary | ICD-10-CM

## 2023-01-22 DIAGNOSIS — Z5111 Encounter for antineoplastic chemotherapy: Secondary | ICD-10-CM | POA: Diagnosis not present

## 2023-01-22 LAB — CMP (CANCER CENTER ONLY)
ALT: 12 U/L (ref 0–44)
AST: 17 U/L (ref 15–41)
Albumin: 4.1 g/dL (ref 3.5–5.0)
Alkaline Phosphatase: 81 U/L (ref 38–126)
Anion gap: 6 (ref 5–15)
BUN: 10 mg/dL (ref 8–23)
CO2: 28 mmol/L (ref 22–32)
Calcium: 9.4 mg/dL (ref 8.9–10.3)
Chloride: 100 mmol/L (ref 98–111)
Creatinine: 0.93 mg/dL (ref 0.44–1.00)
GFR, Estimated: >60 mL/min (ref >60)
Glucose, Bld: 106 mg/dL — ABNORMAL HIGH (ref 70–99)
Potassium: 3.9 mmol/L (ref 3.5–5.1)
Sodium: 134 mmol/L — ABNORMAL LOW (ref 135–145)
Total Bilirubin: 0.5 mg/dL (ref <1.2)
Total Protein: 6.7 g/dL (ref 6.5–8.1)

## 2023-01-22 LAB — CBC WITH DIFFERENTIAL (CANCER CENTER ONLY)
Abs Immature Granulocytes: 0.02 10*3/uL (ref 0.00–0.07)
Basophils Absolute: 0 10*3/uL (ref 0.0–0.1)
Basophils Relative: 1 %
Eosinophils Absolute: 0.1 10*3/uL (ref 0.0–0.5)
Eosinophils Relative: 1 %
HCT: 39 % (ref 36.0–46.0)
Hemoglobin: 13.7 g/dL (ref 12.0–15.0)
Immature Granulocytes: 0 %
Lymphocytes Relative: 25 %
Lymphs Abs: 1.6 10*3/uL (ref 0.7–4.0)
MCH: 33.7 pg (ref 26.0–34.0)
MCHC: 35.1 g/dL (ref 30.0–36.0)
MCV: 95.8 fL (ref 80.0–100.0)
Monocytes Absolute: 0.5 10*3/uL (ref 0.1–1.0)
Monocytes Relative: 8 %
Neutro Abs: 4.1 10*3/uL (ref 1.7–7.7)
Neutrophils Relative %: 65 %
Platelet Count: 160 10*3/uL (ref 150–400)
RBC: 4.07 MIL/uL (ref 3.87–5.11)
RDW: 17.2 % — ABNORMAL HIGH (ref 11.5–15.5)
WBC Count: 6.3 10*3/uL (ref 4.0–10.5)
nRBC: 0 % (ref 0.0–0.2)

## 2023-01-22 LAB — CEA (ACCESS): CEA (CHCC): 12.87 ng/mL — ABNORMAL HIGH (ref 0.00–5.00)

## 2023-01-22 NOTE — Progress Notes (Signed)
[facility] OFFICE PROGRESS NOTE   Diagnosis: Esophagus cancer  INTERVAL HISTORY:   Alison Garner completed another cycle of 5-fluorouracil 01/08/2023.  No nausea/vomiting, mouth sores, or diarrhea.  No dysphagia.  She reports recent dysuria.  She was prescribed ciprofloxacin by her primary provider this week.  She has not started the Cipro.  She has difficulty swallowing large pills.  Objective:  Vital signs in last 24 hours:  Blood pressure (!) 107/59, pulse 97, temperature 98.1 F (36.7 C), temperature source Temporal, resp. rate 18, height 5\' 5"  (1.651 m), weight 134 lb 14.4 oz (61.2 kg), SpO2 100%.    Lymphatics: No cervical, supraclavicular, or axillary nodes Resp: Distant breath sounds, end inspiratory rhonchi at the right posterior base, no respiratory distress Cardio: Regular rate and rhythm GI: No hepatosplenomegaly Vascular: No leg edema  Portacath/PICC-without erythema  Lab Results:  Lab Results  Component Value Date   WBC 6.3 01/22/2023   HGB 13.7 01/22/2023   HCT 39.0 01/22/2023   MCV 95.8 01/22/2023   PLT 160 01/22/2023   NEUTROABS 4.1 01/22/2023    CMP  Lab Results  Component Value Date   NA 134 (L) 01/22/2023   K 3.9 01/22/2023   CL 100 01/22/2023   CO2 28 01/22/2023   GLUCOSE 106 (H) 01/22/2023   BUN 10 01/22/2023   CREATININE 0.93 01/22/2023   CALCIUM 9.4 01/22/2023   PROT 6.7 01/22/2023   ALBUMIN 4.1 01/22/2023   AST 17 01/22/2023   ALT 12 01/22/2023   ALKPHOS 81 01/22/2023   BILITOT 0.5 01/22/2023   GFRNONAA >60 01/22/2023    Lab Results  Component Value Date   CEA 12.87 (H) 01/22/2023    No results found for: "INR", "LABPROT"  Imaging:  No results found.  Medications: I have reviewed the patient's current medications.   Assessment/Plan: Distal esophagus cancer Esophagram 11/21/2020-moderate to severe esophageal dysmotility, narrowing of the distal esophagus just above the GE junction to 4 mm, 13 mm barium  tablet stuck in the distal esophagus Upper endoscopy 12/13/2020-severe esophagitis with nodularity and stricturing at 36 cm-adenocarcinoma, HER2 negative, no loss of mismatch repair protein expression, PD-L1 combined positive score less than 1% CT chest 12/14/2020-accentuated density at the distal esophageal lumen above a small type I hiatal hernia concerning for malignancy, nonpathologically enlarged AP window and right hilar nodes PET 12/21/2020-abnormal FDG uptake at the distal esophagus, no evidence of nodal or distant metastatic disease Radiation 01/08/2021-02/16/2021 Cycle 1 weekly Taxol/carboplatin 01/08/2021 Cycle 2 weekly Taxol/carboplatin 01/15/2021 Treatment held 01/22/2021 due to neutropenia Positive COVID test 02/02/2021 Taxol/carboplatin 02/15/2021 Endoscopy 04/30/2021-shallow ulceration at the distal esophagus, nodularity at the gastric cardia-biopsies negative for malignancy CTs 04/02/2022-small mediastinal and right hilar lymph nodes.  Esophagus is mildly patulous decreased from previous in the mid to upper aspect of the mediastinum.  More distally the wall thickening of the esophagus appears slightly decreased.  Small hiatal hernia.  The area of thickening does correspond to the abnormality on PET-CT.  Emphysema. Endoscopy 04/15/2022-Short segment salmon-colored mucosa 1 cm above the Z-line.  Scarring at the gastroesophageal junction/Z-line with nonobstructing narrowing.  Nodular mucosa in the gastric cardia immediately distal to the Z-line.  Pathology on GE junction nodule-invasive moderately differentiated adenocarcinoma, tumor arises within a background of high-grade dysplasia, mild chronic active gastritis with foveolar hyperplasia, negative for squamous epithelium and intestinal metaplasia.  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  Negative for HER2 by IHC, PD-L1 CPS 2% PET scan 04/25/2022-increased radiotracer uptake associated with the distal  esophagus at the  level of the GE junction.  No evidence of nodal metastasis or distant metastatic disease. Cycle 1 FOLFOX 06/04/2022 Cycle 2 FOLFOX 06/18/2022, oxaliplatin dose reduced secondary thrombocytopenia Cycle 3 FOLFOX 06/24/2022, oxaliplatin dose reduced secondary thrombocytopenia, 5-FU dose reduced secondary to hand/foot syndrome 07/16/2022 chemotherapy held due to thrombocytopenia, dehydration Cycle 4 FOLFOX 07/23/2022, oxaliplatin dose reduced due to thrombocytopenia Cycle 5 FOLFOX 08/05/2022 CT chest 08/13/2022-mild focal wall thickening at the distal esophagus, 8 mm gastropathic node-not FDG avid on PET, unchanged small mediastinal nodes no evidence of metastatic disease in the chest Upper endoscopy 08/14/2022-benign-appearing mild stenosis in the distal esophagus-microscopic focus of adenocarcinoma Cycle 6 FOLFOX 09/04/2022 Maintenance 5-fluorouracil via pump every 2 weeks beginning 10/30/2022 5-fluorouracil infusion 11/14/2022 5-FU  infusion 11/27/2022 5-FU infusion 12/11/2022 5-FU infusion 12/25/2022 5-FU infusion 01/08/2023 CT chest 01/16/2023-stable distal esophageal thickening, no evidence of metastatic disease Solid dysphagia secondary #1, resolved COPD on chest CT 12/14/2020 Hemorrhoids Hyperlipidemia Irritable bowel syndrome Hypothyroidism Tobacco use Thrombocytopenia secondary to chemotherapy Hand/foot syndrome-5-FU dose reduced with cycle 3 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 Wollan appears stable.  I reviewed the restaging chest CT findings and images with her.  There is no CT evidence of disease progression.  She is scheduled for a restaging endoscopy 02/11/2022.  She would like to hold further treatment until after the endoscopy.  She will return for an office visit 02/18/2022.  She will contact Dr. Wynona Neat to discuss management of the recent pyuria/hematuria noted on urinalysis  01/21/2023.  Thornton Papas, MD  01/22/2023  11:43 AM

## 2023-01-23 ENCOUNTER — Other Ambulatory Visit: Payer: Self-pay

## 2023-02-10 ENCOUNTER — Encounter: Payer: Self-pay | Admitting: Certified Registered Nurse Anesthetist

## 2023-02-12 ENCOUNTER — Ambulatory Visit (AMBULATORY_SURGERY_CENTER): Payer: Medicare Other | Admitting: Internal Medicine

## 2023-02-12 ENCOUNTER — Encounter: Payer: Self-pay | Admitting: Internal Medicine

## 2023-02-12 VITALS — BP 171/75 | HR 77 | Temp 97.3°F | Resp 15 | Ht 65.0 in | Wt 134.0 lb

## 2023-02-12 DIAGNOSIS — Z8501 Personal history of malignant neoplasm of esophagus: Secondary | ICD-10-CM

## 2023-02-12 DIAGNOSIS — K449 Diaphragmatic hernia without obstruction or gangrene: Secondary | ICD-10-CM

## 2023-02-12 DIAGNOSIS — F1090 Alcohol use, unspecified, uncomplicated: Secondary | ICD-10-CM

## 2023-02-12 DIAGNOSIS — K2289 Other specified disease of esophagus: Secondary | ICD-10-CM | POA: Diagnosis not present

## 2023-02-12 DIAGNOSIS — C160 Malignant neoplasm of cardia: Secondary | ICD-10-CM | POA: Diagnosis present

## 2023-02-12 DIAGNOSIS — C155 Malignant neoplasm of lower third of esophagus: Secondary | ICD-10-CM

## 2023-02-12 DIAGNOSIS — K2950 Unspecified chronic gastritis without bleeding: Secondary | ICD-10-CM | POA: Diagnosis not present

## 2023-02-12 MED ORDER — SODIUM CHLORIDE 0.9 % IV SOLN: 500.0000 mL | Freq: Once | INTRAVENOUS | Status: DC

## 2023-02-12 NOTE — Patient Instructions (Addendum)
 Continue present medications. Await pathology results. Repeat upper endoscopy per oncology recommendation.                            YOU HAD AN ENDOSCOPIC PROCEDURE TODAY AT THE [facility]:   Refer to the procedure report that was given to you for any specific questions about what was found during the examination.  If the procedure report does not answer your questions, please call your gastroenterologist to clarify.  If you requested that your care partner not be given the details of your procedure findings, then the procedure report has been included in a sealed envelope for you to review at your convenience later.  YOU SHOULD EXPECT: Some feelings of bloating in the abdomen. Passage of more gas than usual.  Walking can help get rid of the air that was put into your GI tract during the procedure and reduce the bloating. Please Note:  You might notice some irritation and congestion in your nose or some drainage.  This is from the oxygen used during your procedure.  There is no need for concern and it should clear up in a day or so.  SYMPTOMS TO REPORT IMMEDIATELY:  Following upper endoscopy (EGD)  Vomiting of blood or coffee ground material  New chest pain or pain under the shoulder blades  Painful or persistently difficult swallowing  New shortness of breath  Fever of 100F or higher  Black, tarry-looking stools  For urgent or emergent issues, a gastroenterologist can be reached at any hour by calling [phone]. Do not use MyChart messaging for urgent concerns.    DIET:  We do recommend a small meal at first, but then you may proceed to your regular diet.  Drink plenty of fluids but you should avoid alcoholic beverages for 24 hours.  ACTIVITY:  You should plan to take it easy for the rest of today and you should NOT DRIVE or use heavy machinery until tomorrow (because of the sedation medicines used during the test).    FOLLOW UP: Our staff will call the number  listed on your records the next business day following your procedure.  We will call around 7:15- 8:00 am to check on you and address any questions or concerns that you may have regarding the information given to you following your procedure. If we do not reach you, we will leave a message.     If any biopsies were taken you will be contacted by phone or by letter within the next 1-3 weeks.  Please call us  at [phone] if you have not heard about the biopsies in 3 weeks.    SIGNATURES/CONFIDENTIALITY: You and/or your care partner have signed paperwork which will be entered into your electronic medical record.  These signatures attest to the fact that that the information above on your After Visit Summary has been reviewed and is understood.  Full responsibility of the confidentiality of this discharge information lies with you and/or your care-partner.

## 2023-02-12 NOTE — Progress Notes (Signed)
 Report given to PACU, vss

## 2023-02-12 NOTE — Op Note (Signed)
 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 Cloretta,                            last EGD July 2024 with small focus of                            adenocarcinoma in distal esophagus Medicines:                Monitored Anesthesia Care Procedure:                Pre-Anesthesia Assessment:                           - Prior to the procedure, a History and Physical                            was performed, and patient medications and                            allergies were reviewed. The patient's tolerance of                            previous anesthesia was also reviewed. The risks                            and benefits of the procedure and the sedation                            options and risks were discussed with the patient.                            All questions were answered, and informed consent                            was obtained. Prior Anticoagulants: The patient has                            taken no anticoagulant or antiplatelet agents. ASA                            Grade Assessment: III - A patient with severe                            systemic disease. After reviewing the risks and  benefits, the patient was deemed in satisfactory                            condition to undergo the procedure.                           After obtaining informed consent, the endoscope was                            passed under direct vision. Throughout the                            procedure, the patient's blood pressure, pulse, and                            oxygen saturations were  monitored continuously. The                            GIF [id number] #[id number] was introduced through the                            mouth, and advanced to the second part of duodenum.                            The upper GI endoscopy was accomplished without                            difficulty. The patient tolerated the procedure                            well. Scope In: Scope Out: Findings:                 Localized mucosal changes characterized by                            nodularity and scarring (radiation treatment                            changes) were found in the distal esophagus (38-39                            cm from the incisors). Biopsies were taken with a                            cold forceps for histology.                           Localized mild mucosal changes characterized by                            nodularity were found in the cardia (just distal to                            the Z-line). Biopsies were taken with a  cold                            forceps for histology.                           A small hiatal hernia was present.                           The gastroesophageal flap valve was visualized                            endoscopically and classified as Hill Grade IV (no                            fold, wide open lumen, hiatal hernia present).                           The exam of the stomach was otherwise normal.                           The examined duodenum was normal. Complications:            No immediate complications. Estimated Blood Loss:     Estimated blood loss was minimal. Impression:               - Slightly nodular and scarred mucosa in the distal                            esophagus. Biopsied.                           - Nodular mucosa in the cardia (just distal to                            Z-line). Biopsied.                           - Small hiatal hernia.                           - Normal examined duodenum. Recommendation:           -  Patient has a contact number available for                            emergencies. The signs and symptoms of potential                            delayed complications were discussed with the                            patient. Return to normal activities tomorrow.                            Written discharge instructions were provided to the  patient.                           - Resume previous diet.                           - Continue present medications.                           - Await pathology results.                           - Repeat upper endoscopy per oncology                            recommendation. Gordy CHRISTELLA Starch, MD 02/12/2023 10:48:59 AM This report has been signed electronically.

## 2023-02-12 NOTE — Progress Notes (Signed)
 Called to room to assist during endoscopic procedure.  Patient ID and intended procedure confirmed with present staff. Received instructions for my participation in the procedure from the performing physician.

## 2023-02-12 NOTE — Progress Notes (Signed)
 GASTROENTEROLOGY PROCEDURE H&P NOTE   Primary Care Physician: Lawrance Handing, MD    Reason for Procedure:  Surveillance in setting of esophageal cancer history  Plan:    EGD  Patient is appropriate for endoscopic procedure(s) in the ambulatory ([facility]) setting.  The nature of the procedure, as well as the risks, benefits, and alternatives were carefully and thoroughly reviewed with the patient. Ample time for discussion and questions allowed. The patient understood, was satisfied, and agreed to proceed.     HPI: Alison Garner is a 73 y.o. female who presents for EGD.  Medical history as below.  No recent chest pain or shortness of breath.  No abdominal pain today.  Past Medical History:  Diagnosis Date   Arthritis    Carotid bruit    Diverticulosis    Esophageal cancer (HCC)    External hemorrhoids    Hiatal hernia    History of radiation therapy    Esophagus- 01/08/21-02/16/21- Dr. Lynwood Nasuti   Hyperlipidemia    Hypothyroidism    Internal hemorrhoids    Irritable bowel syndrome    Kidney infection    UTI hx   Kidney stones    Lichen planus    Macular cyst, hole, or pseudohole, unspecified eye    Thyroid  disease    thyroid  nodule   Vitamin D deficiency     Past Surgical History:  Procedure Laterality Date   COLONOSCOPY  2017   JMP-MAC-good prep-tics/hems/normal colon   DILATION AND CURETTAGE OF UTERUS  1982   IR IMAGING GUIDED PORT INSERTION  05/17/2022   UPPER GASTROINTESTINAL ENDOSCOPY  12/2020    Prior to Admission medications   Medication Sig Start Date End Date Taking? Authorizing Provider  Cholecalciferol (VITAMIN D3) 50 MCG (2000 UT) CAPS Take 2,000 Units by mouth 2 (two) times daily.   Yes [provider]  ezetimibe -simvastatin  (VYTORIN ) 10-20 MG tablet Take 1 tablet by mouth every other day.   Yes [provider]  imipramine  (TOFRANIL ) 25 MG tablet Take 25 mg by mouth at bedtime. Takes 2 tablets in bedtime   Yes [provider]  levothyroxine  (SYNTHROID , LEVOTHROID) 25 MCG tablet Take 0.025 mcg by mouth daily before breakfast. 08/02/17  Yes [provider]  pantoprazole  (PROTONIX ) 40 MG tablet Take 1 tablet (40 mg total) by mouth 2 (two) times daily before a meal. 06/03/22  Yes Montrelle Eddings, Gordy HERO, MD  fluconazole  (DIFLUCAN ) 100 MG tablet Take 1 tablet (100 mg total) by mouth daily. Patient not taking: Reported on 06/18/2022 06/11/22   Debby Olam POUR, NP  lidocaine -prilocaine  (EMLA ) cream Apply 1 Application topically as needed (apply to [facility] site 1-2 hours before appt). Patient not taking: Reported on 02/12/2023 09/04/22   Debby Olam POUR, NP  ondansetron  (ZOFRAN ) 8 MG tablet Take 1 tablet (8 mg total) by mouth every 8 (eight) hours as needed for nausea or vomiting (May use day 3 after chemo as needed for nausea). Patient not taking: Reported on 06/18/2022 05/23/22   Cloretta Arley NOVAK, MD  potassium chloride  SA (KLOR-CON  M) 20 MEQ tablet Take 1 tablet (20 mEq total) by mouth daily. 12/11/22   Cloretta Arley NOVAK, MD  prochlorperazine  (COMPAZINE ) 10 MG tablet Take 1 tablet (10 mg total) by mouth every 6 (six) hours as needed for nausea or vomiting. Patient not taking: Reported on 12/11/2022 05/23/22   Cloretta Arley NOVAK, MD  potassium chloride  SA (KLOR-CON  M) 20 MEQ tablet Take 1 tablet (20 mEq total) by mouth daily. 06/04/22  Debby Olam POUR, NP    Current Outpatient Medications  Medication Sig Dispense Refill   Cholecalciferol (VITAMIN D3) 50 MCG (2000 UT) CAPS Take 2,000 Units by mouth 2 (two) times daily.     ezetimibe -simvastatin  (VYTORIN ) 10-20 MG tablet Take 1 tablet by mouth every other day.     imipramine  (TOFRANIL ) 25 MG tablet Take 25 mg by mouth at bedtime. Takes 2 tablets in bedtime     levothyroxine  (SYNTHROID , LEVOTHROID) 25 MCG tablet Take 0.025 mcg by mouth daily before breakfast.     pantoprazole  (PROTONIX ) 40 MG tablet Take 1 tablet (40 mg total) by mouth 2 (two) times daily before a meal. 180 tablet 3    fluconazole  (DIFLUCAN ) 100 MG tablet Take 1 tablet (100 mg total) by mouth daily. (Patient not taking: Reported on 06/18/2022) 4 tablet 0   lidocaine -prilocaine  (EMLA ) cream Apply 1 Application topically as needed (apply to [facility] site 1-2 hours before appt). (Patient not taking: Reported on 02/12/2023) 30 g 3   ondansetron  (ZOFRAN ) 8 MG tablet Take 1 tablet (8 mg total) by mouth every 8 (eight) hours as needed for nausea or vomiting (May use day 3 after chemo as needed for nausea). (Patient not taking: Reported on 06/18/2022) 20 tablet 0   potassium chloride  SA (KLOR-CON  M) 20 MEQ tablet Take 1 tablet (20 mEq total) by mouth daily. 90 tablet 0   prochlorperazine  (COMPAZINE ) 10 MG tablet Take 1 tablet (10 mg total) by mouth every 6 (six) hours as needed for nausea or vomiting. (Patient not taking: Reported on 12/11/2022) 30 tablet 0   Current Facility-Administered Medications  Medication Dose Route Frequency Provider Last Rate Last Admin   0.9 %  sodium chloride  infusion  500 mL Intravenous Once Elman Dettman, Gordy HERO, MD        Allergies as of 02/12/2023 - Review Complete 02/12/2023  Allergen Reaction Noted   Bactrim [sulfamethoxazole-trimethoprim] Rash 09/07/2015    Family History  Problem Relation Age of Onset   Heart disease Mother    Hypertension Mother    Heart disease Father    Heart failure Father    Thyroid  disease Brother    Heart disease Maternal Aunt    Other Maternal Aunt        MDS   Heart disease Maternal Uncle    Heart failure Maternal Uncle    Myasthenia gravis Paternal Aunt    Heart disease Paternal Uncle    Heart failure Paternal Uncle    Colon cancer Neg Hx    Esophageal cancer Neg Hx    Stomach cancer Neg Hx    Rectal cancer Neg Hx    Colon polyps Neg Hx     Social History   Socioeconomic History   Marital status: Single    Spouse name: Not on file   Number of children: Not on file   Years of education: Not on file   Highest education level: Not on file   Occupational History   Not on file  Tobacco Use   Smoking status: Every Day    Current packs/day: 0.50    Average packs/day: 0.5 packs/day for 50.0 years (25.0 ttl pk-yrs)    Types: Cigarettes   Smokeless tobacco: Never  Vaping Use   Vaping status: Never Used  Substance and Sexual Activity   Alcohol use: Yes    Comment: rare   Drug use: No   Sexual activity: Not on file  Other Topics Concern   Not on file  Social History Narrative  Not on file   Social Drivers of Health   Financial Resource Strain: Not on file  Food Insecurity: No Food Insecurity (06/04/2022)   Hunger Vital Sign    Worried About Running Out of Food in the Last Year: Never true    Ran Out of Food in the Last Year: Never true  Transportation Needs: No Transportation Needs (06/04/2022)   PRAPARE - Administrator, Civil Service (Medical): No    Lack of Transportation (Non-Medical): No  Physical Activity: Not on file  Stress: Not on file  Social Connections: Not on file  Intimate Partner Violence: Not At Risk (06/04/2022)   Humiliation, Afraid, Rape, and Kick questionnaire    Fear of Current or Ex-Partner: No    Emotionally Abused: No    Physically Abused: No    Sexually Abused: No    Physical Exam: Vital signs in last 24 hours: @BP  (!) 145/75   Pulse 82   Temp (!) 97.3 F (36.3 C) (Temporal)   Ht 5' 5 (1.651 m)   Wt 134 lb (60.8 kg)   SpO2 97%   BMI 22.30 kg/m  GEN: NAD EYE: Sclerae anicteric ENT: MMM CV: Non-tachycardic Pulm: CTA b/l GI: Soft, NT/ND NEURO:  Alert & Oriented x 3   Gordy Starch, MD [facility]  02/12/2023 10:18 AM

## 2023-02-13 ENCOUNTER — Telehealth: Payer: Self-pay

## 2023-02-13 NOTE — Telephone Encounter (Signed)
  Follow up Call-     02/12/2023    9:14 AM 08/14/2022    9:20 AM 04/15/2022    9:42 AM 04/30/2021    9:32 AM 12/13/2020   11:11 AM  Call back number  Post procedure Call Back phone  # [phone]  Permission to leave phone message Yes Yes Yes Yes Yes     Patient questions:  Do you have a fever, pain , or abdominal swelling? No. Pain Score  0 *  Have you tolerated food without any problems? Yes.    Have you been able to return to your normal activities? Yes.    Do you have any questions about your discharge instructions: Diet   No. Medications  No. Follow up visit  No.  Do you have questions or concerns about your Care? No.  Actions: * If pain score is 4 or above: No action needed, pain <4.

## 2023-02-17 LAB — SURGICAL PATHOLOGY

## 2023-02-19 ENCOUNTER — Inpatient Hospital Stay: Payer: Medicare Other | Attending: Oncology | Admitting: Nurse Practitioner

## 2023-02-19 ENCOUNTER — Encounter: Payer: Self-pay | Admitting: *Deleted

## 2023-02-19 ENCOUNTER — Encounter: Payer: Self-pay | Admitting: Nurse Practitioner

## 2023-02-19 VITALS — BP 110/64 | HR 87 | Temp 97.9°F | Resp 18 | Ht 65.0 in | Wt 136.3 lb

## 2023-02-19 DIAGNOSIS — J449 Chronic obstructive pulmonary disease, unspecified: Secondary | ICD-10-CM | POA: Diagnosis not present

## 2023-02-19 DIAGNOSIS — Z923 Personal history of irradiation: Secondary | ICD-10-CM | POA: Diagnosis not present

## 2023-02-19 DIAGNOSIS — K649 Unspecified hemorrhoids: Secondary | ICD-10-CM | POA: Diagnosis not present

## 2023-02-19 DIAGNOSIS — Z5111 Encounter for antineoplastic chemotherapy: Secondary | ICD-10-CM | POA: Insufficient documentation

## 2023-02-19 DIAGNOSIS — L271 Localized skin eruption due to drugs and medicaments taken internally: Secondary | ICD-10-CM | POA: Diagnosis not present

## 2023-02-19 DIAGNOSIS — K589 Irritable bowel syndrome without diarrhea: Secondary | ICD-10-CM | POA: Insufficient documentation

## 2023-02-19 DIAGNOSIS — D6959 Other secondary thrombocytopenia: Secondary | ICD-10-CM | POA: Diagnosis not present

## 2023-02-19 DIAGNOSIS — E039 Hypothyroidism, unspecified: Secondary | ICD-10-CM | POA: Insufficient documentation

## 2023-02-19 DIAGNOSIS — C160 Malignant neoplasm of cardia: Secondary | ICD-10-CM | POA: Diagnosis not present

## 2023-02-19 DIAGNOSIS — E785 Hyperlipidemia, unspecified: Secondary | ICD-10-CM | POA: Diagnosis not present

## 2023-02-19 DIAGNOSIS — C155 Malignant neoplasm of lower third of esophagus: Secondary | ICD-10-CM

## 2023-02-19 DIAGNOSIS — Z79633 Long term (current) use of mitotic inhibitor: Secondary | ICD-10-CM | POA: Insufficient documentation

## 2023-02-19 DIAGNOSIS — G620 Drug-induced polyneuropathy: Secondary | ICD-10-CM

## 2023-02-19 NOTE — Progress Notes (Signed)
 PATIENT NAVIGATOR PROGRESS NOTE  Name: Alison Garner Date: 02/19/2023 MRN: [id number]  DOB: 1950/04/13   Reason for visit:  Molecular testing  Comments:  PDL-1 and Foundation One testing requested on accession number [id number]    Time spent counseling/coordinating care: 30-45 minutes

## 2023-02-19 NOTE — Progress Notes (Signed)
[facility] OFFICE PROGRESS NOTE   Diagnosis: Esophagus cancer  INTERVAL HISTORY:   Alison Garner returns as scheduled to review the results of the recent upper endoscopy/biopsy.  She feels well.  No dysphagia.  She has a good appetite.  No interim illness/infection.  Objective:  Vital signs in last 24 hours:  Blood pressure 110/64, pulse 87, temperature 97.9 F (36.6 C), temperature source Temporal, resp. rate 18, height 5\' 5"  (1.651 m), weight 136 lb 4.8 oz (61.8 kg), SpO2 96%.     Lymphatics: No palpable cervical, supraclavicular or axillary lymph nodes. Resp: Lungs clear bilaterally. Cardio: Regular rate and rhythm. GI: No hepatosplenomegaly. Vascular: No leg edema. Port-A-Cath without erythema.  Lab Results:  Lab Results  Component Value Date   WBC 6.3 01/22/2023   HGB 13.7 01/22/2023   HCT 39.0 01/22/2023   MCV 95.8 01/22/2023   PLT 160 01/22/2023   NEUTROABS 4.1 01/22/2023    Imaging:  No results found.  Medications: I have reviewed the patient's current medications.  Assessment/Plan: Distal esophagus cancer Esophagram 11/21/2020-moderate to severe esophageal dysmotility, narrowing of the distal esophagus just above the GE junction to 4 mm, 13 mm barium tablet stuck in the distal esophagus Upper endoscopy 12/13/2020-severe esophagitis with nodularity and stricturing at 36 cm-adenocarcinoma, HER2 negative, no loss of mismatch repair protein expression, PD-L1 combined positive score less than 1% CT chest 12/14/2020-accentuated density at the distal esophageal lumen above a small type I hiatal hernia concerning for malignancy, nonpathologically enlarged AP window and right hilar nodes PET 12/21/2020-abnormal FDG uptake at the distal esophagus, no evidence of nodal or distant metastatic disease Radiation 01/08/2021-02/16/2021 Cycle 1 weekly Taxol/carboplatin 01/08/2021 Cycle 2 weekly Taxol/carboplatin 01/15/2021 Treatment held 01/22/2021 due to  neutropenia Positive COVID test 02/02/2021 Taxol/carboplatin 02/15/2021 Endoscopy 04/30/2021-shallow ulceration at the distal esophagus, nodularity at the gastric cardia-biopsies negative for malignancy CTs 04/02/2022-small mediastinal and right hilar lymph nodes.  Esophagus is mildly patulous decreased from previous in the mid to upper aspect of the mediastinum.  More distally the wall thickening of the esophagus appears slightly decreased.  Small hiatal hernia.  The area of thickening does correspond to the abnormality on PET-CT.  Emphysema. Endoscopy 04/15/2022-Short segment salmon-colored mucosa 1 cm above the Z-line.  Scarring at the gastroesophageal junction/Z-line with nonobstructing narrowing.  Nodular mucosa in the gastric cardia immediately distal to the Z-line.  Pathology on GE junction nodule-invasive moderately differentiated adenocarcinoma, tumor arises within a background of high-grade dysplasia, mild chronic active gastritis with foveolar hyperplasia, negative for squamous epithelium and intestinal metaplasia.  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  Negative for HER2 by IHC, PD-L1 CPS 2% PET scan 04/25/2022-increased radiotracer uptake associated with the distal esophagus at the level of the GE junction.  No evidence of nodal metastasis or distant metastatic disease. Cycle 1 FOLFOX 06/04/2022 Cycle 2 FOLFOX 06/18/2022, oxaliplatin dose reduced secondary thrombocytopenia Cycle 3 FOLFOX 06/24/2022, oxaliplatin dose reduced secondary thrombocytopenia, 5-FU dose reduced secondary to hand/foot syndrome 07/16/2022 chemotherapy held due to thrombocytopenia, dehydration Cycle 4 FOLFOX 07/23/2022, oxaliplatin dose reduced due to thrombocytopenia Cycle 5 FOLFOX 08/05/2022 CT chest 08/13/2022-mild focal wall thickening at the distal esophagus, 8 mm gastropathic node-not FDG avid on PET, unchanged small mediastinal nodes no evidence of metastatic disease in the chest Upper endoscopy  08/14/2022-benign-appearing mild stenosis in the distal esophagus-microscopic focus of adenocarcinoma Cycle 6 FOLFOX 09/04/2022 Maintenance 5-fluorouracil via pump every 2 weeks beginning 10/30/2022 5-fluorouracil infusion 11/14/2022 5-FU  infusion 11/27/2022 5-FU infusion 12/11/2022 5-FU infusion 12/25/2022  5-FU infusion 01/08/2023 CT chest 01/16/2023-stable distal esophageal thickening, no evidence of metastatic disease Upper endoscopy 02/12/2023-localized mucosal changes characterized by nodularity and scarring in the distal esophagus (cardiac mucosa with mild chronic nonspecific carditis, negative for intestinal metaplasia or dysplasia).  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  We reviewed the upper endoscopy and biopsy reports with her and her daughter.  They understand nodularity was noted at the distal esophagus and cardia.  Biopsy at the cardia showed adenocarcinoma.  Dr. Truett Perna recommends systemic therapy with Taxol/ramucirumab.  We reviewed potential side effects including bone marrow toxicity, nausea, hair loss.  We discussed the neuropathy associated with Taxol.  We reviewed potential side effects associated with ramucirumab including bleeding, delayed wound healing, bowel perforation, blood clots/stroke, hypertension, infusion related reaction, posterior reversible encephalopathy syndrome, proteinuria.  She was provided with printed information.  She will contact our  office with her decision in a few days.  We are requesting foundation 1, PD-L1 and CLDN18.2 on the biopsy material.  She will return for follow-up and possible treatment in 2 weeks.  We are available to see her sooner if needed.  Patient seen with Dr. Truett Perna.    [name] ANP/GNP-BC   02/19/2023  1:59 PM This was a shared visit with [name].  We reviewed the endoscopy findings with Alison Garner and her daughter.  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  We discussed treatment options.  She declines surgery.  I recommend paclitaxel/ramucirumab as a standard salvage systemic therapy.  We also discussed the small chance of a clinical response with immunotherapy.  We are submitting the biopsy tissue for NGS testing.  A treatment plan was entered today.  I was present for greater than 50% of today's visit.  I performed medical decision making.  [name], MD

## 2023-02-19 NOTE — Progress Notes (Signed)
 Additional testing added on NWGN56-2

## 2023-02-20 ENCOUNTER — Encounter: Payer: Self-pay | Admitting: Oncology

## 2023-02-20 ENCOUNTER — Other Ambulatory Visit: Payer: Self-pay

## 2023-02-20 NOTE — Progress Notes (Signed)
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)  START ON PATHWAY REGIMEN - Gastroesophageal     A cycle is every 28 days:     Ramucirumab      Paclitaxel   **Always confirm dose/schedule in your pharmacy ordering system**  Patient Characteristics: Distant Metastases (cM1/pM1) / Locally Recurrent Disease, Adenocarcinoma - Esophageal, GE Junction, and Gastric, Second Line, MSS/pMMR or MSI Unknown Therapeutic Status: Local Recurrence (No Additional Staging) Histology: Adenocarcinoma Disease Classification: Esophageal Line of Therapy: Second Line Microsatellite/Mismatch Repair Status: Unknown Intent of Therapy: Non-Curative / Palliative Intent, Discussed with Patient

## 2023-02-21 ENCOUNTER — Other Ambulatory Visit: Payer: Self-pay

## 2023-02-21 ENCOUNTER — Encounter: Payer: Self-pay | Admitting: *Deleted

## 2023-02-21 NOTE — Progress Notes (Signed)
Received fax from [facility] that order for testing received and they are working on obtaining the specimen.

## 2023-02-24 ENCOUNTER — Other Ambulatory Visit: Payer: Self-pay | Admitting: Nurse Practitioner

## 2023-02-24 ENCOUNTER — Other Ambulatory Visit: Payer: Self-pay

## 2023-02-24 ENCOUNTER — Encounter: Payer: Self-pay | Admitting: Oncology

## 2023-02-24 DIAGNOSIS — C155 Malignant neoplasm of lower third of esophagus: Secondary | ICD-10-CM

## 2023-02-24 MED ORDER — DEXAMETHASONE 2 MG PO TABS: 10.0000 mg | ORAL_TABLET | ORAL | 0 refills | Status: DC

## 2023-02-28 NOTE — Progress Notes (Signed)
New fax from [facility] that tissue has been received and testing started.

## 2023-03-02 ENCOUNTER — Other Ambulatory Visit: Payer: Self-pay | Admitting: Oncology

## 2023-03-03 ENCOUNTER — Encounter: Payer: Self-pay | Admitting: Nurse Practitioner

## 2023-03-04 ENCOUNTER — Inpatient Hospital Stay (HOSPITAL_BASED_OUTPATIENT_CLINIC_OR_DEPARTMENT_OTHER): Payer: Medicare Other | Admitting: Oncology

## 2023-03-04 ENCOUNTER — Inpatient Hospital Stay: Payer: Medicare Other

## 2023-03-04 VITALS — BP 133/60 | HR 98 | Temp 97.8°F | Resp 18 | Ht 65.0 in | Wt 136.8 lb

## 2023-03-04 VITALS — BP 136/70 | HR 87 | Resp 18

## 2023-03-04 DIAGNOSIS — C155 Malignant neoplasm of lower third of esophagus: Secondary | ICD-10-CM

## 2023-03-04 DIAGNOSIS — Z5111 Encounter for antineoplastic chemotherapy: Secondary | ICD-10-CM | POA: Diagnosis not present

## 2023-03-04 DIAGNOSIS — C160 Malignant neoplasm of cardia: Secondary | ICD-10-CM

## 2023-03-04 LAB — CBC WITH DIFFERENTIAL (CANCER CENTER ONLY)
Abs Immature Granulocytes: 0.01 10*3/uL (ref 0.00–0.07)
Basophils Absolute: 0 10*3/uL (ref 0.0–0.1)
Basophils Relative: 0 %
Eosinophils Absolute: 0.1 10*3/uL (ref 0.0–0.5)
Eosinophils Relative: 1 %
HCT: 41 % (ref 36.0–46.0)
Hemoglobin: 14.2 g/dL (ref 12.0–15.0)
Immature Granulocytes: 0 %
Lymphocytes Relative: 9 %
Lymphs Abs: 0.6 10*3/uL — ABNORMAL LOW (ref 0.7–4.0)
MCH: 32.9 pg (ref 26.0–34.0)
MCHC: 34.6 g/dL (ref 30.0–36.0)
MCV: 95.1 fL (ref 80.0–100.0)
Monocytes Absolute: 0 10*3/uL — ABNORMAL LOW (ref 0.1–1.0)
Monocytes Relative: 1 %
Neutro Abs: 5.8 10*3/uL (ref 1.7–7.7)
Neutrophils Relative %: 89 %
Platelet Count: 181 10*3/uL (ref 150–400)
RBC: 4.31 MIL/uL (ref 3.87–5.11)
RDW: 13.8 % (ref 11.5–15.5)
WBC Count: 6.5 10*3/uL (ref 4.0–10.5)
nRBC: 0 % (ref 0.0–0.2)

## 2023-03-04 LAB — CMP (CANCER CENTER ONLY)
ALT: 16 U/L (ref 0–44)
AST: 21 U/L (ref 15–41)
Albumin: 4.2 g/dL (ref 3.5–5.0)
Alkaline Phosphatase: 86 U/L (ref 38–126)
Anion gap: 11 (ref 5–15)
BUN: 10 mg/dL (ref 8–23)
CO2: 23 mmol/L (ref 22–32)
Calcium: 9.7 mg/dL (ref 8.9–10.3)
Chloride: 96 mmol/L — ABNORMAL LOW (ref 98–111)
Creatinine: 0.89 mg/dL (ref 0.44–1.00)
GFR, Estimated: >60 mL/min (ref >60)
Glucose, Bld: 249 mg/dL — ABNORMAL HIGH (ref 70–99)
Potassium: 3.9 mmol/L (ref 3.5–5.1)
Sodium: 130 mmol/L — ABNORMAL LOW (ref 135–145)
Total Bilirubin: 0.6 mg/dL (ref 0.0–1.2)
Total Protein: 7.4 g/dL (ref 6.5–8.1)

## 2023-03-04 LAB — CEA (ACCESS): CEA (CHCC): 13.92 ng/mL — ABNORMAL HIGH (ref 0.00–5.00)

## 2023-03-04 LAB — TSH: TSH: 1.385 u[IU]/mL (ref 0.350–4.500)

## 2023-03-04 LAB — TOTAL PROTEIN, URINE DIPSTICK: Protein, ur: NEGATIVE mg/dL

## 2023-03-04 MED ORDER — SODIUM CHLORIDE 0.9% FLUSH
10.0000 mL | INTRAVENOUS | Status: DC | PRN
Start: 2023-03-04 — End: 2023-03-04
  Administered 2023-03-04: 10 mL

## 2023-03-04 MED ORDER — DIPHENHYDRAMINE HCL 50 MG/ML IJ SOLN
25.0000 mg | Freq: Once | INTRAMUSCULAR | Status: AC
  Administered 2023-03-04: 25 mg via INTRAVENOUS
  Filled 2023-03-04: qty 1

## 2023-03-04 MED ORDER — FAMOTIDINE IN NACL 20-0.9 MG/50ML-% IV SOLN
20.0000 mg | Freq: Once | INTRAVENOUS | Status: AC
  Administered 2023-03-04: 20 mg via INTRAVENOUS
  Filled 2023-03-04: qty 50

## 2023-03-04 MED ORDER — HEPARIN SOD (PORK) LOCK FLUSH 100 UNIT/ML IV SOLN
500.0000 [IU] | Freq: Once | INTRAVENOUS | Status: AC | PRN
  Administered 2023-03-04: 500 [IU]

## 2023-03-04 MED ORDER — SODIUM CHLORIDE 0.9 % IV SOLN
80.0000 mg/m2 | Freq: Once | INTRAVENOUS | Status: AC
  Administered 2023-03-04: 132 mg via INTRAVENOUS
  Filled 2023-03-04: qty 22

## 2023-03-04 MED ORDER — SODIUM CHLORIDE 0.9 % IV SOLN: INTRAVENOUS | Status: DC

## 2023-03-04 MED ORDER — SODIUM CHLORIDE 0.9 % IV SOLN
8.0000 mg/kg | Freq: Once | INTRAVENOUS | Status: AC
  Administered 2023-03-04: 500 mg via INTRAVENOUS
  Filled 2023-03-04: qty 50

## 2023-03-04 MED ORDER — DEXAMETHASONE SODIUM PHOSPHATE 10 MG/ML IJ SOLN
10.0000 mg | Freq: Once | INTRAMUSCULAR | Status: AC
Start: 2023-03-04 — End: 2023-03-04
  Administered 2023-03-04: 10 mg via INTRAVENOUS
  Filled 2023-03-04: qty 1

## 2023-03-04 NOTE — Progress Notes (Signed)
[facility] OFFICE PROGRESS NOTE   Diagnosis: Esophagus cancer  INTERVAL HISTORY:   Ms. Schomburg returns as scheduled.  She has decided to proceed with paclitaxel/ramucirumab.  She took Decadron prophylaxis at home.  No dysphagia.  She noted an inflamed "cyst "in the right groin yesterday.  She was able to express pus from the cyst.  Objective:  Vital signs in last 24 hours:  Blood pressure 133/60, pulse 98, temperature 97.8 F (36.6 C), temperature source Temporal, resp. rate 18, height 5\' 5"  (1.651 m), weight 136 lb 12.8 oz (62.1 kg), SpO2 100%.    Resp: Lungs clear bilaterally Cardio: Distant heart sounds, regular rate and rhythm GI: No hepatosplenomegaly Vascular: No leg edema  Skin: 2 cm indurated cyst at the right groin, I was able to express a small amount of tan material.  Portacath/PICC-without erythema  Lab Results:  Lab Results  Component Value Date   WBC 6.5 03/04/2023   HGB 14.2 03/04/2023   HCT 41.0 03/04/2023   MCV 95.1 03/04/2023   PLT 181 03/04/2023   NEUTROABS 5.8 03/04/2023    CMP  Lab Results  Component Value Date   NA 130 (L) 03/04/2023   K 3.9 03/04/2023   CL 96 (L) 03/04/2023   CO2 23 03/04/2023   GLUCOSE 249 (H) 03/04/2023   BUN 10 03/04/2023   CREATININE 0.89 03/04/2023   CALCIUM 9.7 03/04/2023   PROT 7.4 03/04/2023   ALBUMIN 4.2 03/04/2023   AST 21 03/04/2023   ALT 16 03/04/2023   ALKPHOS 86 03/04/2023   BILITOT 0.6 03/04/2023   GFRNONAA >60 03/04/2023    Lab Results  Component Value Date   CEA 13.92 (H) 03/04/2023     Medications: I have reviewed the patient's current medications.   Assessment/Plan: Distal esophagus cancer Esophagram 11/21/2020-moderate to severe esophageal dysmotility, narrowing of the distal esophagus just above the GE junction to 4 mm, 13 mm barium tablet stuck in the distal esophagus Upper endoscopy 12/13/2020-severe esophagitis with nodularity and stricturing at 36 cm-adenocarcinoma,  HER2 negative, no loss of mismatch repair protein expression, PD-L1 combined positive score less than 1% CT chest 12/14/2020-accentuated density at the distal esophageal lumen above a small type I hiatal hernia concerning for malignancy, nonpathologically enlarged AP window and right hilar nodes PET 12/21/2020-abnormal FDG uptake at the distal esophagus, no evidence of nodal or distant metastatic disease Radiation 01/08/2021-02/16/2021 Cycle 1 weekly Taxol/carboplatin 01/08/2021 Cycle 2 weekly Taxol/carboplatin 01/15/2021 Treatment held 01/22/2021 due to neutropenia Positive COVID test 02/02/2021 Taxol/carboplatin 02/15/2021 Endoscopy 04/30/2021-shallow ulceration at the distal esophagus, nodularity at the gastric cardia-biopsies negative for malignancy CTs 04/02/2022-small mediastinal and right hilar lymph nodes.  Esophagus is mildly patulous decreased from previous in the mid to upper aspect of the mediastinum.  More distally the wall thickening of the esophagus appears slightly decreased.  Small hiatal hernia.  The area of thickening does correspond to the abnormality on PET-CT.  Emphysema. Endoscopy 04/15/2022-Short segment salmon-colored mucosa 1 cm above the Z-line.  Scarring at the gastroesophageal junction/Z-line with nonobstructing narrowing.  Nodular mucosa in the gastric cardia immediately distal to the Z-line.  Pathology on GE junction nodule-invasive moderately differentiated adenocarcinoma, tumor arises within a background of high-grade dysplasia, mild chronic active gastritis with foveolar hyperplasia, negative for squamous epithelium and intestinal metaplasia.  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  Negative for HER2 by IHC, PD-L1 CPS 2% PET scan 04/25/2022-increased radiotracer uptake associated with the distal esophagus at the level of the GE junction.  No  evidence of nodal metastasis or distant metastatic disease. Cycle 1 FOLFOX 06/04/2022 Cycle 2 FOLFOX  06/18/2022, oxaliplatin dose reduced secondary thrombocytopenia Cycle 3 FOLFOX 06/24/2022, oxaliplatin dose reduced secondary thrombocytopenia, 5-FU dose reduced secondary to hand/foot syndrome 07/16/2022 chemotherapy held due to thrombocytopenia, dehydration Cycle 4 FOLFOX 07/23/2022, oxaliplatin dose reduced due to thrombocytopenia Cycle 5 FOLFOX 08/05/2022 CT chest 08/13/2022-mild focal wall thickening at the distal esophagus, 8 mm gastropathic node-not FDG avid on PET, unchanged small mediastinal nodes no evidence of metastatic disease in the chest Upper endoscopy 08/14/2022-benign-appearing mild stenosis in the distal esophagus-microscopic focus of adenocarcinoma Cycle 6 FOLFOX 09/04/2022 Maintenance 5-fluorouracil via pump every 2 weeks beginning 10/30/2022 5-fluorouracil infusion 11/14/2022 5-FU  infusion 11/27/2022 5-FU infusion 12/11/2022 5-FU infusion 12/25/2022 5-FU infusion 01/08/2023 CT chest 01/16/2023-stable distal esophageal thickening, no evidence of metastatic disease Upper endoscopy 02/12/2023-localized mucosal changes characterized by nodularity and scarring in the distal esophagus (cardiac mucosa with mild chronic nonspecific carditis, negative for intestinal metaplasia or dysplasia).  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  She will begin treatment with Taxol/ramucirumab today.  She has an inflamed sebaceous cyst in the right  groin.  The lesion has drained.  She will use warm compresses and Neosporin on this lesion.  She will return for an office visit and treatment in 2 weeks.  Thornton Papas, MD  03/04/2023  10:10 AM

## 2023-03-04 NOTE — Patient Instructions (Signed)
[facility]   Discharge Instructions: Thank you for choosing [facility] to provide your oncology and hematology care.   If you have a lab appointment with the Cancer Center, please go directly to the Cancer Center and check in at the registration area.   Wear comfortable clothing and clothing appropriate for easy access to any Portacath or PICC line.   We strive to give you quality time with your provider. You may need to reschedule your appointment if you arrive late (15 or more minutes).  Arriving late affects you and other patients whose appointments are after yours.  Also, if you miss three or more appointments without notifying the office, you may be dismissed from the clinic at the provider's discretion.      For prescription refill requests, have your pharmacy contact our office and allow 72 hours for refills to be completed.    Today you received the following chemotherapy and/or immunotherapy agents Ramucirumab (CYRAMZA) & Paclitaxel (TAXOL).      To help prevent nausea and vomiting after your treatment, we encourage you to take your nausea medication as directed.  BELOW ARE SYMPTOMS THAT SHOULD BE REPORTED IMMEDIATELY: *FEVER GREATER THAN 100.4 F (38 C) OR HIGHER *CHILLS OR SWEATING *NAUSEA AND VOMITING THAT IS NOT CONTROLLED WITH YOUR NAUSEA MEDICATION *UNUSUAL SHORTNESS OF BREATH *UNUSUAL BRUISING OR BLEEDING *URINARY PROBLEMS (pain or burning when urinating, or frequent urination) *BOWEL PROBLEMS (unusual diarrhea, constipation, pain near the anus) TENDERNESS IN MOUTH AND THROAT WITH OR WITHOUT PRESENCE OF ULCERS (sore throat, sores in mouth, or a toothache) UNUSUAL RASH, SWELLING OR PAIN  UNUSUAL VAGINAL DISCHARGE OR ITCHING   Items with * indicate a potential emergency and should be followed up as soon as possible or go to the Emergency Department if any problems should occur.  Please show the  CHEMOTHERAPY ALERT CARD or IMMUNOTHERAPY ALERT CARD at check-in to the Emergency Department and triage nurse.  Should you have questions after your visit or need to cancel or reschedule your appointment, please contact [facility]  Dept: [phone]  and follow the prompts.  Office hours are 8:00 a.m. to 4:30 p.m. Monday - Friday. Please note that voicemails left after 4:00 p.m. may not be returned until the following business day.  We are closed weekends and major holidays. You have access to a nurse at all times for urgent questions. Please call the main number to the clinic Dept: [phone] and follow the prompts.   For any non-urgent questions, you may also contact your provider using MyChart. We now offer e-Visits for anyone 67 and older to request care online for non-urgent symptoms. For details visit [email or web address].   Also download the MyChart app! Go to the app store, search "MyChart", open the app, select [place], and log in with your MyChart username and password.  Ramucirumab Injection What is this medication? RAMUCIRUMAB (ra mue SIR ue mab) treats some types of cancer. It works by blocking a protein that causes cancer cells to grow and multiply. This helps to slow or stop the spread of cancer cells. It is a monoclonal antibody. This medicine may be used for other purposes; ask your health care provider or pharmacist if you have questions. COMMON BRAND NAME(S): Cyramza What should I tell my care team before I take this medication? They need to know if you have any of  these conditions: Blood clots Having or recent surgery Heart attack High blood pressure History of a tear in your stomach or intestines Liver disease Protein in your urine Stomach bleeding Stroke Thyroid disease An unusual or allergic reaction to ramucirumab, other medications, foods, dyes, or preservatives Pregnant or trying to get  pregnant Breast-feeding How should I use this medication? This medication is injected into a vein. It is given by your care team in a hospital or clinic setting. Talk to your care team about the use of this medication in children. Special care may be needed. Overdosage: If you think you have taken too much of this medicine contact a poison control center or emergency room at once. NOTE: This medicine is only for you. Do not share this medicine with others. What if I miss a dose? Keep appointments for follow-up doses. It is important not to miss your dose. Call your care team if you are unable to keep an appointment. What may interact with this medication? Interactions have not been studied. This list may not describe all possible interactions. Give your health care provider a list of all the medicines, herbs, non-prescription drugs, or dietary supplements you use. Also tell them if you smoke, drink alcohol, or use illegal drugs. Some items may interact with your medicine. What should I watch for while using this medication? Your condition will be monitored carefully while you are receiving this medication. You may need blood work while taking this medication. This medication may make you feel generally unwell. This is not uncommon as chemotherapy can affect health cells as well as cancer cells. Report any side effects. Continue your course of treatment even though you feel ill unless your care team tells you to stop. This medication may increase your risk to bruise or bleed. Call your care team if you notice any unusual bleeding. Before having surgery, talk to your care team to make sure it is ok. This medication can increase the risk of poor healing of your surgical site or wound. You will need to stop this medication for 28 days before surgery. After surgery, wait at least 2 weeks before restarting this medication. Make sure the surgical site or wound is healed enough before restarting this  medication. Talk to your care team if questions. Talk to your care team if you may be pregnant. Serious birth defects can occur if you take this medication during pregnancy and for 3 months after the last dose. You will need a negative pregnancy test before starting this medication. Contraception is recommended while taking this medication and for 3 months after the last dose. Your care team can help you find the option that works for you. Do not breastfeed while taking this medication and for 2 months after the last dose. This medication may cause infertility. Talk to your care team if you are concerned about your fertility. What side effects may I notice from receiving this medication? Side effects that you should report to your care team as soon as possible: Allergic reactions--skin rash, itching, hives, swelling of the face, lips, tongue, or throat Bleeding--bloody or black, tar-like stools, vomiting blood or brown material that looks like coffee grounds, red or dark brown urine, small red or purple spots on skin, unusual bruising or bleeding Dizziness, loss of balance or coordination, confusion or trouble speaking Heart attack--pain or tightness in the chest, shoulders, arms, or jaw, nausea, shortness of breath, cold or clammy skin, feeling faint or lightheaded Increase in blood pressure Infection--fever, chills, cough,  sore throat, wounds that don't heal, pain or trouble when passing urine, general feeling of discomfort or being unwell Infusion reactions--chest pain, shortness of breath or trouble breathing, feeling faint or lightheaded Kidney injury--decrease in the amount of urine, swelling of the ankles, hands, or feet Liver injury--right upper belly pain, loss of appetite, nausea, light-colored stool, dark yellow or brown urine, yellowing skin or eyes, unusual weakness or fatigue Low thyroid levels (hypothyroidism)--unusual weakness or fatigue, increased sensitivity to cold, constipation,  hair loss, dry skin, weight gain, feelings of depression Stomach pain that is severe, does not go away, or gets worse Stroke--sudden numbness or weakness of the face, arm, or leg, trouble speaking, confusion, trouble walking, loss of balance or coordination, dizziness, severe headache, change in vision Sudden and severe headache, confusion, change in vision, seizures, which may be signs of posterior reversible encephalopathy syndrome (PRES) Side effects that usually do not require medical attention (report to your care team if they continue or are bothersome): Diarrhea Fatigue Stomach pain Swelling of the ankles, hands, or feet This list may not describe all possible side effects. Call your doctor for medical advice about side effects. You may report side effects to FDA at 1-800-FDA-1088. Where should I keep my medication? This medication is given in a hospital or clinic. It will not be stored at home. NOTE: This sheet is a summary. It may not cover all possible information. If you have questions about this medicine, talk to your doctor, pharmacist, or health care provider.  2024 Elsevier/Gold Standard (2021-06-14 00:00:00)  Paclitaxel Injection What is this medication? PACLITAXEL (PAK li TAX el) treats some types of cancer. It works by slowing down the growth of cancer cells. This medicine may be used for other purposes; ask your health care provider or pharmacist if you have questions. COMMON BRAND NAME(S): Onxol, Taxol What should I tell my care team before I take this medication? They need to know if you have any of these conditions: Heart disease Liver disease Low white blood cell levels An unusual or allergic reaction to paclitaxel, other medications, foods, dyes, or preservatives If you or your partner are pregnant or trying to get pregnant Breast-feeding How should I use this medication? This medication is injected into a vein. It is given by your care team in a hospital or  clinic setting. Talk to your care team about the use of this medication in children. While it may be given to children for selected conditions, precautions do apply. Overdosage: If you think you have taken too much of this medicine contact a poison control center or emergency room at once. NOTE: This medicine is only for you. Do not share this medicine with others. What if I miss a dose? Keep appointments for follow-up doses. It is important not to miss your dose. Call your care team if you are unable to keep an appointment. What may interact with this medication? Do not take this medication with any of the following: Live virus vaccines Other medications may affect the way this medication works. Talk with your care team about all of the medications you take. They may suggest changes to your treatment plan to lower the risk of side effects and to make sure your medications work as intended. This list may not describe all possible interactions. Give your health care provider a list of all the medicines, herbs, non-prescription drugs, or dietary supplements you use. Also tell them if you smoke, drink alcohol, or use illegal drugs. Some items may interact  with your medicine. What should I watch for while using this medication? Your condition will be monitored carefully while you are receiving this medication. You may need blood work while taking this medication. This medication may make you feel generally unwell. This is not uncommon as chemotherapy can affect healthy cells as well as cancer cells. Report any side effects. Continue your course of treatment even though you feel ill unless your care team tells you to stop. This medication can cause serious allergic reactions. To reduce the risk, your care team may give you other medications to take before receiving this one. Be sure to follow the directions from your care team. This medication may increase your risk of getting an infection. Call your care  team for advice if you get a fever, chills, sore throat, or other symptoms of a cold or flu. Do not treat yourself. Try to avoid being around people who are sick. This medication may increase your risk to bruise or bleed. Call your care team if you notice any unusual bleeding. Be careful brushing or flossing your teeth or using a toothpick because you may get an infection or bleed more easily. If you have any dental work done, tell your dentist you are receiving this medication. Talk to your care team if you may be pregnant. Serious birth defects can occur if you take this medication during pregnancy. Talk to your care team before breastfeeding. Changes to your treatment plan may be needed. What side effects may I notice from receiving this medication? Side effects that you should report to your care team as soon as possible: Allergic reactions--skin rash, itching, hives, swelling of the face, lips, tongue, or throat Heart rhythm changes--fast or irregular heartbeat, dizziness, feeling faint or lightheaded, chest pain, trouble breathing Increase in blood pressure Infection--fever, chills, cough, sore throat, wounds that don't heal, pain or trouble when passing urine, general feeling of discomfort or being unwell Low blood pressure--dizziness, feeling faint or lightheaded, blurry vision Low red blood cell level--unusual weakness or fatigue, dizziness, headache, trouble breathing Painful swelling, warmth, or redness of the skin, blisters or sores at the infusion site Pain, tingling, or numbness in the hands or feet Slow heartbeat--dizziness, feeling faint or lightheaded, confusion, trouble breathing, unusual weakness or fatigue Unusual bruising or bleeding Side effects that usually do not require medical attention (report to your care team if they continue or are bothersome): Diarrhea Hair loss Joint pain Loss of appetite Muscle pain Nausea Vomiting This list may not describe all possible side  effects. Call your doctor for medical advice about side effects. You may report side effects to FDA at 1-800-FDA-1088. Where should I keep my medication? This medication is given in a hospital or clinic. It will not be stored at home. NOTE: This sheet is a summary. It may not cover all possible information. If you have questions about this medicine, talk to your doctor, pharmacist, or health care provider.  2024 Elsevier/Gold Standard (2021-06-12 00:00:00)

## 2023-03-04 NOTE — Progress Notes (Signed)
Patient seen by Dr. Thornton Papas today  Vitals are within treatment parameters:Yes   Labs are within treatment parameters: Yes   Treatment plan has been signed: Yes   Per physician team, Patient is ready for treatment and there are NO modifications to the treatment plan.  Confirmed patient took home dexamethsone premeds

## 2023-03-05 ENCOUNTER — Telehealth: Payer: Self-pay

## 2023-03-05 ENCOUNTER — Other Ambulatory Visit: Payer: Self-pay

## 2023-03-05 LAB — T4: T4, Total: 8.9 ug/dL (ref 4.5–12.0)

## 2023-03-05 NOTE — Telephone Encounter (Signed)
24 Hour Call Back  Telephone call to patient post her first time Cyramza infusion yesterday. Patient denied any concerns or questions. She also denied any side effects and verbalized feeling great. She knows to call the clinic if this changes.

## 2023-03-06 ENCOUNTER — Emergency Department (HOSPITAL_BASED_OUTPATIENT_CLINIC_OR_DEPARTMENT_OTHER): Payer: Medicare Other

## 2023-03-06 ENCOUNTER — Telehealth: Payer: Self-pay

## 2023-03-06 ENCOUNTER — Emergency Department (HOSPITAL_BASED_OUTPATIENT_CLINIC_OR_DEPARTMENT_OTHER)
Admission: EM | Admit: 2023-03-06 | Discharge: 2023-03-06 | Disposition: A | Discharge status: Home / Self Care | Payer: Medicare Other | Attending: Emergency Medicine | Admitting: Emergency Medicine

## 2023-03-06 ENCOUNTER — Encounter (HOSPITAL_BASED_OUTPATIENT_CLINIC_OR_DEPARTMENT_OTHER): Payer: Self-pay | Admitting: Emergency Medicine

## 2023-03-06 ENCOUNTER — Other Ambulatory Visit: Payer: Self-pay

## 2023-03-06 ENCOUNTER — Other Ambulatory Visit (HOSPITAL_BASED_OUTPATIENT_CLINIC_OR_DEPARTMENT_OTHER): Payer: Self-pay

## 2023-03-06 DIAGNOSIS — Z8501 Personal history of malignant neoplasm of esophagus: Secondary | ICD-10-CM | POA: Diagnosis not present

## 2023-03-06 DIAGNOSIS — S0990XA Unspecified injury of head, initial encounter: Secondary | ICD-10-CM | POA: Insufficient documentation

## 2023-03-06 DIAGNOSIS — E039 Hypothyroidism, unspecified: Secondary | ICD-10-CM | POA: Insufficient documentation

## 2023-03-06 DIAGNOSIS — W1839XA Other fall on same level, initial encounter: Secondary | ICD-10-CM | POA: Insufficient documentation

## 2023-03-06 DIAGNOSIS — R55 Syncope and collapse: Secondary | ICD-10-CM | POA: Diagnosis present

## 2023-03-06 DIAGNOSIS — I10 Essential (primary) hypertension: Secondary | ICD-10-CM | POA: Diagnosis not present

## 2023-03-06 DIAGNOSIS — Y9301 Activity, walking, marching and hiking: Secondary | ICD-10-CM | POA: Diagnosis not present

## 2023-03-06 LAB — URINALYSIS, ROUTINE W REFLEX MICROSCOPIC
Bilirubin Urine: NEGATIVE
Glucose, UA: NEGATIVE mg/dL
Ketones, ur: NEGATIVE mg/dL
Nitrite: NEGATIVE
Protein, ur: NEGATIVE mg/dL
Specific Gravity, Urine: 1.005 (ref 1.005–1.030)
pH: 6 (ref 5.0–8.0)

## 2023-03-06 LAB — CBC
HCT: 40.5 % (ref 36.0–46.0)
Hemoglobin: 14.4 g/dL (ref 12.0–15.0)
MCH: 33.8 pg (ref 26.0–34.0)
MCHC: 35.6 g/dL (ref 30.0–36.0)
MCV: 95.1 fL (ref 80.0–100.0)
Platelets: 144 10*3/uL — ABNORMAL LOW (ref 150–400)
RBC: 4.26 MIL/uL (ref 3.87–5.11)
RDW: 13.8 % (ref 11.5–15.5)
WBC: 7.3 10*3/uL (ref 4.0–10.5)
nRBC: 0 % (ref 0.0–0.2)

## 2023-03-06 LAB — BASIC METABOLIC PANEL
Anion gap: 8 (ref 5–15)
BUN: 13 mg/dL (ref 8–23)
CO2: 27 mmol/L (ref 22–32)
Calcium: 9.6 mg/dL (ref 8.9–10.3)
Chloride: 100 mmol/L (ref 98–111)
Creatinine, Ser: 0.79 mg/dL (ref 0.44–1.00)
GFR, Estimated: >60 mL/min (ref >60)
Glucose, Bld: 132 mg/dL — ABNORMAL HIGH (ref 70–99)
Potassium: 3.4 mmol/L — ABNORMAL LOW (ref 3.5–5.1)
Sodium: 135 mmol/L (ref 135–145)

## 2023-03-06 LAB — MAGNESIUM: Magnesium: 1.9 mg/dL (ref 1.7–2.4)

## 2023-03-06 LAB — TROPONIN I (HIGH SENSITIVITY): Troponin I (High Sensitivity): 6 ng/L (ref <18)

## 2023-03-06 MED ORDER — SODIUM CHLORIDE 0.9 % IV BOLUS
500.0000 mL | Freq: Once | INTRAVENOUS | Status: AC
  Administered 2023-03-06: 500 mL via INTRAVENOUS

## 2023-03-06 MED ORDER — HEPARIN SOD (PORK) LOCK FLUSH 100 UNIT/ML IV SOLN
500.0000 [IU] | Freq: Once | INTRAVENOUS | Status: AC
  Administered 2023-03-06: 500 [IU]
  Filled 2023-03-06: qty 5

## 2023-03-06 MED ORDER — AMLODIPINE BESYLATE 5 MG PO TABS
5.0000 mg | ORAL_TABLET | Freq: Once | ORAL | Status: AC
  Administered 2023-03-06: 5 mg via ORAL
  Filled 2023-03-06: qty 1

## 2023-03-06 MED ORDER — AMLODIPINE BESYLATE 5 MG PO TABS
5.0000 mg | ORAL_TABLET | Freq: Every day | ORAL | 2 refills | Status: DC
Start: 2023-03-06 — End: 2023-04-17
  Filled 2023-03-06: qty 30, 30d supply, fill #0

## 2023-03-06 NOTE — Telephone Encounter (Signed)
The patient reported that she lost consciousness and fell, striking her head against a door. She is currently en route to [facility] for evaluation.

## 2023-03-06 NOTE — Discharge Instructions (Signed)
You were seen for passing out and your blood pressure in the emergency department.   At home, please take the amlodipine for your blood pressure. STOP taking this medication if your blood pressure is less than 100 systolic (the top number).    Check your MyChart online for the results of any tests that had not resulted by the time you left the emergency department.   Follow-up with your primary doctor in 2-3 days regarding your visit.    Return immediately to the emergency department if you experience any of the following: fainting, chest pain, shortness of breath, or any other concerning symptoms.    Thank you for visiting our Emergency Department. It was a pleasure taking care of you today.

## 2023-03-06 NOTE — ED Triage Notes (Signed)
Pt reports unwitnessed syncope today. Denies n/v or thinners, reports hitting posterior head. Pt current cancer pt receiving chemo, last tx x 2 days pta. GCS 15. PT aox4, denies dizziness

## 2023-03-06 NOTE — ED Provider Notes (Signed)
[place] EMERGENCY DEPARTMENT AT [facility] Provider Note   CSN: [id number] Arrival date & time: 03/06/23  1344     History  Chief Complaint  Patient presents with   Loss of Consciousness    Alison Garner is a 73 y.o. female.  73 year old female with a history of distal esophageal carcinoma currently undergoing treatment, hypothyroidism, and irritable bowel syndrome who presents emergency department with syncope.  Patient had a new treatment (paclitaxel/ramucirumab) on Tuesday of this week.  One of the side effects is low blood pressure.  Says she was at home this morning and had a bout of multiple stools.  When she was walking around afterwards lost consciousness and reports that she woke up on the ground.  Did hit her head on the door as she noticed a dent on the door and has a hematoma to her forehead.  Not on blood thinners but has been thrombocytopenic before.  No chest pain, shortness of breath, or palpitations.  No history of syncope.  No cardiac or pulmonary history otherwise.       Home Medications Prior to Admission medications   Medication Sig Start Date End Date Taking? Authorizing Provider  amLODipine (NORVASC) 5 MG tablet Take 1 tablet (5 mg total) by mouth daily. 03/06/23  Yes Rondel Baton, MD  Cholecalciferol (VITAMIN D3) 50 MCG (2000 UT) CAPS Take 2,000 Units by mouth 2 (two) times daily.    [provider]  dexamethasone (DECADRON) 2 MG tablet Take 5 tablets (10 mg total) by mouth as directed. Take 10 mg (#5 tablets) at 10 pm night before 1st chemotherapy and 10 mg (#5 tablets) at 6 am day of  1st chemotherapy 02/24/23   Rana Snare, NP  ezetimibe-simvastatin (VYTORIN) 10-20 MG tablet Take 1 tablet by mouth every other day.    [provider]  imipramine (TOFRANIL) 25 MG tablet Take 25 mg by mouth at bedtime. Takes 2 tablets in bedtime    [provider]  levothyroxine (SYNTHROID, LEVOTHROID) 25 MCG tablet Take 0.025  mcg by mouth daily before breakfast. 08/02/17   [provider]  lidocaine-prilocaine (EMLA) cream Apply 1 Application topically as needed (apply to [facility] site 1-2 hours before appt). Patient not taking: Reported on 03/04/2023 09/04/22   Rana Snare, NP  ondansetron (ZOFRAN) 8 MG tablet Take 1 tablet (8 mg total) by mouth every 8 (eight) hours as needed for nausea or vomiting (May use day 3 after chemo as needed for nausea). Patient not taking: Reported on 06/18/2022 05/23/22   Ladene Artist, MD  pantoprazole (PROTONIX) 40 MG tablet Take 1 tablet (40 mg total) by mouth 2 (two) times daily before a meal. 06/03/22   Pyrtle, Carie Caddy, MD  potassium chloride SA (KLOR-CON M) 20 MEQ tablet Take 1 tablet (20 mEq total) by mouth daily. 12/11/22   Ladene Artist, MD  prochlorperazine (COMPAZINE) 10 MG tablet Take 1 tablet (10 mg total) by mouth every 6 (six) hours as needed for nausea or vomiting. Patient not taking: Reported on 12/11/2022 05/23/22   Ladene Artist, MD  potassium chloride SA (KLOR-CON M) 20 MEQ tablet Take 1 tablet (20 mEq total) by mouth daily. 06/04/22   Rana Snare, NP      Allergies    Bactrim [sulfamethoxazole-trimethoprim]    Review of Systems   Review of Systems  Physical Exam Updated Vital Signs BP (!) 210/90   Pulse 81   Temp 98 F (36.7 C) (Oral)  Resp 15   Wt 61.7 kg   SpO2 99%   BMI 22.63 kg/m  Physical Exam Vitals and nursing note reviewed.  Constitutional:      General: She is not in acute distress.    Appearance: Normal appearance. She is well-developed. She is not ill-appearing.  HENT:     Head: Normocephalic and atraumatic.     Right Ear: External ear normal.     Left Ear: External ear normal.     Nose: Nose normal.     Mouth/Throat:     Mouth: Mucous membranes are moist.     Pharynx: Oropharynx is clear.  Eyes:     Extraocular Movements: Extraocular movements intact.     Conjunctiva/sclera: Conjunctivae normal.     Pupils: Pupils are  equal, round, and reactive to light.  Neck:     Comments: No C-spine midline tenderness to palpation Cardiovascular:     Rate and Rhythm: Normal rate and regular rhythm.     Pulses: Normal pulses.     Heart sounds: Normal heart sounds. No murmur heard. Pulmonary:     Effort: Pulmonary effort is normal. No respiratory distress.     Breath sounds: Normal breath sounds.  Musculoskeletal:        General: No deformity. Normal range of motion.     Cervical back: Normal range of motion and neck supple. No rigidity or tenderness.     Right lower leg: No edema.     Left lower leg: No edema.     Comments: No tenderness to palpation of midline thoracic or lumbar spine.  No step-offs palpated.  No tenderness to palpation of chest wall.  No bruising noted.  No tenderness to palpation of bilateral clavicles.  No tenderness to palpation, bruising, or deformities noted of bilateral shoulders, elbows, wrists, hips, knees, or ankles.  Skin:    General: Skin is warm and dry.  Neurological:     General: No focal deficit present.     Mental Status: She is alert and oriented to person, place, and time. Mental status is at baseline.     Cranial Nerves: No cranial nerve deficit.     Sensory: No sensory deficit.     Motor: No weakness.  Psychiatric:        Mood and Affect: Mood normal.     ED Results / Procedures / Treatments   Labs (all labs ordered are listed, but only abnormal results are displayed) Labs Reviewed  BASIC METABOLIC PANEL - Abnormal; Notable for the following components:      Result Value   Potassium 3.4 (*)    Glucose, Bld 132 (*)    All other components within normal limits  CBC - Abnormal; Notable for the following components:   Platelets 144 (*)    All other components within normal limits  URINALYSIS, ROUTINE W REFLEX MICROSCOPIC - Abnormal; Notable for the following components:   Hgb urine dipstick SMALL (*)    Leukocytes,Ua MODERATE (*)    Bacteria, UA FEW (*)    Non  Squamous Epithelial 0-5 (*)    All other components within normal limits  MAGNESIUM  TROPONIN I (HIGH SENSITIVITY)    EKG EKG Interpretation Date/Time:  Thursday March 06 2023 14:29:03 EST Ventricular Rate:  95 PR Interval:  164 QRS Duration:  84 QT Interval:  358 QTC Calculation: 449 R Axis:   -37  Text Interpretation: Normal sinus rhythm Possible Left atrial enlargement Left axis deviation Possible Anterior infarct , age undetermined  Abnormal ECG Confirmed by Vonita Moss [phone]) on 03/06/2023 2:57:35 PM  Radiology CT Head Wo Contrast Result Date: 03/06/2023 CLINICAL DATA:  fall head trauma EXAM: CT HEAD WITHOUT CONTRAST TECHNIQUE: Contiguous axial images were obtained from the base of the skull through the vertex without intravenous contrast. RADIATION DOSE REDUCTION: This exam was performed according to the departmental dose-optimization program which includes automated exposure control, adjustment of the mA and/or kV according to patient size and/or use of iterative reconstruction technique. COMPARISON:  None Available. FINDINGS: Brain: No intracranial hemorrhage, mass effect, or midline shift. Brain volume is normal for age. No hydrocephalus. The basilar cisterns are patent. Mild periventricular and deep chronic small vessel ischemic change. No evidence of territorial infarct or acute ischemia. No extra-axial or intracranial fluid collection. Vascular: Atherosclerosis of skullbase vasculature without hyperdense vessel or abnormal calcification. Skull: No fracture or focal lesion. Sinuses/Orbits: No acute findings. Other: None. IMPRESSION: 1. No acute intracranial abnormality. No skull fracture. 2. Mild chronic small vessel ischemic change. Electronically Signed   By: Narda Rutherford M.D.   On: 03/06/2023 17:04   DG Chest Portable 1 View Result Date: 03/06/2023 CLINICAL DATA:  Unwitnessed syncope EXAM: PORTABLE CHEST 1 VIEW COMPARISON:  None Available. FINDINGS: Right chest wall  port tip projects over the superior cavoatrial junction. Normal lung volumes. No focal consolidations. No pleural effusion or pneumothorax. The heart size and mediastinal contours are within normal limits. Subtle lucency along the left lateral third rib. IMPRESSION: 1. No acute disease. 2. Subtle lucency along the left lateral third rib, which may represent a nondisplaced fracture or secondary to artifact. Recommend correlation with point tenderness. Electronically Signed   By: Agustin Cree M.D.   On: 03/06/2023 17:00    Procedures Procedures    Medications Ordered in ED Medications  sodium chloride 0.9 % bolus 500 mL (0 mLs Intravenous Stopped 03/06/23 1738)  amLODipine (NORVASC) tablet 5 mg (5 mg Oral Given 03/06/23 1703)  heparin lock flush 100 unit/mL (500 Units Intracatheter Given 03/06/23 1744)    ED Course/ Medical Decision Making/ A&P                                 Medical Decision Making Amount and/or Complexity of Data Reviewed Labs: ordered. Radiology: ordered.  Risk Prescription drug management.    Alison Garner is a 73 y.o. female with comorbidities that complicate the patient evaluation including distal esophageal carcinoma currently undergoing treatment, hypothyroidism, and irritable bowel syndrome who presents emergency department with syncope.    Initial Ddx:  Chemo side effect, orthostasis, dehydration, TBI, concussion, C-spine injury, arrhythmia, syncope  MDM/Course:  Patient presents emergency department with head injury after syncopal event.  Does not appear to be confused or concussed currently.  Has a nonfocal neuroexam.  Has been thrombocytopenic in the past so a CT of the head was obtained that did not show acute abnormality.  Platelets today are 144.  Lab work otherwise unremarkable.  EKG without Brugada, long QT, or WPW.  She was notably hypertensive in the emergency department but not complaining of any other concerning symptoms for hypertensive emergency.   Was given amlodipine for her blood pressure which will be prescribed to her as an outpatient.  Upon re-evaluation patient was at baseline after receiving some IV fluids.  No more dizziness.  Suspect this may be a side effect of the chemotherapy or she could have been dehydrated.  Due to her  severely elevated blood pressures we will go ahead and start her on amlodipine and have her follow-up with her primary doctor in several days to see if the dose needs to be titrated at all.    This patient presents to the ED for concern of complaints listed in HPI, this involves an extensive number of treatment options, and is a complaint that carries with it a high risk of complications and morbidity. Disposition including potential need for admission considered.   Dispo: DC Home. Return precautions discussed including, but not limited to, those listed in the AVS. Allowed pt time to ask questions which were answered fully prior to dc.  Records reviewed Outpatient Clinic Notes The following labs were independently interpreted: Chemistry and show no acute abnormality I independently reviewed the following imaging with scope of interpretation limited to determining acute life threatening conditions related to emergency care: CT Head and agree with the radiologist interpretation with the following exceptions: none I personally reviewed and interpreted cardiac monitoring: normal sinus rhythm  I personally reviewed and interpreted the pt's EKG: see above for interpretation  I have reviewed the patients home medications and made adjustments as needed Social Determinants of health:  Elderly  Portions of this note were generated with Scientist, clinical (histocompatibility and immunogenetics). Dictation errors may occur despite best attempts at proofreading.     Final Clinical Impression(s) / ED Diagnoses Final diagnoses:  Uncontrolled hypertension  Syncope and collapse  Minor head injury, initial encounter    Rx / DC Orders ED Discharge  Orders          Ordered    amLODipine (NORVASC) 5 MG tablet  Daily        03/06/23 1655              Rondel Baton, MD 03/07/23 [phone]

## 2023-03-07 ENCOUNTER — Encounter: Payer: Self-pay | Admitting: *Deleted

## 2023-03-07 ENCOUNTER — Encounter: Payer: Self-pay | Admitting: Oncology

## 2023-03-11 ENCOUNTER — Other Ambulatory Visit: Payer: Self-pay

## 2023-03-15 ENCOUNTER — Other Ambulatory Visit: Payer: Self-pay | Admitting: Oncology

## 2023-03-18 ENCOUNTER — Inpatient Hospital Stay (HOSPITAL_BASED_OUTPATIENT_CLINIC_OR_DEPARTMENT_OTHER): Payer: Medicare Other | Admitting: Nurse Practitioner

## 2023-03-18 ENCOUNTER — Encounter: Payer: Self-pay | Admitting: Nurse Practitioner

## 2023-03-18 ENCOUNTER — Inpatient Hospital Stay: Payer: Medicare Other

## 2023-03-18 ENCOUNTER — Inpatient Hospital Stay: Payer: Medicare Other | Attending: Oncology

## 2023-03-18 VITALS — BP 117/62 | HR 88 | Temp 98.1°F | Resp 18 | Ht 65.0 in | Wt 138.1 lb

## 2023-03-18 DIAGNOSIS — K580 Irritable bowel syndrome with diarrhea: Secondary | ICD-10-CM | POA: Insufficient documentation

## 2023-03-18 DIAGNOSIS — R600 Localized edema: Secondary | ICD-10-CM | POA: Diagnosis not present

## 2023-03-18 DIAGNOSIS — J90 Pleural effusion, not elsewhere classified: Secondary | ICD-10-CM | POA: Diagnosis not present

## 2023-03-18 DIAGNOSIS — M25472 Effusion, left ankle: Secondary | ICD-10-CM | POA: Diagnosis not present

## 2023-03-18 DIAGNOSIS — G620 Drug-induced polyneuropathy: Secondary | ICD-10-CM | POA: Insufficient documentation

## 2023-03-18 DIAGNOSIS — R0689 Other abnormalities of breathing: Secondary | ICD-10-CM | POA: Insufficient documentation

## 2023-03-18 DIAGNOSIS — J449 Chronic obstructive pulmonary disease, unspecified: Secondary | ICD-10-CM

## 2023-03-18 DIAGNOSIS — E039 Hypothyroidism, unspecified: Secondary | ICD-10-CM

## 2023-03-18 DIAGNOSIS — K649 Unspecified hemorrhoids: Secondary | ICD-10-CM

## 2023-03-18 DIAGNOSIS — C155 Malignant neoplasm of lower third of esophagus: Secondary | ICD-10-CM | POA: Diagnosis not present

## 2023-03-18 DIAGNOSIS — D6959 Other secondary thrombocytopenia: Secondary | ICD-10-CM | POA: Insufficient documentation

## 2023-03-18 DIAGNOSIS — M25471 Effusion, right ankle: Secondary | ICD-10-CM | POA: Insufficient documentation

## 2023-03-18 DIAGNOSIS — L271 Localized skin eruption due to drugs and medicaments taken internally: Secondary | ICD-10-CM

## 2023-03-18 DIAGNOSIS — R55 Syncope and collapse: Secondary | ICD-10-CM | POA: Insufficient documentation

## 2023-03-18 DIAGNOSIS — E785 Hyperlipidemia, unspecified: Secondary | ICD-10-CM

## 2023-03-18 DIAGNOSIS — R7989 Other specified abnormal findings of blood chemistry: Secondary | ICD-10-CM | POA: Diagnosis not present

## 2023-03-18 DIAGNOSIS — Z5111 Encounter for antineoplastic chemotherapy: Secondary | ICD-10-CM | POA: Diagnosis present

## 2023-03-18 DIAGNOSIS — R059 Cough, unspecified: Secondary | ICD-10-CM | POA: Diagnosis not present

## 2023-03-18 DIAGNOSIS — Z95828 Presence of other vascular implants and grafts: Secondary | ICD-10-CM

## 2023-03-18 DIAGNOSIS — D709 Neutropenia, unspecified: Secondary | ICD-10-CM

## 2023-03-18 DIAGNOSIS — K589 Irritable bowel syndrome without diarrhea: Secondary | ICD-10-CM

## 2023-03-18 LAB — CMP (CANCER CENTER ONLY)
ALT: 15 U/L (ref 0–44)
AST: 17 U/L (ref 15–41)
Albumin: 3.6 g/dL (ref 3.5–5.0)
Alkaline Phosphatase: 78 U/L (ref 38–126)
Anion gap: 5 (ref 5–15)
BUN: 8 mg/dL (ref 8–23)
CO2: 27 mmol/L (ref 22–32)
Calcium: 9.3 mg/dL (ref 8.9–10.3)
Chloride: 100 mmol/L (ref 98–111)
Creatinine: 0.89 mg/dL (ref 0.44–1.00)
GFR, Estimated: >60 mL/min (ref >60)
Glucose, Bld: 88 mg/dL (ref 70–99)
Potassium: 4 mmol/L (ref 3.5–5.1)
Sodium: 132 mmol/L — ABNORMAL LOW (ref 135–145)
Total Bilirubin: 0.5 mg/dL (ref 0.0–1.2)
Total Protein: 6.3 g/dL — ABNORMAL LOW (ref 6.5–8.1)

## 2023-03-18 LAB — CBC WITH DIFFERENTIAL (CANCER CENTER ONLY)
Abs Immature Granulocytes: 0.01 10*3/uL (ref 0.00–0.07)
Basophils Absolute: 0 10*3/uL (ref 0.0–0.1)
Basophils Relative: 1 %
Eosinophils Absolute: 0.1 10*3/uL (ref 0.0–0.5)
Eosinophils Relative: 3 %
HCT: 36.3 % (ref 36.0–46.0)
Hemoglobin: 12.9 g/dL (ref 12.0–15.0)
Immature Granulocytes: 0 %
Lymphocytes Relative: 51 %
Lymphs Abs: 1.2 10*3/uL (ref 0.7–4.0)
MCH: 33.8 pg (ref 26.0–34.0)
MCHC: 35.5 g/dL (ref 30.0–36.0)
MCV: 95 fL (ref 80.0–100.0)
Monocytes Absolute: 0.4 10*3/uL (ref 0.1–1.0)
Monocytes Relative: 18 %
Neutro Abs: 0.6 10*3/uL — ABNORMAL LOW (ref 1.7–7.7)
Neutrophils Relative %: 27 %
Platelet Count: 183 10*3/uL (ref 150–400)
RBC: 3.82 MIL/uL — ABNORMAL LOW (ref 3.87–5.11)
RDW: 13.7 % (ref 11.5–15.5)
WBC Count: 2.3 10*3/uL — ABNORMAL LOW (ref 4.0–10.5)
nRBC: 0 % (ref 0.0–0.2)

## 2023-03-18 MED ORDER — SODIUM CHLORIDE 0.9% FLUSH
10.0000 mL | INTRAVENOUS | Status: AC | PRN
  Administered 2023-03-18: 10 mL via INTRAVENOUS

## 2023-03-18 MED ORDER — HEPARIN SOD (PORK) LOCK FLUSH 100 UNIT/ML IV SOLN
500.0000 [IU] | Freq: Once | INTRAVENOUS | Status: AC
  Administered 2023-03-18: 500 [IU] via INTRAVENOUS

## 2023-03-18 NOTE — Progress Notes (Signed)
[facility] OFFICE PROGRESS NOTE   Diagnosis: Esophagus cancer  INTERVAL HISTORY:   Alison Garner returns as scheduled.  She completed a cycle of Taxol/ramucirumab 03/04/2023.  She was evaluated in the emergency department 03/06/2023 with loss of consciousness.  She reports the loss of consciousness occurred after having 6-7 loose stools, typical of an "irritable bowel attack".  She became sweaty and felt shaky.  She then had the syncopal episode.  She has had no further similar occurrences.  No nausea or vomiting following chemotherapy.  No mouth sores.  She notes a cough at nighttime.  No fever.  No change in baseline dyspnea on exertion.  Objective:  Vital signs in last 24 hours:  Blood pressure 117/62, pulse 88, temperature 98.1 F (36.7 C), temperature source Temporal, resp. rate 18, height 5\' 5"  (1.651 m), weight 138 lb 1.6 oz (62.6 kg), SpO2 100%.    HEENT: No thrush or ulcers.  Mucous membranes appear moist. Resp: Lungs clear bilaterally. Cardio: Regular rate and rhythm. GI: Abdomen soft and nontender.  No hepatosplenomegaly. Vascular: No leg edema. Neuro: Alert and oriented. Skin: Palms without erythema.  Approximate 2 cm indurated cyst right groin, no erythema nontender. Port-A-Cath without erythema.  Lab Results:  Lab Results  Component Value Date   WBC 2.3 (L) 03/18/2023   HGB 12.9 03/18/2023   HCT 36.3 03/18/2023   MCV 95.0 03/18/2023   PLT 183 03/18/2023   NEUTROABS 0.6 (L) 03/18/2023    Imaging:  No results found.  Medications: I have reviewed the patient's current medications.  Assessment/Plan: Distal esophagus cancer Esophagram 11/21/2020-moderate to severe esophageal dysmotility, narrowing of the distal esophagus just above the GE junction to 4 mm, 13 mm barium tablet stuck in the distal esophagus Upper endoscopy 12/13/2020-severe esophagitis with nodularity and stricturing at 36 cm-adenocarcinoma, HER2 negative, no loss of mismatch repair  protein expression, PD-L1 combined positive score less than 1% CT chest 12/14/2020-accentuated density at the distal esophageal lumen above a small type I hiatal hernia concerning for malignancy, nonpathologically enlarged AP window and right hilar nodes PET 12/21/2020-abnormal FDG uptake at the distal esophagus, no evidence of nodal or distant metastatic disease Radiation 01/08/2021-02/16/2021 Cycle 1 weekly Taxol/carboplatin 01/08/2021 Cycle 2 weekly Taxol/carboplatin 01/15/2021 Treatment held 01/22/2021 due to neutropenia Positive COVID test 02/02/2021 Taxol/carboplatin 02/15/2021 Endoscopy 04/30/2021-shallow ulceration at the distal esophagus, nodularity at the gastric cardia-biopsies negative for malignancy CTs 04/02/2022-small mediastinal and right hilar lymph nodes.  Esophagus is mildly patulous decreased from previous in the mid to upper aspect of the mediastinum.  More distally the wall thickening of the esophagus appears slightly decreased.  Small hiatal hernia.  The area of thickening does correspond to the abnormality on PET-CT.  Emphysema. Endoscopy 04/15/2022-Short segment salmon-colored mucosa 1 cm above the Z-line.  Scarring at the gastroesophageal junction/Z-line with nonobstructing narrowing.  Nodular mucosa in the gastric cardia immediately distal to the Z-line.  Pathology on GE junction nodule-invasive moderately differentiated adenocarcinoma, tumor arises within a background of high-grade dysplasia, mild chronic active gastritis with foveolar hyperplasia, negative for squamous epithelium and intestinal metaplasia.  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  Negative for HER2 by IHC, PD-L1 CPS 2% PET scan 04/25/2022-increased radiotracer uptake associated with the distal esophagus at the level of the GE junction.  No evidence of nodal metastasis or distant metastatic disease. Cycle 1 FOLFOX 06/04/2022 Cycle 2 FOLFOX 06/18/2022, oxaliplatin dose reduced secondary  thrombocytopenia Cycle 3 FOLFOX 06/24/2022, oxaliplatin dose reduced secondary thrombocytopenia, 5-FU dose reduced secondary to  hand/foot syndrome 07/16/2022 chemotherapy held due to thrombocytopenia, dehydration Cycle 4 FOLFOX 07/23/2022, oxaliplatin dose reduced due to thrombocytopenia Cycle 5 FOLFOX 08/05/2022 CT chest 08/13/2022-mild focal wall thickening at the distal esophagus, 8 mm gastropathic node-not FDG avid on PET, unchanged small mediastinal nodes no evidence of metastatic disease in the chest Upper endoscopy 08/14/2022-benign-appearing mild stenosis in the distal esophagus-microscopic focus of adenocarcinoma Cycle 6 FOLFOX 09/04/2022 Maintenance 5-fluorouracil via pump every 2 weeks beginning 10/30/2022 5-fluorouracil infusion 11/14/2022 5-FU  infusion 11/27/2022 5-FU infusion 12/11/2022 5-FU infusion 12/25/2022 5-FU infusion 01/08/2023 CT chest 01/16/2023-stable distal esophageal thickening, no evidence of metastatic disease Upper endoscopy 02/12/2023-localized mucosal changes characterized by nodularity and scarring in the distal esophagus (cardiac mucosa with mild chronic nonspecific carditis, negative for intestinal metaplasia or dysplasia).  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  She has completed 1 cycle of Taxol/ramucirumab.  2 days after treatment she developed diarrhea and had a syncopal episode.  She reports the diarrhea was typical of an "irritable bowel attack".  She has had no further similar occurrences.  CBC from today shows neutropenia.  Precautions reviewed.  She understands to contact the office with fever, chills, other signs of infection.  We are holding treatment today and rescheduling for 1 week.  Taxol will be dose reduced with cycle 2.  She agrees with this plan.  She will return for lab, follow-up and treatment in 1 week.  We are available to see her sooner if needed.    [name] ANP/GNP-BC   03/18/2023  9:18 AM

## 2023-03-18 NOTE — Progress Notes (Signed)
Patient seen by [name] NP today  Vitals are within treatment parameters:Yes   Labs are within treatment parameters: No (Please specify and give further instructions.) WBC 2.3 Neut 0.6  Treatment plan has been signed: Yes   Per physician team, Patient will not be receiving treatment today.

## 2023-03-18 NOTE — Addendum Note (Signed)
Addended by: Dimitri Ped on: 03/18/2023 09:55 AM   Modules accepted: Orders

## 2023-03-19 ENCOUNTER — Encounter: Payer: Self-pay | Admitting: *Deleted

## 2023-03-19 NOTE — Progress Notes (Signed)
Contacted pathology to add on CLDN 18.2 to accession number [id number]

## 2023-03-20 ENCOUNTER — Other Ambulatory Visit: Payer: Self-pay

## 2023-03-20 ENCOUNTER — Encounter: Payer: Self-pay | Admitting: *Deleted

## 2023-03-20 NOTE — Progress Notes (Signed)
PATIENT NAVIGATOR PROGRESS NOTE  Name: Alison Garner Date: 03/20/2023 MRN: [id number]  DOB: May 10, 1950   Reason for visit:  Molecular testing  Comments:  Requested Her2 by [facility] testing on accession number [id number]    Time spent counseling/coordinating care: 30-45 minutes

## 2023-03-24 ENCOUNTER — Other Ambulatory Visit: Payer: Self-pay | Admitting: Oncology

## 2023-03-25 ENCOUNTER — Inpatient Hospital Stay: Payer: Medicare Other

## 2023-03-25 ENCOUNTER — Inpatient Hospital Stay (HOSPITAL_BASED_OUTPATIENT_CLINIC_OR_DEPARTMENT_OTHER): Payer: Medicare Other | Admitting: Oncology

## 2023-03-25 VITALS — BP 137/72 | HR 76 | Temp 98.1°F | Resp 18 | Ht 65.0 in | Wt 141.0 lb

## 2023-03-25 DIAGNOSIS — C155 Malignant neoplasm of lower third of esophagus: Secondary | ICD-10-CM

## 2023-03-25 DIAGNOSIS — Z5111 Encounter for antineoplastic chemotherapy: Secondary | ICD-10-CM | POA: Diagnosis not present

## 2023-03-25 LAB — CBC WITH DIFFERENTIAL (CANCER CENTER ONLY)
Abs Immature Granulocytes: 0.01 10*3/uL (ref 0.00–0.07)
Basophils Absolute: 0 10*3/uL (ref 0.0–0.1)
Basophils Relative: 0 %
Eosinophils Absolute: 0 10*3/uL (ref 0.0–0.5)
Eosinophils Relative: 1 %
HCT: 36.5 % (ref 36.0–46.0)
Hemoglobin: 12.8 g/dL (ref 12.0–15.0)
Immature Granulocytes: 0 %
Lymphocytes Relative: 25 %
Lymphs Abs: 1.5 10*3/uL (ref 0.7–4.0)
MCH: 33 pg (ref 26.0–34.0)
MCHC: 35.1 g/dL (ref 30.0–36.0)
MCV: 94.1 fL (ref 80.0–100.0)
Monocytes Absolute: 0.7 10*3/uL (ref 0.1–1.0)
Monocytes Relative: 12 %
Neutro Abs: 3.6 10*3/uL (ref 1.7–7.7)
Neutrophils Relative %: 62 %
Platelet Count: 181 10*3/uL (ref 150–400)
RBC: 3.88 MIL/uL (ref 3.87–5.11)
RDW: 14 % (ref 11.5–15.5)
WBC Count: 5.9 10*3/uL (ref 4.0–10.5)
nRBC: 0 % (ref 0.0–0.2)

## 2023-03-25 LAB — CMP (CANCER CENTER ONLY)
ALT: 14 U/L (ref 0–44)
AST: 16 U/L (ref 15–41)
Albumin: 3.8 g/dL (ref 3.5–5.0)
Alkaline Phosphatase: 83 U/L (ref 38–126)
Anion gap: 6 (ref 5–15)
BUN: 11 mg/dL (ref 8–23)
CO2: 27 mmol/L (ref 22–32)
Calcium: 9.4 mg/dL (ref 8.9–10.3)
Chloride: 97 mmol/L — ABNORMAL LOW (ref 98–111)
Creatinine: 0.89 mg/dL (ref 0.44–1.00)
GFR, Estimated: >60 mL/min (ref >60)
Glucose, Bld: 112 mg/dL — ABNORMAL HIGH (ref 70–99)
Potassium: 4.2 mmol/L (ref 3.5–5.1)
Sodium: 130 mmol/L — ABNORMAL LOW (ref 135–145)
Total Bilirubin: 0.6 mg/dL (ref 0.0–1.2)
Total Protein: 6.6 g/dL (ref 6.5–8.1)

## 2023-03-25 MED ORDER — SODIUM CHLORIDE 0.9 % IV SOLN: INTRAVENOUS | Status: DC

## 2023-03-25 MED ORDER — DIPHENHYDRAMINE HCL 50 MG/ML IJ SOLN
25.0000 mg | Freq: Once | INTRAMUSCULAR | Status: AC
  Administered 2023-03-25: 25 mg via INTRAVENOUS
  Filled 2023-03-25: qty 1

## 2023-03-25 MED ORDER — DEXAMETHASONE SODIUM PHOSPHATE 10 MG/ML IJ SOLN
10.0000 mg | Freq: Once | INTRAMUSCULAR | Status: AC
  Administered 2023-03-25: 10 mg via INTRAVENOUS
  Filled 2023-03-25: qty 1

## 2023-03-25 MED ORDER — HEPARIN SOD (PORK) LOCK FLUSH 100 UNIT/ML IV SOLN
500.0000 [IU] | Freq: Once | INTRAVENOUS | Status: AC | PRN
Start: 2023-03-25 — End: 2023-03-25
  Administered 2023-03-25: 500 [IU]

## 2023-03-25 MED ORDER — SODIUM CHLORIDE 0.9 % IV SOLN
60.0000 mg/m2 | Freq: Once | INTRAVENOUS | Status: AC
  Administered 2023-03-25: 102 mg via INTRAVENOUS
  Filled 2023-03-25: qty 17

## 2023-03-25 MED ORDER — SODIUM CHLORIDE 0.9% FLUSH
10.0000 mL | INTRAVENOUS | Status: DC | PRN
  Administered 2023-03-25: 10 mL

## 2023-03-25 MED ORDER — FAMOTIDINE IN NACL 20-0.9 MG/50ML-% IV SOLN
20.0000 mg | Freq: Once | INTRAVENOUS | Status: AC
  Administered 2023-03-25: 20 mg via INTRAVENOUS
  Filled 2023-03-25: qty 50

## 2023-03-25 MED ORDER — SODIUM CHLORIDE 0.9 % IV SOLN
8.0000 mg/kg | Freq: Once | INTRAVENOUS | Status: AC
  Administered 2023-03-25: 500 mg via INTRAVENOUS
  Filled 2023-03-25: qty 50

## 2023-03-25 NOTE — Progress Notes (Signed)
 Patient seen by Dr. Thornton Papas today  Vitals are within treatment parameters:Yes   Labs are within treatment parameters: Yes   Treatment plan has been signed: Yes   Per physician team, Patient is ready for treatment and there are NO modifications to the treatment plan.

## 2023-03-25 NOTE — Progress Notes (Signed)
[facility] OFFICE PROGRESS NOTE   Diagnosis: Esophagus cancer  INTERVAL HISTORY:   Ms Lightsey returns as scheduled.  No dysphagia.  She has a cough in the evening.  No recurrent syncope.  She feels the neuropathy symptoms may be slightly worse.  Objective:  Vital signs in last 24 hours:  Blood pressure 137/72, pulse 76, temperature 98.1 F (36.7 C), temperature source Temporal, resp. rate 18, height 5\' 5"  (1.651 m), weight 141 lb (64 kg), SpO2 98%.    HEENT: No thrush or ulcers Resp: End inspiratory rhonchi at the bilateral low posterior chest, no respiratory distress Cardio: Regular rate and rhythm GI: No hepatosplenomegaly Vascular: No leg edema  Portacath/PICC-without erythema  Lab Results:  Lab Results  Component Value Date   WBC 5.9 03/25/2023   HGB 12.8 03/25/2023   HCT 36.5 03/25/2023   MCV 94.1 03/25/2023   PLT 181 03/25/2023   NEUTROABS 3.6 03/25/2023    CMP  Lab Results  Component Value Date   NA 130 (L) 03/25/2023   K 4.2 03/25/2023   CL 97 (L) 03/25/2023   CO2 27 03/25/2023   GLUCOSE 112 (H) 03/25/2023   BUN 11 03/25/2023   CREATININE 0.89 03/25/2023   CALCIUM 9.4 03/25/2023   PROT 6.6 03/25/2023   ALBUMIN 3.8 03/25/2023   AST 16 03/25/2023   ALT 14 03/25/2023   ALKPHOS 83 03/25/2023   BILITOT 0.6 03/25/2023   GFRNONAA >60 03/25/2023    Lab Results  Component Value Date   CEA 13.92 (H) 03/04/2023    No results found for: "INR", "LABPROT"  Imaging:  No results found.  Medications: I have reviewed the patient's current medications.   Assessment/Plan: Distal esophagus cancer Esophagram 11/21/2020-moderate to severe esophageal dysmotility, narrowing of the distal esophagus just above the GE junction to 4 mm, 13 mm barium tablet stuck in the distal esophagus Upper endoscopy 12/13/2020-severe esophagitis with nodularity and stricturing at 36 cm-adenocarcinoma, HER2 negative, no loss of mismatch repair protein expression,  PD-L1 combined positive score less than 1% CT chest 12/14/2020-accentuated density at the distal esophageal lumen above a small type I hiatal hernia concerning for malignancy, nonpathologically enlarged AP window and right hilar nodes PET 12/21/2020-abnormal FDG uptake at the distal esophagus, no evidence of nodal or distant metastatic disease Radiation 01/08/2021-02/16/2021 Cycle 1 weekly Taxol/carboplatin 01/08/2021 Cycle 2 weekly Taxol/carboplatin 01/15/2021 Treatment held 01/22/2021 due to neutropenia Positive COVID test 02/02/2021 Taxol/carboplatin 02/15/2021 Endoscopy 04/30/2021-shallow ulceration at the distal esophagus, nodularity at the gastric cardia-biopsies negative for malignancy CTs 04/02/2022-small mediastinal and right hilar lymph nodes.  Esophagus is mildly patulous decreased from previous in the mid to upper aspect of the mediastinum.  More distally the wall thickening of the esophagus appears slightly decreased.  Small hiatal hernia.  The area of thickening does correspond to the abnormality on PET-CT.  Emphysema. Endoscopy 04/15/2022-Short segment salmon-colored mucosa 1 cm above the Z-line.  Scarring at the gastroesophageal junction/Z-line with nonobstructing narrowing.  Nodular mucosa in the gastric cardia immediately distal to the Z-line.  Pathology on GE junction nodule-invasive moderately differentiated adenocarcinoma, tumor arises within a background of high-grade dysplasia, mild chronic active gastritis with foveolar hyperplasia, negative for squamous epithelium and intestinal metaplasia.  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  Negative for HER2 by IHC, PD-L1 CPS 2% PET scan 04/25/2022-increased radiotracer uptake associated with the distal esophagus at the level of the GE junction.  No evidence of nodal metastasis or distant metastatic disease. Cycle 1 FOLFOX 06/04/2022 Cycle 2  FOLFOX 06/18/2022, oxaliplatin dose reduced secondary thrombocytopenia Cycle 3  FOLFOX 06/24/2022, oxaliplatin dose reduced secondary thrombocytopenia, 5-FU dose reduced secondary to hand/foot syndrome 07/16/2022 chemotherapy held due to thrombocytopenia, dehydration Cycle 4 FOLFOX 07/23/2022, oxaliplatin dose reduced due to thrombocytopenia Cycle 5 FOLFOX 08/05/2022 CT chest 08/13/2022-mild focal wall thickening at the distal esophagus, 8 mm gastropathic node-not FDG avid on PET, unchanged small mediastinal nodes no evidence of metastatic disease in the chest Upper endoscopy 08/14/2022-benign-appearing mild stenosis in the distal esophagus-microscopic focus of adenocarcinoma Cycle 6 FOLFOX 09/04/2022 Maintenance 5-fluorouracil via pump every 2 weeks beginning 10/30/2022 5-fluorouracil infusion 11/14/2022 5-FU  infusion 11/27/2022 5-FU infusion 12/11/2022 5-FU infusion 12/25/2022 5-FU infusion 01/08/2023 CT chest 01/16/2023-stable distal esophageal thickening, no evidence of metastatic disease Upper endoscopy 02/12/2023-localized mucosal changes characterized by nodularity and scarring in the distal esophagus (cardiac mucosa with mild chronic nonspecific carditis, negative for intestinal metaplasia or dysplasia).  Localized mild mucosal changes characterized by nodularity in the cardia (invasive moderately differentiated adenocarcinoma arising in background of high-grade dysplasia); foundation 1-tumor mutation burden 10, HRD signature negative, microsatellite stable, ERBB2 amplification Cycle 1 Taxol/ramucirumab 03/04/2023 Cycle 2 held 03/18/2023 due to neutropenia Cycle 2 Taxol/ramucirumab 03/25/2023, Taxol dose reduced Solid dysphagia secondary #1, resolved COPD on chest CT 12/14/2020 Hemorrhoids Hyperlipidemia Irritable bowel syndrome Hypothyroidism Tobacco use Thrombocytopenia secondary to chemotherapy Hand/foot syndrome-5-FU dose reduced with cycle 3 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 Rieth appears stable.  The neutrophil count has recovered.  The plan is to proceed with cycle 2 Taxol/ramucirumab today.  The Taxol will be dose reduced.  The etiology of the syncope event 2 weeks ago was unclear.  I think it is unlikely this event was related to chemotherapy.  She will return for an office visit and chemotherapy in 2 weeks.  The HER2 returned amplified on the Foundation 1 testing on the January biopsy.  We we will request IHC testing.  [facility]ornton Papas, MD  03/25/2023  10:37 AM

## 2023-03-25 NOTE — Patient Instructions (Signed)
[facility] - A DEPT OF [facility]  Discharge Instructions: Thank you for choosing [facility] to provide your oncology and hematology care.   If you have a lab appointment with the Cancer Center, please go directly to the Cancer Center and check in at the registration area.   Wear comfortable clothing and clothing appropriate for easy access to any Portacath or PICC line.   We strive to give you quality time with your provider. You may need to reschedule your appointment if you arrive late (15 or more minutes).  Arriving late affects you and other patients whose appointments are after yours.  Also, if you miss three or more appointments without notifying the office, you may be dismissed from the clinic at the provider's discretion.      For prescription refill requests, have your pharmacy contact our office and allow 72 hours for refills to be completed.    Today you received the following chemotherapy and/or immunotherapy agents CYRAMZA/TAXOL      To help prevent nausea and vomiting after your treatment, we encourage you to take your nausea medication as directed.  BELOW ARE SYMPTOMS THAT SHOULD BE REPORTED IMMEDIATELY: *FEVER GREATER THAN 100.4 F (38 C) OR HIGHER *CHILLS OR SWEATING *NAUSEA AND VOMITING THAT IS NOT CONTROLLED WITH YOUR NAUSEA MEDICATION *UNUSUAL SHORTNESS OF BREATH *UNUSUAL BRUISING OR BLEEDING *URINARY PROBLEMS (pain or burning when urinating, or frequent urination) *BOWEL PROBLEMS (unusual diarrhea, constipation, pain near the anus) TENDERNESS IN MOUTH AND THROAT WITH OR WITHOUT PRESENCE OF ULCERS (sore throat, sores in mouth, or a toothache) UNUSUAL RASH, SWELLING OR PAIN  UNUSUAL VAGINAL DISCHARGE OR ITCHING   Items with * indicate a potential emergency and should be followed up as soon as possible or go to the Emergency Department if any problems should occur.  Please show the CHEMOTHERAPY ALERT CARD or  IMMUNOTHERAPY ALERT CARD at check-in to the Emergency Department and triage nurse.  Should you have questions after your visit or need to cancel or reschedule your appointment, please contact [facility]  Dept: [phone]  and follow the prompts.  Office hours are 8:00 a.m. to 4:30 p.m. Monday - Friday. Please note that voicemails left after 4:00 p.m. may not be returned until the following business day.  We are closed weekends and major holidays. You have access to a nurse at all times for urgent questions. Please call the main number to the clinic Dept: [phone] and follow the prompts.   For any non-urgent questions, you may also contact your provider using MyChart. We now offer e-Visits for anyone 85 and older to request care online for non-urgent symptoms. For details visit [email or web address].   Also download the MyChart app! Go to the app store, search "MyChart", open the app, select [facility], and log in with your MyChart username and password.  Ramucirumab Injection What is this medication? RAMUCIRUMAB (ra mue SIR ue mab) treats some types of cancer. It works by blocking a protein that causes cancer cells to grow and multiply. This helps to slow or stop the spread of cancer cells. It is a monoclonal antibody. This medicine may be used for other purposes; ask your health care provider or pharmacist if you have questions. COMMON BRAND NAME(S): Cyramza What should I tell my care team before I take this medication? They need to know if you have any of these conditions: Blood clots Having  or recent surgery Heart attack High blood pressure History of a tear in your stomach or intestines Liver disease Protein in your urine Stomach bleeding Stroke Thyroid disease An unusual or allergic reaction to ramucirumab, other medications, foods, dyes, or preservatives Pregnant or trying to get pregnant Breast-feeding How should I  use this medication? This medication is injected into a vein. It is given by your care team in a hospital or clinic setting. Talk to your care team about the use of this medication in children. Special care may be needed. Overdosage: If you think you have taken too much of this medicine contact a poison control center or emergency room at once. NOTE: This medicine is only for you. Do not share this medicine with others. What if I miss a dose? Keep appointments for follow-up doses. It is important not to miss your dose. Call your care team if you are unable to keep an appointment. What may interact with this medication? Interactions have not been studied. This list may not describe all possible interactions. Give your health care provider a list of all the medicines, herbs, non-prescription drugs, or dietary supplements you use. Also tell them if you smoke, drink alcohol, or use illegal drugs. Some items may interact with your medicine. What should I watch for while using this medication? Your condition will be monitored carefully while you are receiving this medication. You may need blood work while taking this medication. This medication may make you feel generally unwell. This is not uncommon as chemotherapy can affect health cells as well as cancer cells. Report any side effects. Continue your course of treatment even though you feel ill unless your care team tells you to stop. This medication may increase your risk to bruise or bleed. Call your care team if you notice any unusual bleeding. Before having surgery, talk to your care team to make sure it is ok. This medication can increase the risk of poor healing of your surgical site or wound. You will need to stop this medication for 28 days before surgery. After surgery, wait at least 2 weeks before restarting this medication. Make sure the surgical site or wound is healed enough before restarting this medication. Talk to your care team if  questions. Talk to your care team if you may be pregnant. Serious birth defects can occur if you take this medication during pregnancy and for 3 months after the last dose. You will need a negative pregnancy test before starting this medication. Contraception is recommended while taking this medication and for 3 months after the last dose. Your care team can help you find the option that works for you. Do not breastfeed while taking this medication and for 2 months after the last dose. This medication may cause infertility. Talk to your care team if you are concerned about your fertility. What side effects may I notice from receiving this medication? Side effects that you should report to your care team as soon as possible: Allergic reactions--skin rash, itching, hives, swelling of the face, lips, tongue, or throat Bleeding--bloody or black, tar-like stools, vomiting blood or brown material that looks like coffee grounds, red or dark brown urine, small red or purple spots on skin, unusual bruising or bleeding Dizziness, loss of balance or coordination, confusion or trouble speaking Heart attack--pain or tightness in the chest, shoulders, arms, or jaw, nausea, shortness of breath, cold or clammy skin, feeling faint or lightheaded Increase in blood pressure Infection--fever, chills, cough, sore throat, wounds that don't  heal, pain or trouble when passing urine, general feeling of discomfort or being unwell Infusion reactions--chest pain, shortness of breath or trouble breathing, feeling faint or lightheaded Kidney injury--decrease in the amount of urine, swelling of the ankles, hands, or feet Liver injury--right upper belly pain, loss of appetite, nausea, light-colored stool, dark yellow or brown urine, yellowing skin or eyes, unusual weakness or fatigue Low thyroid levels (hypothyroidism)--unusual weakness or fatigue, increased sensitivity to cold, constipation, hair loss, dry skin, weight gain,  feelings of depression Stomach pain that is severe, does not go away, or gets worse Stroke--sudden numbness or weakness of the face, arm, or leg, trouble speaking, confusion, trouble walking, loss of balance or coordination, dizziness, severe headache, change in vision Sudden and severe headache, confusion, change in vision, seizures, which may be signs of posterior reversible encephalopathy syndrome (PRES) Side effects that usually do not require medical attention (report to your care team if they continue or are bothersome): Diarrhea Fatigue Stomach pain Swelling of the ankles, hands, or feet This list may not describe all possible side effects. Call your doctor for medical advice about side effects. You may report side effects to FDA at 1-800-FDA-1088. Where should I keep my medication? This medication is given in a hospital or clinic. It will not be stored at home. NOTE: This sheet is a summary. It may not cover all possible information. If you have questions about this medicine, talk to your doctor, pharmacist, or health care provider.  2024 Elsevier/Gold Standard (2021-06-14 00:00:00)  Paclitaxel Injection What is this medication? PACLITAXEL (PAK li TAX el) treats some types of cancer. It works by slowing down the growth of cancer cells. This medicine may be used for other purposes; ask your health care provider or pharmacist if you have questions. COMMON BRAND NAME(S): Onxol, Taxol What should I tell my care team before I take this medication? They need to know if you have any of these conditions: Heart disease Liver disease Low white blood cell levels An unusual or allergic reaction to paclitaxel, other medications, foods, dyes, or preservatives If you or your partner are pregnant or trying to get pregnant Breast-feeding How should I use this medication? This medication is injected into a vein. It is given by your care team in a hospital or clinic setting. Talk to your care  team about the use of this medication in children. While it may be given to children for selected conditions, precautions do apply. Overdosage: If you think you have taken too much of this medicine contact a poison control center or emergency room at once. NOTE: This medicine is only for you. Do not share this medicine with others. What if I miss a dose? Keep appointments for follow-up doses. It is important not to miss your dose. Call your care team if you are unable to keep an appointment. What may interact with this medication? Do not take this medication with any of the following: Live virus vaccines Other medications may affect the way this medication works. Talk with your care team about all of the medications you take. They may suggest changes to your treatment plan to lower the risk of side effects and to make sure your medications work as intended. This list may not describe all possible interactions. Give your health care provider a list of all the medicines, herbs, non-prescription drugs, or dietary supplements you use. Also tell them if you smoke, drink alcohol, or use illegal drugs. Some items may interact with your medicine. What should  I watch for while using this medication? Your condition will be monitored carefully while you are receiving this medication. You may need blood work while taking this medication. This medication may make you feel generally unwell. This is not uncommon as chemotherapy can affect healthy cells as well as cancer cells. Report any side effects. Continue your course of treatment even though you feel ill unless your care team tells you to stop. This medication can cause serious allergic reactions. To reduce the risk, your care team may give you other medications to take before receiving this one. Be sure to follow the directions from your care team. This medication may increase your risk of getting an infection. Call your care team for advice if you get a fever,  chills, sore throat, or other symptoms of a cold or flu. Do not treat yourself. Try to avoid being around people who are sick. This medication may increase your risk to bruise or bleed. Call your care team if you notice any unusual bleeding. Be careful brushing or flossing your teeth or using a toothpick because you may get an infection or bleed more easily. If you have any dental work done, tell your dentist you are receiving this medication. Talk to your care team if you may be pregnant. Serious birth defects can occur if you take this medication during pregnancy. Talk to your care team before breastfeeding. Changes to your treatment plan may be needed. What side effects may I notice from receiving this medication? Side effects that you should report to your care team as soon as possible: Allergic reactions--skin rash, itching, hives, swelling of the face, lips, tongue, or throat Heart rhythm changes--fast or irregular heartbeat, dizziness, feeling faint or lightheaded, chest pain, trouble breathing Increase in blood pressure Infection--fever, chills, cough, sore throat, wounds that don't heal, pain or trouble when passing urine, general feeling of discomfort or being unwell Low blood pressure--dizziness, feeling faint or lightheaded, blurry vision Low red blood cell level--unusual weakness or fatigue, dizziness, headache, trouble breathing Painful swelling, warmth, or redness of the skin, blisters or sores at the infusion site Pain, tingling, or numbness in the hands or feet Slow heartbeat--dizziness, feeling faint or lightheaded, confusion, trouble breathing, unusual weakness or fatigue Unusual bruising or bleeding Side effects that usually do not require medical attention (report to your care team if they continue or are bothersome): Diarrhea Hair loss Joint pain Loss of appetite Muscle pain Nausea Vomiting This list may not describe all possible side effects. Call your doctor for  medical advice about side effects. You may report side effects to FDA at 1-800-FDA-1088. Where should I keep my medication? This medication is given in a hospital or clinic. It will not be stored at home. NOTE: This sheet is a summary. It may not cover all possible information. If you have questions about this medicine, talk to your doctor, pharmacist, or health care provider.  2024 Elsevier/Gold Standard (2021-06-12 00:00:00)

## 2023-03-28 ENCOUNTER — Telehealth: Payer: Self-pay

## 2023-03-28 ENCOUNTER — Encounter: Payer: Self-pay | Admitting: *Deleted

## 2023-03-28 NOTE — Progress Notes (Signed)
Received results for CLDN 18 testing from [facility] that tissue from [id number] was insufficient for testing  Called [facility] and requested that pathologist look at accession number  [phone] to see if that is sufficient for testing and send to [facility] for CLDN 18 testing

## 2023-03-28 NOTE — Telephone Encounter (Signed)
Patient called the after hour access nurse line and complaint of breathing shortness of breath or sounds breathless. Caller states she is needing to know if she can take Robitussin, she is experiencing a cough after her chemo treatment. With the cough she has been anxious with shortness of breath.  The patient with the on call provider and the instruction her to take the Robitussin for the cough. I contacted the patient states this cough has been going on for month and it seem to be get worsen.

## 2023-03-31 ENCOUNTER — Telehealth: Payer: Self-pay | Admitting: *Deleted

## 2023-03-31 ENCOUNTER — Encounter: Payer: Self-pay | Admitting: Oncology

## 2023-03-31 NOTE — Telephone Encounter (Signed)
Error

## 2023-03-31 NOTE — Telephone Encounter (Addendum)
 Called Alison Garner to f/u on MyChart message regarding cough and swelling in feet. Has tried OTC meds and not helping. Only SOB when she has a coughing spell. No fever or chest pain. Mild pitting edema in both feet, but she can't elevate, because reclining causes coughing. No pain with ambulation and swelling does not extend up her legs.  Per Dr. Truett Perna: Tussionex 5 cc every 12 hours prn; be sure she is taking antacid (? Reflux); elevate HOB; can do CXR next visit or this week if she wishes.  Spoke with patient again: she has had a few days that she has not smoked. Is taking her Protonix bid and is not reclining due to cough. She declines the CXR at this time. Informed her MD will send in script later today.

## 2023-04-01 ENCOUNTER — Other Ambulatory Visit: Payer: Self-pay | Admitting: Oncology

## 2023-04-01 ENCOUNTER — Other Ambulatory Visit: Payer: Medicare Other

## 2023-04-01 ENCOUNTER — Ambulatory Visit (HOSPITAL_BASED_OUTPATIENT_CLINIC_OR_DEPARTMENT_OTHER)
Admission: RE | Admit: 2023-04-01 | Discharge: 2023-04-01 | Disposition: A | Discharge status: Home / Self Care | Payer: Medicare Other | Source: Ambulatory Visit | Attending: Oncology | Admitting: Oncology

## 2023-04-01 ENCOUNTER — Ambulatory Visit: Payer: Medicare Other

## 2023-04-01 ENCOUNTER — Ambulatory Visit: Payer: Medicare Other | Admitting: Oncology

## 2023-04-01 ENCOUNTER — Telehealth: Payer: Self-pay | Admitting: *Deleted

## 2023-04-01 DIAGNOSIS — C155 Malignant neoplasm of lower third of esophagus: Secondary | ICD-10-CM

## 2023-04-01 MED ORDER — HYDROCOD POLI-CHLORPHE POLI ER 10-8 MG/5ML PO SUER: 5.0000 mL | Freq: Two times a day (BID) | ORAL | 0 refills | Status: DC | PRN

## 2023-04-01 NOTE — Telephone Encounter (Signed)
 Ms. Clermont called back and wants to have CXR today. Order placed.

## 2023-04-02 ENCOUNTER — Other Ambulatory Visit: Payer: Self-pay | Admitting: Oncology

## 2023-04-02 ENCOUNTER — Encounter: Payer: Self-pay | Admitting: Oncology

## 2023-04-03 ENCOUNTER — Encounter: Payer: Self-pay | Admitting: Nurse Practitioner

## 2023-04-03 ENCOUNTER — Inpatient Hospital Stay (HOSPITAL_BASED_OUTPATIENT_CLINIC_OR_DEPARTMENT_OTHER): Payer: Medicare Other | Admitting: Nurse Practitioner

## 2023-04-03 ENCOUNTER — Inpatient Hospital Stay: Payer: Medicare Other

## 2023-04-03 ENCOUNTER — Ambulatory Visit (HOSPITAL_BASED_OUTPATIENT_CLINIC_OR_DEPARTMENT_OTHER)
Admission: RE | Admit: 2023-04-03 | Discharge: 2023-04-03 | Disposition: A | Discharge status: Home / Self Care | Payer: Medicare Other | Source: Ambulatory Visit | Attending: Nurse Practitioner

## 2023-04-03 ENCOUNTER — Telehealth: Payer: Self-pay | Admitting: *Deleted

## 2023-04-03 ENCOUNTER — Other Ambulatory Visit: Payer: Self-pay | Admitting: Nurse Practitioner

## 2023-04-03 VITALS — BP 174/84 | HR 73 | Temp 97.5°F | Resp 18 | Wt 146.3 lb

## 2023-04-03 DIAGNOSIS — Z5111 Encounter for antineoplastic chemotherapy: Secondary | ICD-10-CM | POA: Diagnosis not present

## 2023-04-03 DIAGNOSIS — R059 Cough, unspecified: Secondary | ICD-10-CM

## 2023-04-03 DIAGNOSIS — R0689 Other abnormalities of breathing: Secondary | ICD-10-CM

## 2023-04-03 DIAGNOSIS — C155 Malignant neoplasm of lower third of esophagus: Secondary | ICD-10-CM

## 2023-04-03 DIAGNOSIS — R0609 Other forms of dyspnea: Secondary | ICD-10-CM

## 2023-04-03 DIAGNOSIS — Z95828 Presence of other vascular implants and grafts: Secondary | ICD-10-CM

## 2023-04-03 LAB — CBC WITH DIFFERENTIAL (CANCER CENTER ONLY)
Abs Immature Granulocytes: 0.02 10*3/uL (ref 0.00–0.07)
Basophils Absolute: 0 10*3/uL (ref 0.0–0.1)
Basophils Relative: 0 %
Eosinophils Absolute: 0.1 10*3/uL (ref 0.0–0.5)
Eosinophils Relative: 1 %
HCT: 31.3 % — ABNORMAL LOW (ref 36.0–46.0)
Hemoglobin: 11.4 g/dL — ABNORMAL LOW (ref 12.0–15.0)
Immature Granulocytes: 0 %
Lymphocytes Relative: 27 %
Lymphs Abs: 1.4 10*3/uL (ref 0.7–4.0)
MCH: 33.2 pg (ref 26.0–34.0)
MCHC: 36.4 g/dL — ABNORMAL HIGH (ref 30.0–36.0)
MCV: 91.3 fL (ref 80.0–100.0)
Monocytes Absolute: 0.6 10*3/uL (ref 0.1–1.0)
Monocytes Relative: 11 %
Neutro Abs: 3 10*3/uL (ref 1.7–7.7)
Neutrophils Relative %: 61 %
Platelet Count: 176 10*3/uL (ref 150–400)
RBC: 3.43 MIL/uL — ABNORMAL LOW (ref 3.87–5.11)
RDW: 13.6 % (ref 11.5–15.5)
WBC Count: 5.1 10*3/uL (ref 4.0–10.5)
nRBC: 0 % (ref 0.0–0.2)

## 2023-04-03 LAB — CMP (CANCER CENTER ONLY)
ALT: 10 U/L (ref 0–44)
AST: 13 U/L — ABNORMAL LOW (ref 15–41)
Albumin: 3.6 g/dL (ref 3.5–5.0)
Alkaline Phosphatase: 73 U/L (ref 38–126)
Anion gap: 6 (ref 5–15)
BUN: 10 mg/dL (ref 8–23)
CO2: 28 mmol/L (ref 22–32)
Calcium: 9.2 mg/dL (ref 8.9–10.3)
Chloride: 92 mmol/L — ABNORMAL LOW (ref 98–111)
Creatinine: 0.8 mg/dL (ref 0.44–1.00)
GFR, Estimated: >60 mL/min (ref >60)
Glucose, Bld: 104 mg/dL — ABNORMAL HIGH (ref 70–99)
Potassium: 3.8 mmol/L (ref 3.5–5.1)
Sodium: 126 mmol/L — ABNORMAL LOW (ref 135–145)
Total Bilirubin: 0.6 mg/dL (ref 0.0–1.2)
Total Protein: 6 g/dL — ABNORMAL LOW (ref 6.5–8.1)

## 2023-04-03 LAB — BRAIN NATRIURETIC PEPTIDE: B Natriuretic Peptide: 1105 pg/mL — ABNORMAL HIGH (ref 0.0–100.0)

## 2023-04-03 MED ORDER — HEPARIN SOD (PORK) LOCK FLUSH 100 UNIT/ML IV SOLN
500.0000 [IU] | Freq: Once | INTRAVENOUS | Status: AC
  Administered 2023-04-03: 500 [IU] via INTRAVENOUS

## 2023-04-03 MED ORDER — SODIUM CHLORIDE 0.9% FLUSH
10.0000 mL | INTRAVENOUS | Status: DC | PRN
Start: 2023-04-03 — End: 2023-04-03
  Administered 2023-04-03: 10 mL via INTRAVENOUS

## 2023-04-03 MED ORDER — FUROSEMIDE 40 MG PO TABS: 40.0000 mg | ORAL_TABLET | Freq: Every day | ORAL | 1 refills | Status: DC

## 2023-04-03 MED ORDER — IOHEXOL 350 MG/ML SOLN
100.0000 mL | Freq: Once | INTRAVENOUS | Status: AC | PRN
  Administered 2023-04-03: 75 mL via INTRAVENOUS

## 2023-04-03 NOTE — Patient Instructions (Signed)

## 2023-04-03 NOTE — Telephone Encounter (Signed)
 Daughter reports the Tussionex did not help her cough last night. Cough is progressive-not short of breath unless she has coughing spell. No fever or chest pain. Does have some edema in both feet now. Concerned about pleural effusions (small) on CXR and how this CXR is different from January CXR. Wants her seen asap. Per Dr. Truett Perna: Will have her seen today with symptom management NP and labs ordered. Scheduler will call daughter with time.

## 2023-04-03 NOTE — Progress Notes (Signed)
 Patient Care Team: Majel Homer, MD as PCP - General (Internal Medicine) Jerrell Mylar, Jacquelyne Balint, RN as Oncology Nurse Navigator Truett Perna, Leighton Roach, MD as Consulting Physician (Oncology)   CHIEF COMPLAINT: Follow-up progressive cough and fluid retention  CURRENT THERAPY: Taxol/Ramucirumab q2 weeks  INTERVAL HISTORY Ms. Racey presents for symptom management visit, here with her daughter.  Last seen 2/18 for cycle 1 day 15 Taxol/Cyramza.  She has progressive cough at night, not able to lay back and has to sleep leaning forward.  Cough medicine did not help.  She denies chest pain or shortness of breath most of the time but does feel short of breath with major coughing spells.  She has developed swelling in her ankles which is new, and her abdomen feels full.  She is hungry but not able to eat much due to the fullness but has gained 5 pounds in the past week.  ROS  All other systems reviewed and negative  Past Medical History:  Diagnosis Date   Arthritis    Carotid bruit    Diverticulosis    Esophageal cancer (HCC)    External hemorrhoids    Hiatal hernia    History of radiation therapy    Esophagus- 01/08/21-02/16/21- Dr. Antony Blackbird   Hyperlipidemia    Hypothyroidism    Internal hemorrhoids    Irritable bowel syndrome    Kidney infection    UTI hx   Kidney stones    Lichen planus    Macular cyst, hole, or pseudohole, unspecified eye    Thyroid disease    thyroid nodule   Vitamin D deficiency      Past Surgical History:  Procedure Laterality Date   COLONOSCOPY  2017   JMP-MAC-good prep-tics/hems/normal colon   DILATION AND CURETTAGE OF UTERUS  1982   IR IMAGING GUIDED PORT INSERTION  05/17/2022   UPPER GASTROINTESTINAL ENDOSCOPY  12/2020     Outpatient Encounter Medications as of 04/03/2023  Medication Sig Note   chlorpheniramine-HYDROcodone (TUSSIONEX) 10-8 MG/5ML Take 5 mLs by mouth every 12 (twelve) hours as needed for cough.    Cholecalciferol (VITAMIN D3) 50 MCG  (2000 UT) CAPS Take 2,000 Units by mouth 2 (two) times daily.    ezetimibe-simvastatin (VYTORIN) 10-20 MG tablet Take 1 tablet by mouth every other day.    imipramine (TOFRANIL) 25 MG tablet Take 25 mg by mouth at bedtime. Takes 2 tablets in bedtime    levothyroxine (SYNTHROID, LEVOTHROID) 25 MCG tablet Take 0.025 mcg by mouth daily before breakfast.    pantoprazole (PROTONIX) 40 MG tablet Take 1 tablet (40 mg total) by mouth 2 (two) times daily before a meal.    amLODipine (NORVASC) 5 MG tablet Take 1 tablet (5 mg total) by mouth daily. (Patient not taking: Reported on 03/18/2023)    dexamethasone (DECADRON) 2 MG tablet Take 5 tablets (10 mg total) by mouth as directed. Take 10 mg (#5 tablets) at 10 pm night before 1st chemotherapy and 10 mg (#5 tablets) at 6 am day of  1st chemotherapy (Patient not taking: Reported on 04/03/2023)    lidocaine-prilocaine (EMLA) cream Apply 1 Application topically as needed (apply to [facility] site 1-2 hours before appt). (Patient not taking: Reported on 04/03/2023)    ondansetron (ZOFRAN) 8 MG tablet Take 1 tablet (8 mg total) by mouth every 8 (eight) hours as needed for nausea or vomiting (May use day 3 after chemo as needed for nausea). (Patient not taking: Reported on 04/03/2023)    potassium chloride SA (  KLOR-CON M) 20 MEQ tablet Take 1 tablet (20 mEq total) by mouth daily. (Patient not taking: Reported on 04/03/2023) 01/22/2023: Averages 1/week   prochlorperazine (COMPAZINE) 10 MG tablet Take 1 tablet (10 mg total) by mouth every 6 (six) hours as needed for nausea or vomiting. (Patient not taking: Reported on 04/03/2023)    [DISCONTINUED] potassium chloride SA (KLOR-CON M) 20 MEQ tablet Take 1 tablet (20 mEq total) by mouth daily.    Facility-Administered Encounter Medications as of 04/03/2023  Medication   [COMPLETED] heparin lock flush 100 unit/mL   sodium chloride flush (NS) 0.9 % injection 10 mL   [DISCONTINUED] sodium chloride flush (NS) 0.9 % injection 10 mL      Today's Vitals   04/03/23 1140 04/03/23 1150 04/03/23 1217  BP: (!) 175/69  (!) 174/84  Pulse: 73    Resp: 18    Temp: (!) 97.5 F (36.4 C)    TempSrc: Temporal    SpO2: 99%    Weight: 146 lb 4.8 oz (66.4 kg)    PainSc:  0-No pain    Body mass index is 24.35 kg/m.   PHYSICAL EXAM GENERAL:alert, no distress and comfortable SKIN: no rash  EYES: sclera clear NECK: without mass LUNGS: mild rhonchi left lung, normal breathing effort HEART: regular rate & rhythm.  No JVD or venous engorgement.  Pitting bilateral lower extremity edema L>R up to the thigh ABDOMEN: abdomen soft, non-tender and normal bowel sounds NEURO: alert & oriented x 3 with fluent speech, no focal motor/sensory deficits PAC without erythema    CBC    Component Value Date/Time   WBC 5.1 04/03/2023 1135   WBC 7.3 03/06/2023 1429   RBC 3.43 (L) 04/03/2023 1135   HGB 11.4 (L) 04/03/2023 1135   HCT 31.3 (L) 04/03/2023 1135   PLT 176 04/03/2023 1135   MCV 91.3 04/03/2023 1135   MCH 33.2 04/03/2023 1135   MCHC 36.4 (H) 04/03/2023 1135   RDW 13.6 04/03/2023 1135   LYMPHSABS 1.4 04/03/2023 1135   MONOABS 0.6 04/03/2023 1135   EOSABS 0.1 04/03/2023 1135   BASOSABS 0.0 04/03/2023 1135     CMP     Component Value Date/Time   NA 126 (L) 04/03/2023 1135   K 3.8 04/03/2023 1135   CL 92 (L) 04/03/2023 1135   CO2 28 04/03/2023 1135   GLUCOSE 104 (H) 04/03/2023 1135   BUN 10 04/03/2023 1135   CREATININE 0.80 04/03/2023 1135   CALCIUM 9.2 04/03/2023 1135   PROT 6.0 (L) 04/03/2023 1135   ALBUMIN 3.6 04/03/2023 1135   AST 13 (L) 04/03/2023 1135   ALT 10 04/03/2023 1135   ALKPHOS 73 04/03/2023 1135   BILITOT 0.6 04/03/2023 1135   GFRNONAA >60 04/03/2023 1135     ASSESSMENT & PLAN:  Distal esophagus cancer Esophagram 11/21/2020-moderate to severe esophageal dysmotility, narrowing of the distal esophagus just above the GE junction to 4 mm, 13 mm barium tablet stuck in the distal esophagus Upper endoscopy  12/13/2020-severe esophagitis with nodularity and stricturing at 36 cm-adenocarcinoma, HER2 negative, no loss of mismatch repair protein expression, PD-L1 combined positive score less than 1% CT chest 12/14/2020-accentuated density at the distal esophageal lumen above a small type I hiatal hernia concerning for malignancy, nonpathologically enlarged AP window and right hilar nodes PET 12/21/2020-abnormal FDG uptake at the distal esophagus, no evidence of nodal or distant metastatic disease Radiation 01/08/2021-02/16/2021 Cycle 1 weekly Taxol/carboplatin 01/08/2021 Cycle 2 weekly Taxol/carboplatin 01/15/2021 Treatment held 01/22/2021 due to neutropenia Positive COVID  test 02/02/2021 Taxol/carboplatin 02/15/2021 Endoscopy 04/30/2021-shallow ulceration at the distal esophagus, nodularity at the gastric cardia-biopsies negative for malignancy CTs 04/02/2022-small mediastinal and right hilar lymph nodes.  Esophagus is mildly patulous decreased from previous in the mid to upper aspect of the mediastinum.  More distally the wall thickening of the esophagus appears slightly decreased.  Small hiatal hernia.  The area of thickening does correspond to the abnormality on PET-CT.  Emphysema. Endoscopy 04/15/2022-Short segment salmon-colored mucosa 1 cm above the Z-line.  Scarring at the gastroesophageal junction/Z-line with nonobstructing narrowing.  Nodular mucosa in the gastric cardia immediately distal to the Z-line.  Pathology on GE junction nodule-invasive moderately differentiated adenocarcinoma, tumor arises within a background of high-grade dysplasia, mild chronic active gastritis with foveolar hyperplasia, negative for squamous epithelium and intestinal metaplasia.  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  Negative for HER2 by IHC, PD-L1 CPS 2% PET scan 04/25/2022-increased radiotracer uptake associated with the distal esophagus at the level of the GE junction.  No evidence of nodal  metastasis or distant metastatic disease. Cycle 1 FOLFOX 06/04/2022 Cycle 2 FOLFOX 06/18/2022, oxaliplatin dose reduced secondary thrombocytopenia Cycle 3 FOLFOX 06/24/2022, oxaliplatin dose reduced secondary thrombocytopenia, 5-FU dose reduced secondary to hand/foot syndrome 07/16/2022 chemotherapy held due to thrombocytopenia, dehydration Cycle 4 FOLFOX 07/23/2022, oxaliplatin dose reduced due to thrombocytopenia Cycle 5 FOLFOX 08/05/2022 CT chest 08/13/2022-mild focal wall thickening at the distal esophagus, 8 mm gastropathic node-not FDG avid on PET, unchanged small mediastinal nodes no evidence of metastatic disease in the chest Upper endoscopy 08/14/2022-benign-appearing mild stenosis in the distal esophagus-microscopic focus of adenocarcinoma Cycle 6 FOLFOX 09/04/2022 Maintenance 5-fluorouracil via pump every 2 weeks beginning 10/30/2022 5-fluorouracil infusion 11/14/2022 5-FU  infusion 11/27/2022 5-FU infusion 12/11/2022 5-FU infusion 12/25/2022 5-FU infusion 01/08/2023 CT chest 01/16/2023-stable distal esophageal thickening, no evidence of metastatic disease Upper endoscopy 02/12/2023-localized mucosal changes characterized by nodularity and scarring in the distal esophagus (cardiac mucosa with mild chronic nonspecific carditis, negative for intestinal metaplasia or dysplasia).  Localized mild mucosal changes characterized by nodularity in the cardia (invasive moderately differentiated adenocarcinoma arising in background of high-grade dysplasia); foundation 1-tumor mutation burden 10, HRD signature negative, microsatellite stable, ERBB2 amplification Cycle 1 Taxol/ramucirumab 03/04/2023 Cycle 2 held 03/18/2023 due to neutropenia Cycle 2 Taxol/ramucirumab 03/25/2023, Taxol dose reduced Solid dysphagia secondary #1, resolved COPD on chest CT 12/14/2020 Hemorrhoids Hyperlipidemia Irritable bowel syndrome Hypothyroidism Tobacco use Thrombocytopenia secondary to chemotherapy Hand/foot syndrome-5-FU  dose reduced with cycle 3 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 Mackowiak appears stable for outpatient management. She presents with worsening nocturnal cough without significant orthopnea, L>R leg edema, and elevated BNP. She is not hypoxic. The clinical picture is concerning for acute CHF, pericardial effusion, PE, COPD exacerbation, or cancer-related change, or other. We reviewed the possibility of fluid retention from taxanes, but the cough would be unusual.   She is being referred for stat CTA and an echo to further guide management.   Pt seen with Dr. Truett Perna.   Orders Placed This Encounter  Procedures   CT Angio Chest Pulmonary Embolism (PE) W or WO Contrast    Standing Status:   Future    Number of Occurrences:   1    Expected Date:   04/03/2023    Expiration Date:   04/02/2024    If indicated for the ordered procedure, I authorize the administration of  contrast media per Radiology protocol:   Yes    Does the patient have a contrast media/X-ray dye allergy?:   No    Preferred imaging location?:   [facility]      All questions were answered. The patient knows to call the clinic with any problems, questions or concerns. No barriers to learning were detected.  Santiago Glad, NP-C 04/03/2023  Addendum 4:59 PM-she had a stat CT chest earlier today.  We have contacted the radiology department several times for a reading.  Dr. Truett Perna reviewed the images.  Per his review-no obvious PE noted; small bilateral pleural effusions; probable fissural edema; pericardium appears unchanged as compared to CT chest December 2024.  She appears stable.  She will begin Lasix 40 mg daily, continue home potassium.  We have ordered a 2D echo.  We will contact her once the chest CT report has been entered into the  computer.  Next scheduled office visit 04/08/2023.  [name], NP  Patient seen with Dr. Truett Perna.  This was a shared visit with Santiago Glad and [name].  Ms. Kwasny was interviewed and examined.  She presents with a cough that is worse at night, left greater than right lower extremity edema, and weight gain.  We discussed the differential diagnosis including a pulmonary embolism, progressive tumor involving the chest, a pericardial effusion, and CHF.  The BNP is markedly elevated.  She was referred for a stat chest CT.  Despite multiple calls to radiology over several hours we were unable to obtain a reading.  We reviewed the CT images and there is no apparent pneumonia, progressive cancer, or obvious pulmonary embolism.  We think it is unlikely her symptoms are related to the recent chemotherapy.  There are small bilateral pleural effusions.  We decided to begin a trial of furosemide and she has been referred for an echocardiogram.  She will contact us with an update on her symptoms tomorrow.  We will follow-up on the final CT report.  I was present for greater than 50% of today's visit.  I performed medical decision making.  [name], MD

## 2023-04-04 ENCOUNTER — Ambulatory Visit (HOSPITAL_COMMUNITY): Payer: Medicare Other | Attending: Cardiovascular Disease

## 2023-04-04 DIAGNOSIS — C155 Malignant neoplasm of lower third of esophagus: Secondary | ICD-10-CM | POA: Diagnosis present

## 2023-04-04 DIAGNOSIS — R0609 Other forms of dyspnea: Secondary | ICD-10-CM | POA: Diagnosis present

## 2023-04-04 DIAGNOSIS — R059 Cough, unspecified: Secondary | ICD-10-CM

## 2023-04-04 LAB — ECHOCARDIOGRAM COMPLETE
AR max vel: 1.6 cm2
AV Area VTI: 1.65 cm2
AV Area mean vel: 1.58 cm2
AV Mean grad: 3.2 mm[Hg]
AV Peak grad: 5.9 mm[Hg]
Ao pk vel: 1.22 m/s
Area-P 1/2: 3.36 cm2
S' Lateral: 3.1 cm

## 2023-04-05 ENCOUNTER — Other Ambulatory Visit: Payer: Self-pay

## 2023-04-06 ENCOUNTER — Other Ambulatory Visit: Payer: Self-pay | Admitting: Oncology

## 2023-04-08 ENCOUNTER — Inpatient Hospital Stay: Payer: Medicare Other

## 2023-04-08 ENCOUNTER — Inpatient Hospital Stay (HOSPITAL_BASED_OUTPATIENT_CLINIC_OR_DEPARTMENT_OTHER): Payer: Medicare Other | Admitting: Nurse Practitioner

## 2023-04-08 ENCOUNTER — Encounter: Payer: Self-pay | Admitting: Nurse Practitioner

## 2023-04-08 ENCOUNTER — Inpatient Hospital Stay: Payer: Medicare Other | Attending: Oncology

## 2023-04-08 VITALS — BP 119/58 | HR 84 | Temp 98.0°F | Resp 18 | Ht 65.0 in | Wt 135.4 lb

## 2023-04-08 DIAGNOSIS — Z95828 Presence of other vascular implants and grafts: Secondary | ICD-10-CM

## 2023-04-08 DIAGNOSIS — R600 Localized edema: Secondary | ICD-10-CM | POA: Insufficient documentation

## 2023-04-08 DIAGNOSIS — D6959 Other secondary thrombocytopenia: Secondary | ICD-10-CM | POA: Diagnosis not present

## 2023-04-08 DIAGNOSIS — Z5111 Encounter for antineoplastic chemotherapy: Secondary | ICD-10-CM | POA: Diagnosis present

## 2023-04-08 DIAGNOSIS — C155 Malignant neoplasm of lower third of esophagus: Secondary | ICD-10-CM

## 2023-04-08 DIAGNOSIS — E785 Hyperlipidemia, unspecified: Secondary | ICD-10-CM | POA: Diagnosis not present

## 2023-04-08 DIAGNOSIS — J90 Pleural effusion, not elsewhere classified: Secondary | ICD-10-CM | POA: Insufficient documentation

## 2023-04-08 DIAGNOSIS — Z79899 Other long term (current) drug therapy: Secondary | ICD-10-CM | POA: Insufficient documentation

## 2023-04-08 DIAGNOSIS — Z923 Personal history of irradiation: Secondary | ICD-10-CM | POA: Diagnosis not present

## 2023-04-08 DIAGNOSIS — G620 Drug-induced polyneuropathy: Secondary | ICD-10-CM | POA: Insufficient documentation

## 2023-04-08 DIAGNOSIS — E039 Hypothyroidism, unspecified: Secondary | ICD-10-CM | POA: Diagnosis not present

## 2023-04-08 DIAGNOSIS — K649 Unspecified hemorrhoids: Secondary | ICD-10-CM | POA: Insufficient documentation

## 2023-04-08 DIAGNOSIS — D709 Neutropenia, unspecified: Secondary | ICD-10-CM | POA: Insufficient documentation

## 2023-04-08 DIAGNOSIS — R0609 Other forms of dyspnea: Secondary | ICD-10-CM | POA: Diagnosis not present

## 2023-04-08 DIAGNOSIS — R7989 Other specified abnormal findings of blood chemistry: Secondary | ICD-10-CM | POA: Diagnosis not present

## 2023-04-08 DIAGNOSIS — J449 Chronic obstructive pulmonary disease, unspecified: Secondary | ICD-10-CM | POA: Diagnosis not present

## 2023-04-08 DIAGNOSIS — K589 Irritable bowel syndrome without diarrhea: Secondary | ICD-10-CM | POA: Insufficient documentation

## 2023-04-08 LAB — CBC WITH DIFFERENTIAL (CANCER CENTER ONLY)
Abs Immature Granulocytes: 0.02 10*3/uL (ref 0.00–0.07)
Basophils Absolute: 0 10*3/uL (ref 0.0–0.1)
Basophils Relative: 0 %
Eosinophils Absolute: 0 10*3/uL (ref 0.0–0.5)
Eosinophils Relative: 1 %
HCT: 37 % (ref 36.0–46.0)
Hemoglobin: 13 g/dL (ref 12.0–15.0)
Immature Granulocytes: 0 %
Lymphocytes Relative: 25 %
Lymphs Abs: 1.4 10*3/uL (ref 0.7–4.0)
MCH: 32.7 pg (ref 26.0–34.0)
MCHC: 35.1 g/dL (ref 30.0–36.0)
MCV: 93 fL (ref 80.0–100.0)
Monocytes Absolute: 0.7 10*3/uL (ref 0.1–1.0)
Monocytes Relative: 12 %
Neutro Abs: 3.6 10*3/uL (ref 1.7–7.7)
Neutrophils Relative %: 62 %
Platelet Count: 196 10*3/uL (ref 150–400)
RBC: 3.98 MIL/uL (ref 3.87–5.11)
RDW: 14.5 % (ref 11.5–15.5)
WBC Count: 5.8 10*3/uL (ref 4.0–10.5)
nRBC: 0 % (ref 0.0–0.2)

## 2023-04-08 LAB — CMP (CANCER CENTER ONLY)
ALT: 11 U/L (ref 0–44)
AST: 14 U/L — ABNORMAL LOW (ref 15–41)
Albumin: 4 g/dL (ref 3.5–5.0)
Alkaline Phosphatase: 76 U/L (ref 38–126)
Anion gap: 8 (ref 5–15)
BUN: 8 mg/dL (ref 8–23)
CO2: 30 mmol/L (ref 22–32)
Calcium: 9.1 mg/dL (ref 8.9–10.3)
Chloride: 91 mmol/L — ABNORMAL LOW (ref 98–111)
Creatinine: 1.09 mg/dL — ABNORMAL HIGH (ref 0.44–1.00)
GFR, Estimated: 53 mL/min — ABNORMAL LOW (ref >60)
Glucose, Bld: 76 mg/dL (ref 70–99)
Potassium: 3.7 mmol/L (ref 3.5–5.1)
Sodium: 129 mmol/L — ABNORMAL LOW (ref 135–145)
Total Bilirubin: 0.5 mg/dL (ref 0.0–1.2)
Total Protein: 6.4 g/dL — ABNORMAL LOW (ref 6.5–8.1)

## 2023-04-08 LAB — BRAIN NATRIURETIC PEPTIDE: B Natriuretic Peptide: 937 pg/mL — ABNORMAL HIGH (ref 0.0–100.0)

## 2023-04-08 LAB — CEA (ACCESS): CEA (CHCC): 15.51 ng/mL — ABNORMAL HIGH (ref 0.00–5.00)

## 2023-04-08 MED ORDER — FUROSEMIDE 20 MG PO TABS: 20.0000 mg | ORAL_TABLET | Freq: Every day | ORAL | 1 refills | Status: DC

## 2023-04-08 MED ORDER — SODIUM CHLORIDE 0.9% FLUSH
10.0000 mL | INTRAVENOUS | Status: AC | PRN
  Administered 2023-04-08: 10 mL via INTRAVENOUS

## 2023-04-08 MED ORDER — HEPARIN SOD (PORK) LOCK FLUSH 100 UNIT/ML IV SOLN
500.0000 [IU] | Freq: Once | INTRAVENOUS | Status: AC
  Administered 2023-04-08: 500 [IU] via INTRAVENOUS

## 2023-04-08 NOTE — Progress Notes (Signed)
 [facility] OFFICE PROGRESS NOTE   Diagnosis: Esophagus cancer  INTERVAL HISTORY:   Ms. Bedgood returns as scheduled.  She completed cycle 2 Taxol/ramucirumab 03/25/2023.  She was seen in the office for an unscheduled visit 04/03/2023 evaluation of a worsening cough, edema.  BNP returned elevated at 1100.  Chest CT showed bilateral pleural effusions with associated atelectatic changes.  No PE.  Persistent distal thickening in the esophagus.  Echocardiogram 04/04/2023 with LVEF 55 to 60%, normal LV function, no regional wall motion abnormalities.  She was started on Lasix 40 mg daily.  She notes significant improvement in the edema since beginning Lasix.  She notes trace swelling at the left ankle and foot.  She no longer has leg edema.  Abdomen is no longer distended.  Cough has resolved.  She has mild dyspnea on exertion which is her baseline.  Objective:  Vital signs in last 24 hours:  Blood pressure (!) 119/58, pulse 84, temperature 98 F (36.7 C), temperature source Oral, resp. rate 18, height 5\' 5"  (1.651 m), weight 135 lb 6.4 oz (61.4 kg), SpO2 97%.    HEENT: No thrush or ulcers. Resp: Lungs clear bilaterally. Cardio: Regular rate and rhythm. GI: Abdomen soft and nontender.  No hepatosplenomegaly. Vascular: Trace left ankle edema. Skin: No rash. Port-A-Cath without erythema.  Lab Results:  Lab Results  Component Value Date   WBC 5.8 04/08/2023   HGB 13.0 04/08/2023   HCT 37.0 04/08/2023   MCV 93.0 04/08/2023   PLT 196 04/08/2023   NEUTROABS 3.6 04/08/2023    Imaging:  No results found.  Medications: I have reviewed the patient's current medications.  Assessment/Plan: Distal esophagus cancer Esophagram 11/21/2020-moderate to severe esophageal dysmotility, narrowing of the distal esophagus just above the GE junction to 4 mm, 13 mm barium tablet stuck in the distal esophagus Upper endoscopy 12/13/2020-severe esophagitis with nodularity and stricturing at  36 cm-adenocarcinoma, HER2 negative, no loss of mismatch repair protein expression, PD-L1 combined positive score less than 1% CT chest 12/14/2020-accentuated density at the distal esophageal lumen above a small type I hiatal hernia concerning for malignancy, nonpathologically enlarged AP window and right hilar nodes PET 12/21/2020-abnormal FDG uptake at the distal esophagus, no evidence of nodal or distant metastatic disease Radiation 01/08/2021-02/16/2021 Cycle 1 weekly Taxol/carboplatin 01/08/2021 Cycle 2 weekly Taxol/carboplatin 01/15/2021 Treatment held 01/22/2021 due to neutropenia Positive COVID test 02/02/2021 Taxol/carboplatin 02/15/2021 Endoscopy 04/30/2021-shallow ulceration at the distal esophagus, nodularity at the gastric cardia-biopsies negative for malignancy CTs 04/02/2022-small mediastinal and right hilar lymph nodes.  Esophagus is mildly patulous decreased from previous in the mid to upper aspect of the mediastinum.  More distally the wall thickening of the esophagus appears slightly decreased.  Small hiatal hernia.  The area of thickening does correspond to the abnormality on PET-CT.  Emphysema. Endoscopy 04/15/2022-Short segment salmon-colored mucosa 1 cm above the Z-line.  Scarring at the gastroesophageal junction/Z-line with nonobstructing narrowing.  Nodular mucosa in the gastric cardia immediately distal to the Z-line.  Pathology on GE junction nodule-invasive moderately differentiated adenocarcinoma, tumor arises within a background of high-grade dysplasia, mild chronic active gastritis with foveolar hyperplasia, negative for squamous epithelium and intestinal metaplasia.  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  Negative for HER2 by IHC, PD-L1 CPS 2% PET scan 04/25/2022-increased radiotracer uptake associated with the distal esophagus at the level of the GE junction.  No evidence of nodal metastasis or distant metastatic disease. Cycle 1 FOLFOX  06/04/2022 Cycle 2 FOLFOX 06/18/2022, oxaliplatin dose reduced secondary  thrombocytopenia Cycle 3 FOLFOX 06/24/2022, oxaliplatin dose reduced secondary thrombocytopenia, 5-FU dose reduced secondary to hand/foot syndrome 07/16/2022 chemotherapy held due to thrombocytopenia, dehydration Cycle 4 FOLFOX 07/23/2022, oxaliplatin dose reduced due to thrombocytopenia Cycle 5 FOLFOX 08/05/2022 CT chest 08/13/2022-mild focal wall thickening at the distal esophagus, 8 mm gastropathic node-not FDG avid on PET, unchanged small mediastinal nodes no evidence of metastatic disease in the chest Upper endoscopy 08/14/2022-benign-appearing mild stenosis in the distal esophagus-microscopic focus of adenocarcinoma Cycle 6 FOLFOX 09/04/2022 Maintenance 5-fluorouracil via pump every 2 weeks beginning 10/30/2022 5-fluorouracil infusion 11/14/2022 5-FU  infusion 11/27/2022 5-FU infusion 12/11/2022 5-FU infusion 12/25/2022 5-FU infusion 01/08/2023 CT chest 01/16/2023-stable distal esophageal thickening, no evidence of metastatic disease Upper endoscopy 02/12/2023-localized mucosal changes characterized by nodularity and scarring in the distal esophagus (cardiac mucosa with mild chronic nonspecific carditis, negative for intestinal metaplasia or dysplasia).  Localized mild mucosal changes characterized by nodularity in the cardia (invasive moderately differentiated adenocarcinoma arising in background of high-grade dysplasia); foundation 1-tumor mutation burden 10, HRD signature negative, microsatellite stable, ERBB2 amplification Cycle 1 Taxol/ramucirumab 03/04/2023 Cycle 2 held 03/18/2023 due to neutropenia Cycle 2 Taxol/ramucirumab 03/25/2023, Taxol dose reduced Solid dysphagia secondary #1, resolved COPD on chest CT 12/14/2020 Hemorrhoids Hyperlipidemia Irritable bowel syndrome Hypothyroidism Tobacco use Thrombocytopenia secondary to chemotherapy Hand/foot syndrome-5-FU dose reduced with cycle 3 Oxaliplatin neuropathy-mild foot  numbness and loss of vibratory sense 08/20/2022 Right fibular fracture 09/11/2022 Syncopal episode following multiple bowel movements 03/06/2023 Cough, leg edema-BNP returned elevated at 1100.  Chest CT showed bilateral pleural effusions with associated atelectatic changes.  No PE.  Persistent distal thickening in the esophagus.  Echocardiogram 04/04/2023 with LVEF 55 to 60%, normal LV function, no regional wall motion abnormalities.  Lasix 40 mg daily initiated.  Disposition: Ms. Haberl has completed 2 cycles of Taxol/ramucirumab.  She developed significant peripheral edema following cycle 2.  Chest CT showed pleural effusions.  2D echo was unremarkable.  Edema and shortness of breath, cough resolved with initiation of Lasix.  She understands the symptoms she experienced could be treatment related.  She does not want to proceed with treatment today.  She would like to return in 1 week for further discussion, possible treatment.  For now she will decrease Lasix to a dose of 20 mg daily and discontinue after 3 days.  She will contact the office with recurrent edema.  We will obtain a chest x-ray for follow-up of pleural effusions when she returns in 1 week.  We are available to see her sooner if needed.  Patient seen with Dr. Truett Perna.    [name] ANP/GNP-BC   04/08/2023  10:33 AM This was a shared visit with [name].  Ms. Levi Aland appears significantly improved today.  An echocardiogram was unremarkable and the chest CT did not reveal pulmonary emboli.  She appears to have developed edema secondary to paclitaxel or ramucirumab.  The Lasix will be tapered off.  She does not wish to receive treatment today.  She understands the chance of recurrent edema with repeat treatment.  We will consider resuming Taxol/ramucirumab when she returns next week.  I was present for greater than 50% of today's visit.  I performed medical decision making.  [name], MD

## 2023-04-08 NOTE — Patient Instructions (Signed)
 Central Line in Adults: What to Expect A central line is a soft tube that is put into a vein so that it goes to a large vein above your heart. It can be used to: Give you medicine or fluids. Give you nutrients. Take blood or give you blood for testing or treatments. Give chemotherapy or dialysis. Provide IV access if veins in your hands or arms are difficult to use. Types of central lines There are four main types of central lines: Peripherally inserted central catheter (PICC) line. This type is usually put in the upper arm and goes up the arm to the vein above your heart. This is used for one week or longer. Tunneled central line. This type is placed in a large vein in the neck, chest, or groin. It is tunneled under the skin and brought out through a small cut in your skin. Non-tunneled central line. This type is used for a shorter time than other types, usually for 7 days at the most. It is put in the neck, chest, or groin. Implanted port. This type can stay in place longer than other types of central lines. It is normally put in the upper chest but can also be placed in the upper arm or the belly. Surgery is needed to put it in and take it out. The type of central line you get will depend on how long you need it and your medical condition. Tell a doctor about: Any allergies you have. All medicines you take. These include vitamins, herbs, eye drops, and creams. Any problems you or family members have had with anesthesia. Any bleeding problems you have. Any surgeries you have had. Any medical conditions you have. Whether you're pregnant or may be pregnant. What are the risks? Your doctor will talk with you about risks. These may include: Infection. A blood clot. Bleeding. Getting a hole or crack in the central line. If this happens, the central line will need to be replaced. The catheter moving or coming out of place. A collapsed lung. Damage to other structures or  organs. What happens before the procedure? Medicines Ask about changing or stopping: Any medicines you take. Any vitamins, herbs, or supplements you take. Do not take aspirin or ibuprofen unless you're told to. Surgery safety For your safety, you may: Need to wash your skin with a soap that kills germs. Get antibiotics. Have your procedure site marked. Have hair removed at the procedure site. General instructions Eat and drink as told. Plan to have a responsible adult take you home from the hospital or clinic. Ask if you'll be staying overnight in the hospital. If you'll be going home right after the procedure, plan to have a responsible adult: Drive you home from the hospital or clinic. You won't be allowed to drive. Stay with you for the time you're told. What happens during the procedure? An IV will be put into one of your veins. You may be given: A sedative to help you relax. Anesthesia to keep you from feeling pain. Your skin will be cleaned with a soap that kills germs, and you may be covered with clean sheet called a drape. Your blood pressure, heart rate, breathing rate, and blood oxygen level will be checked during the procedure. The central line will be put into the vein and moved through it to the correct spot. The doctor may use X-ray equipment to help guide the central line to the right place. A bandage will be placed over the insertion area.  These steps may vary. Ask what you can expect. What can I expect after the procedure? You will watched closely until you leave. This includes checking your pain level, blood pressure, heart rate, and breathing rate. Caps may be put on the ends of the central line tubes. If you were given a sedative, do not drive or use machines until you're told it's safe. A sedative can make you sleepy. Follow these instructions at home: Caring for the tube  Follow instructions from your doctor about: Flushing the tube. Cleaning the tube and  the area around it. Only use germ-free, also called sterile, supplies to flush the central line. The supplies should be from your doctor, a pharmacy, or another place that your doctor recommends. Before you flush the tube or clean the area around it: Wash your hands with soap and water for at least 20 seconds. If you can't use soap and water, use hand sanitizer. Clean the central line hub with rubbing alcohol. To do this: Clean the central line hub with a new alcohol wipe. Scrub it using a twisting motion and rub for 10 to 15 seconds or for 30 twists. Follow the manufacturer's instructions. Be sure you scrub the top of the hub, not just the sides. Let the hub dry before use. Keep it from touching anything while drying. Do not re-use alcohol pads. Caring for your skin Check the skin around the central line every day for signs of infection. Check for: Redness, swelling, or pain. Fluid or blood. Warmth. Pus or a bad smell. Keep the area where the tube was put in clean and dry. Change bandages only as told by your doctor. Keep your bandage dry. If a bandage gets wet, have it changed right away. Storing and throwing away supplies Keep your supplies in a clean, dry location. Throw away any used syringes in a container that is only for sharp items, called a sharps container. You can buy a sharps container from a pharmacy, or you can make one by using an empty hard plastic bottle with a cover. Place any used bandages or infusion bags into a plastic bag. Throw that bag in the trash. General instructions Follow instructions from your doctor for the type of device that you have. Keep the tube clamped unless it's being used. If you or someone else accidentally pulls on the tube, make sure: The bandage is OK. There is no bleeding. The tube has not been pulled out. Do not use scissors or sharp objects near the tube. Do not take baths, swim, or use a hot tub until you're told it's OK. Ask if you can  shower. Ask your doctor what activities are safe for you. Your doctor may tell you not to lift anything or move your arm too much on the side of your central line. Contact a doctor if: You have signs of an infection where the tube was put in. The skin is irritated near the bandage. You catheter appears to be getting longer. This may mean it is coming out of the vein. Get help right away if: You have: A fever or chills. Shortness of breath. Pain in your chest. A fast heartbeat. Swelling in your neck, face, chest, or arm. You feel dizzy or you faint. There are red lines coming from where the tube was put in. The area where the tube was put in is bleeding and the bleeding won't stop. Your tube is hard to flush. You do not get a blood return from the  tube. The tube gets loose or comes out. The tube has a hole or a tear. The tube leaks. This information is not intended to replace advice given to you by your health care provider. Make sure you discuss any questions you have with your health care provider. Document Revised: 08/19/2022 Document Reviewed: 08/19/2022 Elsevier Patient Education  2024 [employer].

## 2023-04-08 NOTE — Addendum Note (Signed)
 Addended by: Dimitri Ped on: 04/08/2023 11:36 AM   Modules accepted: Orders

## 2023-04-09 ENCOUNTER — Other Ambulatory Visit: Payer: Self-pay

## 2023-04-14 ENCOUNTER — Ambulatory Visit (HOSPITAL_BASED_OUTPATIENT_CLINIC_OR_DEPARTMENT_OTHER)
Admission: RE | Admit: 2023-04-14 | Discharge: 2023-04-14 | Disposition: A | Discharge status: Home / Self Care | Source: Ambulatory Visit | Attending: Nurse Practitioner | Admitting: Nurse Practitioner

## 2023-04-14 DIAGNOSIS — C155 Malignant neoplasm of lower third of esophagus: Secondary | ICD-10-CM | POA: Insufficient documentation

## 2023-04-14 DIAGNOSIS — R0609 Other forms of dyspnea: Secondary | ICD-10-CM | POA: Insufficient documentation

## 2023-04-15 ENCOUNTER — Inpatient Hospital Stay

## 2023-04-15 ENCOUNTER — Encounter: Payer: Self-pay | Admitting: Oncology

## 2023-04-15 ENCOUNTER — Inpatient Hospital Stay (HOSPITAL_BASED_OUTPATIENT_CLINIC_OR_DEPARTMENT_OTHER): Admitting: Nurse Practitioner

## 2023-04-15 VITALS — BP 124/67 | HR 90 | Temp 98.1°F | Resp 18 | Ht 65.0 in | Wt 135.4 lb

## 2023-04-15 VITALS — BP 182/95 | HR 87

## 2023-04-15 DIAGNOSIS — C155 Malignant neoplasm of lower third of esophagus: Secondary | ICD-10-CM

## 2023-04-15 DIAGNOSIS — Z5111 Encounter for antineoplastic chemotherapy: Secondary | ICD-10-CM | POA: Diagnosis not present

## 2023-04-15 LAB — CBC WITH DIFFERENTIAL (CANCER CENTER ONLY)
Abs Immature Granulocytes: 0.01 10*3/uL (ref 0.00–0.07)
Basophils Absolute: 0 10*3/uL (ref 0.0–0.1)
Basophils Relative: 0 %
Eosinophils Absolute: 0.1 10*3/uL (ref 0.0–0.5)
Eosinophils Relative: 2 %
HCT: 37.1 % (ref 36.0–46.0)
Hemoglobin: 13 g/dL (ref 12.0–15.0)
Immature Granulocytes: 0 %
Lymphocytes Relative: 25 %
Lymphs Abs: 1.5 10*3/uL (ref 0.7–4.0)
MCH: 32.5 pg (ref 26.0–34.0)
MCHC: 35 g/dL (ref 30.0–36.0)
MCV: 92.8 fL (ref 80.0–100.0)
Monocytes Absolute: 0.6 10*3/uL (ref 0.1–1.0)
Monocytes Relative: 10 %
Neutro Abs: 3.6 10*3/uL (ref 1.7–7.7)
Neutrophils Relative %: 63 %
Platelet Count: 213 10*3/uL (ref 150–400)
RBC: 4 MIL/uL (ref 3.87–5.11)
RDW: 14.6 % (ref 11.5–15.5)
WBC Count: 5.7 10*3/uL (ref 4.0–10.5)
nRBC: 0 % (ref 0.0–0.2)

## 2023-04-15 LAB — CMP (CANCER CENTER ONLY)
ALT: 8 U/L (ref 0–44)
AST: 15 U/L (ref 15–41)
Albumin: 4.1 g/dL (ref 3.5–5.0)
Alkaline Phosphatase: 73 U/L (ref 38–126)
Anion gap: 6 (ref 5–15)
BUN: 8 mg/dL (ref 8–23)
CO2: 29 mmol/L (ref 22–32)
Calcium: 9.3 mg/dL (ref 8.9–10.3)
Chloride: 94 mmol/L — ABNORMAL LOW (ref 98–111)
Creatinine: 0.89 mg/dL (ref 0.44–1.00)
GFR, Estimated: >60 mL/min (ref >60)
Glucose, Bld: 90 mg/dL (ref 70–99)
Potassium: 4 mmol/L (ref 3.5–5.1)
Sodium: 129 mmol/L — ABNORMAL LOW (ref 135–145)
Total Bilirubin: 0.5 mg/dL (ref 0.0–1.2)
Total Protein: 6.5 g/dL (ref 6.5–8.1)

## 2023-04-15 LAB — TOTAL PROTEIN, URINE DIPSTICK: Protein, ur: NEGATIVE mg/dL

## 2023-04-15 MED ORDER — FAMOTIDINE IN NACL 20-0.9 MG/50ML-% IV SOLN
20.0000 mg | Freq: Once | INTRAVENOUS | Status: AC
  Administered 2023-04-15: 20 mg via INTRAVENOUS
  Filled 2023-04-15: qty 50

## 2023-04-15 MED ORDER — DIPHENHYDRAMINE HCL 50 MG/ML IJ SOLN
25.0000 mg | Freq: Once | INTRAMUSCULAR | Status: AC
  Administered 2023-04-15: 25 mg via INTRAVENOUS
  Filled 2023-04-15: qty 1

## 2023-04-15 MED ORDER — HEPARIN SOD (PORK) LOCK FLUSH 100 UNIT/ML IV SOLN
500.0000 [IU] | Freq: Once | INTRAVENOUS | Status: AC | PRN
  Administered 2023-04-15: 500 [IU]

## 2023-04-15 MED ORDER — DEXAMETHASONE SODIUM PHOSPHATE 10 MG/ML IJ SOLN
10.0000 mg | Freq: Once | INTRAMUSCULAR | Status: AC
  Administered 2023-04-15: 10 mg via INTRAVENOUS
  Filled 2023-04-15: qty 1

## 2023-04-15 MED ORDER — SODIUM CHLORIDE 0.9 % IV SOLN
8.0000 mg/kg | Freq: Once | INTRAVENOUS | Status: AC
  Administered 2023-04-15: 500 mg via INTRAVENOUS
  Filled 2023-04-15: qty 50

## 2023-04-15 MED ORDER — PACLITAXEL CHEMO INJECTION 300 MG/50ML
60.0000 mg/m2 | Freq: Once | INTRAVENOUS | Status: AC
  Administered 2023-04-15: 102 mg via INTRAVENOUS
  Filled 2023-04-15: qty 17

## 2023-04-15 MED ORDER — SODIUM CHLORIDE 0.9 % IV SOLN: INTRAVENOUS | Status: DC

## 2023-04-15 MED ORDER — SODIUM CHLORIDE 0.9% FLUSH
10.0000 mL | INTRAVENOUS | Status: DC | PRN
Start: 2023-04-15 — End: 2023-04-15
  Administered 2023-04-15: 10 mL

## 2023-04-15 NOTE — Progress Notes (Addendum)
 [facility] OFFICE PROGRESS NOTE   Diagnosis: Esophagus cancer  INTERVAL HISTORY:   [name] returns as scheduled.  She completed cycle 2 Taxol/ramcirumab 03/25/2023.  Treatment subsequently placed on hold due to edema.  Echocardiogram was unremarkable and chest CT was negative for pulmonary emboli.  The edema resolved with Lasix.  She has continued Lasix 20 mg daily.  Edema continues to be resolved.  No nausea or vomiting.  No mouth sores.  No change in baseline bowel habits.  Stable neuropathy symptoms, predated current treatment.  Stable mild dyspnea on exertion, baseline.  Morning cough which she attributes to smoking.  Objective:  Vital signs in last 24 hours:  Blood pressure 124/67, pulse 90, temperature 98.1 F (36.7 C), temperature source Temporal, resp. rate 18, height 5\' 5"  (1.651 m), weight 135 lb 6.4 oz (61.4 kg), SpO2 98%.    HEENT: No thrush or ulcers. Resp: Lungs clear bilaterally. Cardio: Regular rate and rhythm. GI: Abdomen soft and nontender.  No hepatosplenomegaly. Vascular: No leg edema. Skin: No rash. Port-A-Cath without erythema.  Lab Results:  Lab Results  Component Value Date   WBC 5.7 04/15/2023   HGB 13.0 04/15/2023   HCT 37.1 04/15/2023   MCV 92.8 04/15/2023   PLT 213 04/15/2023   NEUTROABS 3.6 04/15/2023    Imaging:  DG Chest 2 View Result Date: 04/15/2023 CLINICAL DATA:  eso cancer, f/u pleural effusions EXAM: CHEST - 2 VIEW COMPARISON:  Chest XR, 04/01/2023.  CTA chest, 04/03/2023. FINDINGS: Support lines; RIGHT jugular-approach port, with catheter tip well-positioned at the superior cavoatrial junction. Cardiac silhouette is within normal limits. Aortic arch atherosclerosis. Lungs are well inflated. No focal consolidation or mass. Mild blunting of the LEFT costophrenic angle, without overt pleural effusion. No pneumothorax. No acute displaced fracture. IMPRESSION: 1. No acute cardiopulmonary process. 2. Mild blunting of the  LEFT costophrenic angle, without overt pleural effusion. 3.  Aortic Atherosclerosis (ICD10-I70.0). Electronically Signed   By: Roanna Banning M.D.   On: 04/15/2023 09:57    Medications: I have reviewed the patient's current medications.  Assessment/Plan: Distal esophagus cancer Esophagram 11/21/2020-moderate to severe esophageal dysmotility, narrowing of the distal esophagus just above the GE junction to 4 mm, 13 mm barium tablet stuck in the distal esophagus Upper endoscopy 12/13/2020-severe esophagitis with nodularity and stricturing at 36 cm-adenocarcinoma, HER2 negative, no loss of mismatch repair protein expression, PD-L1 combined positive score less than 1% CT chest 12/14/2020-accentuated density at the distal esophageal lumen above a small type I hiatal hernia concerning for malignancy, nonpathologically enlarged AP window and right hilar nodes PET 12/21/2020-abnormal FDG uptake at the distal esophagus, no evidence of nodal or distant metastatic disease Radiation 01/08/2021-02/16/2021 Cycle 1 weekly Taxol/carboplatin 01/08/2021 Cycle 2 weekly Taxol/carboplatin 01/15/2021 Treatment held 01/22/2021 due to neutropenia Positive COVID test 02/02/2021 Taxol/carboplatin 02/15/2021 Endoscopy 04/30/2021-shallow ulceration at the distal esophagus, nodularity at the gastric cardia-biopsies negative for malignancy CTs 04/02/2022-small mediastinal and right hilar lymph nodes.  Esophagus is mildly patulous decreased from previous in the mid to upper aspect of the mediastinum.  More distally the wall thickening of the esophagus appears slightly decreased.  Small hiatal hernia.  The area of thickening does correspond to the abnormality on PET-CT.  Emphysema. Endoscopy 04/15/2022-Short segment salmon-colored mucosa 1 cm above the Z-line.  Scarring at the gastroesophageal junction/Z-line with nonobstructing narrowing.  Nodular mucosa in the gastric cardia immediately distal to the Z-line.  Pathology on GE junction  nodule-invasive moderately differentiated adenocarcinoma, tumor arises within a background of  high-grade dysplasia, mild chronic active gastritis with foveolar hyperplasia, negative for squamous epithelium and intestinal metaplasia.  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  Negative for HER2 by IHC, PD-L1 CPS 2%. CLDN 18.2 negative, less than 5% of tumor cells with 2+ and/or 3+ membrane staining. PET scan 04/25/2022-increased radiotracer uptake associated with the distal esophagus at the level of the GE junction.  No evidence of nodal metastasis or distant metastatic disease. Cycle 1 FOLFOX 06/04/2022 Cycle 2 FOLFOX 06/18/2022, oxaliplatin dose reduced secondary thrombocytopenia Cycle 3 FOLFOX 06/24/2022, oxaliplatin dose reduced secondary thrombocytopenia, 5-FU dose reduced secondary to hand/foot syndrome 07/16/2022 chemotherapy held due to thrombocytopenia, dehydration Cycle 4 FOLFOX 07/23/2022, oxaliplatin dose reduced due to thrombocytopenia Cycle 5 FOLFOX 08/05/2022 CT chest 08/13/2022-mild focal wall thickening at the distal esophagus, 8 mm gastropathic node-not FDG avid on PET, unchanged small mediastinal nodes no evidence of metastatic disease in the chest Upper endoscopy 08/14/2022-benign-appearing mild stenosis in the distal esophagus-microscopic focus of adenocarcinoma Cycle 6 FOLFOX 09/04/2022 Maintenance 5-fluorouracil via pump every 2 weeks beginning 10/30/2022 5-fluorouracil infusion 11/14/2022 5-FU  infusion 11/27/2022 5-FU infusion 12/11/2022 5-FU infusion 12/25/2022 5-FU infusion 01/08/2023 CT chest 01/16/2023-stable distal esophageal thickening, no evidence of metastatic disease Upper endoscopy 02/12/2023-localized mucosal changes characterized by nodularity and scarring in the distal esophagus (cardiac mucosa with mild chronic nonspecific carditis, negative for intestinal metaplasia or dysplasia).  Localized mild mucosal changes characterized by nodularity in the  cardia (invasive moderately differentiated adenocarcinoma arising in background of high-grade dysplasia); foundation 1-tumor mutation burden 10, HRD signature negative, microsatellite stable, ERBB2 amplification.  CLDN18.2 not evaluable due to insufficient tumor cells.  PD-L1-quantity not sufficient. 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  Chest CT showed bilateral pleural effusions with associated atelectatic changes.  No PE.  Persistent distal thickening in the esophagus.  Echocardiogram 04/04/2023 with LVEF 55 to 60%, normal LV function, no regional wall motion abnormalities.  Lasix 40 mg daily initiated.  Edema resolved.  Chest x-ray 04/14/2023-mild blunting of the left costophrenic angle without overt pleural effusion.  Disposition: [name] appears stable.  She has completed 2 cycles of Taxol/ramucirumab.  She developed significant edema following cycle 2.  Echocardiogram was unremarkable and chest CT negative for PE.  The edema responded/resolved with Lasix.  She understands the peripheral edema could recur following today's treatment.  We discussed various strategies including prophylactic steroids, Lasix.  She is comfortable proceeding with treatment today as scheduled.  She will continue Lasix 20 mg daily.  She will contact the office if the edema recurs.  CBC and  chemistry panel reviewed.  Labs adequate to proceed as above.  She will return for follow-up and treatment in 2 weeks.  We are available to see her sooner if needed.  Plan reviewed with Dr. Truett Perna.    [name] ANP/GNP-BC   04/15/2023  11:44 AM

## 2023-04-15 NOTE — Patient Instructions (Signed)
 [facility] OF [facility]   Discharge Instructions: Thank you for choosing [facility] to provide your oncology and hematology care.   If you have a lab appointment with the Cancer Center, please go directly to the Cancer Center and check in at the registration area.   Wear comfortable clothing and clothing appropriate for easy access to any Portacath or PICC line.   We strive to give you quality time with your provider. You may need to reschedule your appointment if you arrive late (15 or more minutes).  Arriving late affects you and other patients whose appointments are after yours.  Also, if you miss three or more appointments without notifying the office, you may be dismissed from the clinic at the provider's discretion.      For prescription refill requests, have your pharmacy contact our office and allow 72 hours for refills to be completed.    Today you received the following chemotherapy and/or immunotherapy agents Cyramza/Taxol      To help prevent nausea and vomiting after your treatment, we encourage you to take your nausea medication as directed.  BELOW ARE SYMPTOMS THAT SHOULD BE REPORTED IMMEDIATELY: *FEVER GREATER THAN 100.4 F (38 C) OR HIGHER *CHILLS OR SWEATING *NAUSEA AND VOMITING THAT IS NOT CONTROLLED WITH YOUR NAUSEA MEDICATION *UNUSUAL SHORTNESS OF BREATH *UNUSUAL BRUISING OR BLEEDING *URINARY PROBLEMS (pain or burning when urinating, or frequent urination) *BOWEL PROBLEMS (unusual diarrhea, constipation, pain near the anus) TENDERNESS IN MOUTH AND THROAT WITH OR WITHOUT PRESENCE OF ULCERS (sore throat, sores in mouth, or a toothache) UNUSUAL RASH, SWELLING OR PAIN  UNUSUAL VAGINAL DISCHARGE OR ITCHING   Items with * indicate a potential emergency and should be followed up as soon as possible or go to the Emergency Department if any problems should occur.  Please show the CHEMOTHERAPY ALERT CARD or  IMMUNOTHERAPY ALERT CARD at check-in to the Emergency Department and triage nurse.  Should you have questions after your visit or need to cancel or reschedule your appointment, please contact [facility]  Dept: [phone]  and follow the prompts.  Office hours are 8:00 a.m. to 4:30 p.m. Monday - Friday. Please note that voicemails left after 4:00 p.m. may not be returned until the following business day.  We are closed weekends and major holidays. You have access to a nurse at all times for urgent questions. Please call the main number to the clinic Dept: [phone] and follow the prompts.   For any non-urgent questions, you may also contact your provider using MyChart. We now offer e-Visits for anyone 65 and older to request care online for non-urgent symptoms. For details visit [email or web address].   Also download the MyChart app! Go to the app store, search "MyChart", open the app, select [place], and log in with your MyChart username and password.  Ramucirumab Injection What is this medication? RAMUCIRUMAB (ra mue SIR ue mab) treats some types of cancer. It works by blocking a protein that causes cancer cells to grow and multiply. This helps to slow or stop the spread of cancer cells. It is a monoclonal antibody. This medicine may be used for other purposes; ask your health care provider or pharmacist if you have questions. COMMON BRAND NAME(S): Cyramza What should I tell my care team before I take this medication? They need to know if you have any of these conditions: Blood clots  Having or recent surgery Heart attack High blood pressure History of a tear in your stomach or intestines Liver disease Protein in your urine Stomach bleeding Stroke Thyroid disease An unusual or allergic reaction to ramucirumab, other medications, foods, dyes, or preservatives Pregnant or trying to get pregnant Breast-feeding How should I  use this medication? This medication is injected into a vein. It is given by your care team in a hospital or clinic setting. Talk to your care team about the use of this medication in children. Special care may be needed. Overdosage: If you think you have taken too much of this medicine contact a poison control center or emergency room at once. NOTE: This medicine is only for you. Do not share this medicine with others. What if I miss a dose? Keep appointments for follow-up doses. It is important not to miss your dose. Call your care team if you are unable to keep an appointment. What may interact with this medication? Interactions have not been studied. This list may not describe all possible interactions. Give your health care provider a list of all the medicines, herbs, non-prescription drugs, or dietary supplements you use. Also tell them if you smoke, drink alcohol, or use illegal drugs. Some items may interact with your medicine. What should I watch for while using this medication? Your condition will be monitored carefully while you are receiving this medication. You may need blood work while taking this medication. This medication may make you feel generally unwell. This is not uncommon as chemotherapy can affect health cells as well as cancer cells. Report any side effects. Continue your course of treatment even though you feel ill unless your care team tells you to stop. This medication may increase your risk to bruise or bleed. Call your care team if you notice any unusual bleeding. Before having surgery, talk to your care team to make sure it is ok. This medication can increase the risk of poor healing of your surgical site or wound. You will need to stop this medication for 28 days before surgery. After surgery, wait at least 2 weeks before restarting this medication. Make sure the surgical site or wound is healed enough before restarting this medication. Talk to your care team if  questions. Talk to your care team if you may be pregnant. Serious birth defects can occur if you take this medication during pregnancy and for 3 months after the last dose. You will need a negative pregnancy test before starting this medication. Contraception is recommended while taking this medication and for 3 months after the last dose. Your care team can help you find the option that works for you. Do not breastfeed while taking this medication and for 2 months after the last dose. This medication may cause infertility. Talk to your care team if you are concerned about your fertility. What side effects may I notice from receiving this medication? Side effects that you should report to your care team as soon as possible: Allergic reactions--skin rash, itching, hives, swelling of the face, lips, tongue, or throat Bleeding--bloody or black, tar-like stools, vomiting blood or brown material that looks like coffee grounds, red or dark brown urine, small red or purple spots on skin, unusual bruising or bleeding Dizziness, loss of balance or coordination, confusion or trouble speaking Heart attack--pain or tightness in the chest, shoulders, arms, or jaw, nausea, shortness of breath, cold or clammy skin, feeling faint or lightheaded Increase in blood pressure Infection--fever, chills, cough, sore throat, wounds that  don't heal, pain or trouble when passing urine, general feeling of discomfort or being unwell Infusion reactions--chest pain, shortness of breath or trouble breathing, feeling faint or lightheaded Kidney injury--decrease in the amount of urine, swelling of the ankles, hands, or feet Liver injury--right upper belly pain, loss of appetite, nausea, light-colored stool, dark yellow or brown urine, yellowing skin or eyes, unusual weakness or fatigue Low thyroid levels (hypothyroidism)--unusual weakness or fatigue, increased sensitivity to cold, constipation, hair loss, dry skin, weight gain,  feelings of depression Stomach pain that is severe, does not go away, or gets worse Stroke--sudden numbness or weakness of the face, arm, or leg, trouble speaking, confusion, trouble walking, loss of balance or coordination, dizziness, severe headache, change in vision Sudden and severe headache, confusion, change in vision, seizures, which may be signs of posterior reversible encephalopathy syndrome (PRES) Side effects that usually do not require medical attention (report to your care team if they continue or are bothersome): Diarrhea Fatigue Stomach pain Swelling of the ankles, hands, or feet This list may not describe all possible side effects. Call your doctor for medical advice about side effects. You may report side effects to FDA at 1-800-FDA-1088. Where should I keep my medication? This medication is given in a hospital or clinic. It will not be stored at home. NOTE: This sheet is a summary. It may not cover all possible information. If you have questions about this medicine, talk to your doctor, pharmacist, or health care provider.  2024 Elsevier/Gold Standard (2021-06-14 00:00:00)  Paclitaxel Injection What is this medication? PACLITAXEL (PAK li TAX el) treats some types of cancer. It works by slowing down the growth of cancer cells. This medicine may be used for other purposes; ask your health care provider or pharmacist if you have questions. COMMON BRAND NAME(S): Onxol, Taxol What should I tell my care team before I take this medication? They need to know if you have any of these conditions: Heart disease Liver disease Low white blood cell levels An unusual or allergic reaction to paclitaxel, other medications, foods, dyes, or preservatives If you or your partner are pregnant or trying to get pregnant Breast-feeding How should I use this medication? This medication is injected into a vein. It is given by your care team in a hospital or clinic setting. Talk to your care  team about the use of this medication in children. While it may be given to children for selected conditions, precautions do apply. Overdosage: If you think you have taken too much of this medicine contact a poison control center or emergency room at once. NOTE: This medicine is only for you. Do not share this medicine with others. What if I miss a dose? Keep appointments for follow-up doses. It is important not to miss your dose. Call your care team if you are unable to keep an appointment. What may interact with this medication? Do not take this medication with any of the following: Live virus vaccines Other medications may affect the way this medication works. Talk with your care team about all of the medications you take. They may suggest changes to your treatment plan to lower the risk of side effects and to make sure your medications work as intended. This list may not describe all possible interactions. Give your health care provider a list of all the medicines, herbs, non-prescription drugs, or dietary supplements you use. Also tell them if you smoke, drink alcohol, or use illegal drugs. Some items may interact with your medicine. What  should I watch for while using this medication? Your condition will be monitored carefully while you are receiving this medication. You may need blood work while taking this medication. This medication may make you feel generally unwell. This is not uncommon as chemotherapy can affect healthy cells as well as cancer cells. Report any side effects. Continue your course of treatment even though you feel ill unless your care team tells you to stop. This medication can cause serious allergic reactions. To reduce the risk, your care team may give you other medications to take before receiving this one. Be sure to follow the directions from your care team. This medication may increase your risk of getting an infection. Call your care team for advice if you get a fever,  chills, sore throat, or other symptoms of a cold or flu. Do not treat yourself. Try to avoid being around people who are sick. This medication may increase your risk to bruise or bleed. Call your care team if you notice any unusual bleeding. Be careful brushing or flossing your teeth or using a toothpick because you may get an infection or bleed more easily. If you have any dental work done, tell your dentist you are receiving this medication. Talk to your care team if you may be pregnant. Serious birth defects can occur if you take this medication during pregnancy. Talk to your care team before breastfeeding. Changes to your treatment plan may be needed. What side effects may I notice from receiving this medication? Side effects that you should report to your care team as soon as possible: Allergic reactions--skin rash, itching, hives, swelling of the face, lips, tongue, or throat Heart rhythm changes--fast or irregular heartbeat, dizziness, feeling faint or lightheaded, chest pain, trouble breathing Increase in blood pressure Infection--fever, chills, cough, sore throat, wounds that don't heal, pain or trouble when passing urine, general feeling of discomfort or being unwell Low blood pressure--dizziness, feeling faint or lightheaded, blurry vision Low red blood cell level--unusual weakness or fatigue, dizziness, headache, trouble breathing Painful swelling, warmth, or redness of the skin, blisters or sores at the infusion site Pain, tingling, or numbness in the hands or feet Slow heartbeat--dizziness, feeling faint or lightheaded, confusion, trouble breathing, unusual weakness or fatigue Unusual bruising or bleeding Side effects that usually do not require medical attention (report to your care team if they continue or are bothersome): Diarrhea Hair loss Joint pain Loss of appetite Muscle pain Nausea Vomiting This list may not describe all possible side effects. Call your doctor for  medical advice about side effects. You may report side effects to FDA at 1-800-FDA-1088. Where should I keep my medication? This medication is given in a hospital or clinic. It will not be stored at home. NOTE: This sheet is a summary. It may not cover all possible information. If you have questions about this medicine, talk to your doctor, pharmacist, or health care provider.  2024 Elsevier/Gold Standard (2021-06-12 00:00:00)

## 2023-04-15 NOTE — Progress Notes (Signed)
 Patient seen by [name] NP today  Vitals are within treatment parameters:Yes   Labs are within treatment parameters: Yes   Treatment plan has been signed: Yes   Per physician team, Patient is ready for treatment and there are NO modifications to the treatment plan.

## 2023-04-15 NOTE — Progress Notes (Signed)
 Pt completed treatment. BP post treatment elevated 184/87 machine and 182/95 manual. Dr Truett Perna notified. Per Dr Truett Perna, ok to discharge, patient to take bp at home when she gets home, if bp is still elevated, take Norvasc 5 mg and retake bp, if patient continues to have elevated bp, contact the officer or go to the nearest emergency department. Pt understands and agrees.

## 2023-04-16 ENCOUNTER — Ambulatory Visit: Payer: Medicare Other | Admitting: Family Medicine

## 2023-04-16 ENCOUNTER — Encounter: Payer: Self-pay | Admitting: Family Medicine

## 2023-04-16 ENCOUNTER — Encounter: Payer: Self-pay | Admitting: Nurse Practitioner

## 2023-04-16 VITALS — BP 154/90 | HR 105 | Temp 98.3°F | Ht 65.0 in | Wt 136.0 lb

## 2023-04-16 DIAGNOSIS — R0989 Other specified symptoms and signs involving the circulatory and respiratory systems: Secondary | ICD-10-CM | POA: Diagnosis not present

## 2023-04-16 DIAGNOSIS — K589 Irritable bowel syndrome without diarrhea: Secondary | ICD-10-CM | POA: Diagnosis not present

## 2023-04-16 DIAGNOSIS — E039 Hypothyroidism, unspecified: Secondary | ICD-10-CM | POA: Diagnosis not present

## 2023-04-16 DIAGNOSIS — C155 Malignant neoplasm of lower third of esophagus: Secondary | ICD-10-CM | POA: Diagnosis not present

## 2023-04-16 MED ORDER — AMLODIPINE BESYLATE 2.5 MG PO TABS: 2.5000 mg | ORAL_TABLET | Freq: Every day | ORAL | 0 refills | Status: DC

## 2023-04-16 NOTE — Telephone Encounter (Signed)
 Forwarded to NP.

## 2023-04-16 NOTE — Progress Notes (Signed)
 New Patient Office Visit  Subjective    Patient ID: Alison Garner, female    DOB: 27-May-1950  Age: 72 y.o. MRN: [id number]  CC:  Chief Complaint  Patient presents with   Establish Care    Currently in cancer treatment and wants to discuss BP getting high due to tretments and medication makes it too low    HPI Alison Garner presents to establish care Her daughter is with her today.  She recently moved from [place].  She was previously a patient with [facility], Dr. Majel Homer in [place].   She is currently under the care of Dr. Thornton Papas and NP [name], oncology. Esophageal cancer diagnosed in 2022 and went into remission Oct 2023 cancer returned  Restarted treatment April 2024   States she was having fluid retention and was prescribed Lasix 20 mg daily.  She still takes this according to fluid retention and treatment schedule.  States her current treatment is causing her to have significantly elevated BPs and she had a syncopal episode where she went to the emergency department on 03/06/2023.  Her blood pressure was significantly elevated at that time.  She was started on amlodipine 5 mg daily.  She took 3 weeks off from treatment after syncopal episode. She has had to take other weeks off from treatment due to neutropenia.  On Lasix 20 mg daily - off for the past 4 days.  She is now taking amlodipine 5 mg, started today  She is concerned about fluctuations in blood pressure.  He reports BP as low as 81/55 however did not feel symptomatic with this reading. Denies dizziness, chest pain, palpitations, shortness of breath.  She also has a history of hypothyroidism and is taking levothyroxine daily.  HLD-on Vytorin  COPD on CT 2022  Outpatient Encounter Medications as of 04/16/2023  Medication Sig   amLODipine (NORVASC) 2.5 MG tablet Take 1 tablet (2.5 mg total) by mouth daily.   Cholecalciferol (VITAMIN D3) 50 MCG (2000 UT)  CAPS Take 2,000 Units by mouth 2 (two) times daily.   ezetimibe-simvastatin (VYTORIN) 10-20 MG tablet Take 1 tablet by mouth every other day.   furosemide (LASIX) 20 MG tablet Take 1 tablet (20 mg total) by mouth daily for 3 days.   imipramine (TOFRANIL) 25 MG tablet Take 25 mg by mouth at bedtime. Takes 2 tablets in bedtime   levothyroxine (SYNTHROID, LEVOTHROID) 25 MCG tablet Take 0.025 mcg by mouth daily before breakfast.   pantoprazole (PROTONIX) 40 MG tablet Take 1 tablet (40 mg total) by mouth 2 (two) times daily before a meal.   potassium chloride SA (KLOR-CON M) 20 MEQ tablet Take 1 tablet (20 mEq total) by mouth daily.   prochlorperazine (COMPAZINE) 10 MG tablet Take 1 tablet (10 mg total) by mouth every 6 (six) hours as needed for nausea or vomiting.   [DISCONTINUED] amLODipine (NORVASC) 5 MG tablet Take 1 tablet (5 mg total) by mouth daily.   ondansetron (ZOFRAN) 8 MG tablet Take 1 tablet (8 mg total) by mouth every 8 (eight) hours as needed for nausea or vomiting (May use day 3 after chemo as needed for nausea). (Patient not taking: Reported on 06/18/2022)   [DISCONTINUED] chlorpheniramine-HYDROcodone (TUSSIONEX) 10-8 MG/5ML Take 5 mLs by mouth every 12 (twelve) hours as needed for cough. (Patient not taking: Reported on 04/15/2023)   [DISCONTINUED] dexamethasone (DECADRON) 2 MG tablet Take 5 tablets (10 mg total) by mouth as directed. Take 10 mg (#5 tablets)  at 10 pm night before 1st chemotherapy and 10 mg (#5 tablets) at 6 am day of  1st chemotherapy (Patient not taking: Reported on 04/15/2023)   [DISCONTINUED] lidocaine-prilocaine (EMLA) cream Apply 1 Application topically as needed (apply to [facility] site 1-2 hours before appt).   [DISCONTINUED] potassium chloride SA (KLOR-CON M) 20 MEQ tablet Take 1 tablet (20 mEq total) by mouth daily.   Facility-Administered Encounter Medications as of 04/16/2023  Medication   sodium chloride flush (NS) 0.9 % injection 10 mL   sodium chloride flush (NS)  0.9 % injection 10 mL    Past Medical History:  Diagnosis Date   Allergy    Bactrim   Arthritis    Carotid bruit    Diverticulosis    Emphysema of lung (HCC) 2022   by CT only   Esophageal cancer (HCC)    External hemorrhoids    GERD (gastroesophageal reflux disease) 2022   Esophagus cancer   Hiatal hernia    History of radiation therapy    Esophagus- 01/08/21-02/16/21- Dr. Antony Blackbird   Hyperlipidemia    Hypothyroidism    Internal hemorrhoids    Irritable bowel syndrome    Kidney infection    UTI hx   Kidney stones    Lichen planus    Macular cyst, hole, or pseudohole, unspecified eye    Thyroid disease    thyroid nodule   Vitamin D deficiency     Past Surgical History:  Procedure Laterality Date   COLONOSCOPY  2017   JMP-MAC-good prep-tics/hems/normal colon   DILATION AND CURETTAGE OF UTERUS  1982   IR IMAGING GUIDED PORT INSERTION  05/17/2022   UPPER GASTROINTESTINAL ENDOSCOPY  12/2020    Family History  Problem Relation Age of Onset   Heart disease Mother    Hypertension Mother    Arthritis Mother    Stroke Mother    Heart disease Father    Heart failure Father    Thyroid disease Brother    Hypertension Brother    Heart disease Maternal Aunt    Arthritis Maternal Aunt    Other Maternal Aunt        MDS   Heart disease Maternal Uncle    Heart failure Maternal Uncle    Varicose Veins Maternal Uncle    Myasthenia gravis Paternal Aunt    Heart disease Paternal Uncle    Heart failure Paternal Uncle    COPD Paternal Uncle    Diabetes Maternal Grandmother    Cancer Paternal Grandfather    Colon cancer Neg Hx    Esophageal cancer Neg Hx    Stomach cancer Neg Hx    Rectal cancer Neg Hx    Colon polyps Neg Hx     Social History   Socioeconomic History   Marital status: Single    Spouse name: Not on file   Number of children: Not on file   Years of education: Not on file   Highest education level: Bachelor's degree (e.g., BA, AB, BS)   Occupational History   Not on file  Tobacco Use   Smoking status: Every Day    Current packs/day: 0.50    Average packs/day: 0.5 packs/day for 50.0 years (25.0 ttl pk-yrs)    Types: Cigarettes   Smokeless tobacco: Never  Vaping Use   Vaping status: Never Used  Substance and Sexual Activity   Alcohol use: Yes    Comment: Maybe yearly on vacation   Drug use: No   Sexual activity: Not Currently  Birth control/protection: Post-menopausal  Other Topics Concern   Not on file  Social History Narrative   Not on file   Social Drivers of Health   Financial Resource Strain: Low Risk  (04/15/2023)   Overall Financial Resource Strain (CARDIA)    Difficulty of Paying Living Expenses: Not hard at all  Food Insecurity: No Food Insecurity (04/15/2023)   Hunger Vital Sign    Worried About Running Out of Food in the Last Year: Never true    Ran Out of Food in the Last Year: Never true  Transportation Needs: No Transportation Needs (04/15/2023)   PRAPARE - Administrator, Civil Service (Medical): No    Lack of Transportation (Non-Medical): No  Physical Activity: Unknown (04/15/2023)   Exercise Vital Sign    Days of Exercise per Week: 0 days    Minutes of Exercise per Session: Not on file  Stress: No Stress Concern Present (04/15/2023)   [employer] of Occupational Health - Occupational Stress Questionnaire    Feeling of Stress : Not at all  Social Connections: Moderately Isolated (04/15/2023)   Social Connection and Isolation Panel [NHANES]    Frequency of Communication with Friends and Family: More than three times a week    Frequency of Social Gatherings with Friends and Family: More than three times a week    Attends Religious Services: More than 4 times per year    Active Member of [employer] or Organizations: No    Attends Banker Meetings: Not on file    Marital Status: Divorced  Intimate Partner Violence: Not At Risk (06/04/2022)   Humiliation, Afraid, Rape,  and Kick questionnaire    Fear of Current or Ex-Partner: No    Emotionally Abused: No    Physically Abused: No    Sexually Abused: No    Review of Systems  Constitutional:  Negative for chills, fever and malaise/fatigue.  Respiratory:  Negative for shortness of breath and wheezing.   Cardiovascular:  Negative for chest pain, palpitations and leg swelling.  Gastrointestinal:  Negative for abdominal pain, constipation, diarrhea, nausea and vomiting.  Genitourinary:  Negative for dysuria, frequency and urgency.  Neurological:  Negative for dizziness, focal weakness and headaches.        Objective    BP (!) 154/90 (BP Location: Left Arm, Patient Position: Sitting)   Pulse (!) 105   Temp 98.3 F (36.8 C) (Temporal)   Ht 5\' 5"  (1.651 m)   Wt 136 lb (61.7 kg)   SpO2 97%   BMI 22.63 kg/m   Physical Exam Constitutional:      General: She is not in acute distress.    Appearance: She is not ill-appearing.  Eyes:     Extraocular Movements: Extraocular movements intact.     Conjunctiva/sclera: Conjunctivae normal.  Cardiovascular:     Rate and Rhythm: Normal rate and regular rhythm.  Pulmonary:     Effort: Pulmonary effort is normal.     Breath sounds: Normal breath sounds.  Musculoskeletal:     Cervical back: Normal range of motion and neck supple.  Skin:    General: Skin is warm and dry.  Neurological:     General: No focal deficit present.     Mental Status: She is alert and oriented to person, place, and time.  Psychiatric:        Mood and Affect: Mood normal.        Behavior: Behavior normal.        Thought Content:  Thought content normal.         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  Her daughter is with her today.  Recently moved from [place].  She is under the care of Dr. Truett Perna, oncology, for esophageal cancer.  Currently undergoing chemotherapy treatment every 2 to 3 weeks.  She has had concerning blood pressures that have been significantly elevated as well as systolic pressure in the 80s at times.  She and her daughter are requesting assistance with blood pressure management.  Her current chemotherapy seems to be contributing to the fluctuations.  She has Lasix 20 mg as well as amlodipine 5 mg.  I will also prescribe a 2.5 mg amlodipine tablet for her to use if she does not feel comfortable taking the 5 mg tablet that day.  Discussed closely monitoring blood pressure but if it is causing anxiety to cut back on how often she checks it.  She is euvolemic today.  Follow-up here in 4 weeks and bring in blood pressure readings for the month as well as a medication log.  Return in about 4 weeks (around 05/14/2023).   Hetty Blend, NP-C

## 2023-04-17 ENCOUNTER — Telehealth: Payer: Self-pay | Admitting: *Deleted

## 2023-04-17 ENCOUNTER — Other Ambulatory Visit: Payer: Self-pay

## 2023-04-17 NOTE — Telephone Encounter (Signed)
 Called Alison Garner to f/u on BP--today was 15/79. She reports seeing her PCP and Amlodipine has been reduced to 2.5 mg daily.

## 2023-04-22 ENCOUNTER — Ambulatory Visit: Payer: Medicare Other

## 2023-04-22 ENCOUNTER — Ambulatory Visit: Payer: Medicare Other | Admitting: Nurse Practitioner

## 2023-04-22 ENCOUNTER — Other Ambulatory Visit: Payer: Medicare Other

## 2023-04-24 ENCOUNTER — Other Ambulatory Visit (HOSPITAL_BASED_OUTPATIENT_CLINIC_OR_DEPARTMENT_OTHER): Payer: Medicare Other

## 2023-04-27 ENCOUNTER — Other Ambulatory Visit: Payer: Self-pay | Admitting: Oncology

## 2023-04-28 ENCOUNTER — Other Ambulatory Visit: Payer: Self-pay | Admitting: *Deleted

## 2023-04-28 ENCOUNTER — Encounter: Payer: Self-pay | Admitting: Nurse Practitioner

## 2023-04-28 DIAGNOSIS — C155 Malignant neoplasm of lower third of esophagus: Secondary | ICD-10-CM

## 2023-04-28 DIAGNOSIS — E039 Hypothyroidism, unspecified: Secondary | ICD-10-CM

## 2023-04-28 DIAGNOSIS — E785 Hyperlipidemia, unspecified: Secondary | ICD-10-CM

## 2023-04-28 DIAGNOSIS — R739 Hyperglycemia, unspecified: Secondary | ICD-10-CM

## 2023-04-28 NOTE — Progress Notes (Signed)
 MyChart message from Ms. Nanda asking for A1c, lipid panel and TSH be drawn tomorrow for her PCP, who reports putting in orders for this. Orders not seen. Ordered the TSH and lipid panel since diagnosis code is seen to support these. Will have lab draw extra lavender tomorrow for A1c once diagnosis code is given from PCP. Sent secure chat to Dr. Suezanne Jacquet for code.

## 2023-04-28 NOTE — Progress Notes (Signed)
 PCP response to order A1c for hyperglycemia.

## 2023-04-29 ENCOUNTER — Inpatient Hospital Stay (HOSPITAL_BASED_OUTPATIENT_CLINIC_OR_DEPARTMENT_OTHER): Admitting: Nurse Practitioner

## 2023-04-29 ENCOUNTER — Inpatient Hospital Stay

## 2023-04-29 ENCOUNTER — Encounter: Payer: Self-pay | Admitting: Nurse Practitioner

## 2023-04-29 VITALS — BP 127/76 | HR 90 | Temp 98.1°F | Resp 18 | Ht 65.0 in | Wt 134.0 lb

## 2023-04-29 DIAGNOSIS — R739 Hyperglycemia, unspecified: Secondary | ICD-10-CM

## 2023-04-29 DIAGNOSIS — C155 Malignant neoplasm of lower third of esophagus: Secondary | ICD-10-CM | POA: Diagnosis not present

## 2023-04-29 DIAGNOSIS — Z5111 Encounter for antineoplastic chemotherapy: Secondary | ICD-10-CM | POA: Diagnosis not present

## 2023-04-29 DIAGNOSIS — E785 Hyperlipidemia, unspecified: Secondary | ICD-10-CM

## 2023-04-29 DIAGNOSIS — E039 Hypothyroidism, unspecified: Secondary | ICD-10-CM

## 2023-04-29 LAB — CMP (CANCER CENTER ONLY)
ALT: 9 U/L (ref 0–44)
AST: 15 U/L (ref 15–41)
Albumin: 4.1 g/dL (ref 3.5–5.0)
Alkaline Phosphatase: 71 U/L (ref 38–126)
Anion gap: 8 (ref 5–15)
BUN: 12 mg/dL (ref 8–23)
CO2: 29 mmol/L (ref 22–32)
Calcium: 9.1 mg/dL (ref 8.9–10.3)
Chloride: 94 mmol/L — ABNORMAL LOW (ref 98–111)
Creatinine: 0.98 mg/dL (ref 0.44–1.00)
GFR, Estimated: >60 mL/min (ref >60)
Glucose, Bld: 108 mg/dL — ABNORMAL HIGH (ref 70–99)
Potassium: 4.3 mmol/L (ref 3.5–5.1)
Sodium: 131 mmol/L — ABNORMAL LOW (ref 135–145)
Total Bilirubin: 0.9 mg/dL (ref 0.0–1.2)
Total Protein: 6.3 g/dL — ABNORMAL LOW (ref 6.5–8.1)

## 2023-04-29 LAB — TSH: TSH: 5.402 u[IU]/mL — ABNORMAL HIGH (ref 0.350–4.500)

## 2023-04-29 LAB — CBC WITH DIFFERENTIAL (CANCER CENTER ONLY)
Abs Immature Granulocytes: 0.01 10*3/uL (ref 0.00–0.07)
Basophils Absolute: 0 10*3/uL (ref 0.0–0.1)
Basophils Relative: 0 %
Eosinophils Absolute: 0.1 10*3/uL (ref 0.0–0.5)
Eosinophils Relative: 2 %
HCT: 35.7 % — ABNORMAL LOW (ref 36.0–46.0)
Hemoglobin: 12.5 g/dL (ref 12.0–15.0)
Immature Granulocytes: 0 %
Lymphocytes Relative: 41 %
Lymphs Abs: 1.4 10*3/uL (ref 0.7–4.0)
MCH: 32.6 pg (ref 26.0–34.0)
MCHC: 35 g/dL (ref 30.0–36.0)
MCV: 93 fL (ref 80.0–100.0)
Monocytes Absolute: 0.4 10*3/uL (ref 0.1–1.0)
Monocytes Relative: 12 %
Neutro Abs: 1.5 10*3/uL — ABNORMAL LOW (ref 1.7–7.7)
Neutrophils Relative %: 45 %
Platelet Count: 178 10*3/uL (ref 150–400)
RBC: 3.84 MIL/uL — ABNORMAL LOW (ref 3.87–5.11)
RDW: 14.9 % (ref 11.5–15.5)
WBC Count: 3.4 10*3/uL — ABNORMAL LOW (ref 4.0–10.5)
nRBC: 0 % (ref 0.0–0.2)

## 2023-04-29 LAB — CEA (ACCESS): CEA (CHCC): 15.17 ng/mL — ABNORMAL HIGH (ref 0.00–5.00)

## 2023-04-29 LAB — LIPID PANEL
Cholesterol: 168 mg/dL (ref 0–200)
HDL: 67 mg/dL (ref >40)
LDL Cholesterol: 85 mg/dL (ref 0–99)
Total CHOL/HDL Ratio: 2.5 ratio
Triglycerides: 78 mg/dL (ref <150)
VLDL: 16 mg/dL (ref 0–40)

## 2023-04-29 LAB — HEMOGLOBIN A1C
Hgb A1c MFr Bld: 5.5 % (ref 4.8–5.6)
Mean Plasma Glucose: 111.15 mg/dL

## 2023-04-29 NOTE — Progress Notes (Signed)
 [facility] OFFICE PROGRESS NOTE   Diagnosis:  Esophagus cancer  INTERVAL HISTORY:   Alison Garner returns as scheduled.  She completed cycle 3 Taxol/ramucirumab 04/15/2023.  She did not follow-up edema.  She had a cough 1 night.  She has continued Lasix 20 mg daily.  She had an increase in blood pressure before leaving the office 04/15/2023.  She begin Norvasc 5 mg daily.  She decreased the dose to 2.5 mg daily.  Blood pressure then went "too low".  She in general did not feel well after the most recent treatment.  No nausea or vomiting.  No mouth sores.  She noted a change in bowel habits with watery bowel movements.  Objective:  Vital signs in last 24 hours:  Blood pressure 127/76, pulse 90, temperature 98.1 F (36.7 C), temperature source Temporal, resp. rate 18, height 5\' 5"  (1.651 m), weight 134 lb (60.8 kg), SpO2 96%.    HEENT: No thrush or ulcers. Resp: Lungs clear bilaterally. Cardio: Regular rate and rhythm. GI: Abdomen soft and nontender.  No hepatosplenomegaly. Vascular: No leg edema. Skin: No rash. Port-A-Cath without erythema.  Lab Results:  Lab Results  Component Value Date   WBC 3.4 (L) 04/29/2023   HGB 12.5 04/29/2023   HCT 35.7 (L) 04/29/2023   MCV 93.0 04/29/2023   PLT 178 04/29/2023   NEUTROABS 1.5 (L) 04/29/2023    Imaging:  No results found.  Medications: I have reviewed the patient's current medications.  Assessment/Plan: Distal esophagus cancer Esophagram 11/21/2020-moderate to severe esophageal dysmotility, narrowing of the distal esophagus just above the GE junction to 4 mm, 13 mm barium tablet stuck in the distal esophagus Upper endoscopy 12/13/2020-severe esophagitis with nodularity and stricturing at 36 cm-adenocarcinoma, HER2 negative, no loss of mismatch repair protein expression, PD-L1 combined positive score less than 1% CT chest 12/14/2020-accentuated density at the distal esophageal lumen above a small type I hiatal hernia  concerning for malignancy, nonpathologically enlarged AP window and right hilar nodes PET 12/21/2020-abnormal FDG uptake at the distal esophagus, no evidence of nodal or distant metastatic disease Radiation 01/08/2021-02/16/2021 Cycle 1 weekly Taxol/carboplatin 01/08/2021 Cycle 2 weekly Taxol/carboplatin 01/15/2021 Treatment held 01/22/2021 due to neutropenia Positive COVID test 02/02/2021 Taxol/carboplatin 02/15/2021 Endoscopy 04/30/2021-shallow ulceration at the distal esophagus, nodularity at the gastric cardia-biopsies negative for malignancy CTs 04/02/2022-small mediastinal and right hilar lymph nodes.  Esophagus is mildly patulous decreased from previous in the mid to upper aspect of the mediastinum.  More distally the wall thickening of the esophagus appears slightly decreased.  Small hiatal hernia.  The area of thickening does correspond to the abnormality on PET-CT.  Emphysema. Endoscopy 04/15/2022-Short segment salmon-colored mucosa 1 cm above the Z-line.  Scarring at the gastroesophageal junction/Z-line with nonobstructing narrowing.  Nodular mucosa in the gastric cardia immediately distal to the Z-line.  Pathology on GE junction nodule-invasive moderately differentiated adenocarcinoma, tumor arises within a background of high-grade dysplasia, mild chronic active gastritis with foveolar hyperplasia, negative for squamous epithelium and intestinal metaplasia.  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  Negative for HER2 by IHC, PD-L1 CPS 2%. CLDN 18.2 negative, less than 5% of tumor cells with 2+ and/or 3+ membrane staining. PET scan 04/25/2022-increased radiotracer uptake associated with the distal esophagus at the level of the GE junction.  No evidence of nodal metastasis or distant metastatic disease. Cycle 1 FOLFOX 06/04/2022 Cycle 2 FOLFOX 06/18/2022, oxaliplatin dose reduced secondary thrombocytopenia Cycle 3 FOLFOX 06/24/2022, oxaliplatin dose reduced secondary  thrombocytopenia, 5-FU dose reduced secondary  to hand/foot syndrome 07/16/2022 chemotherapy held due to thrombocytopenia, dehydration Cycle 4 FOLFOX 07/23/2022, oxaliplatin dose reduced due to thrombocytopenia Cycle 5 FOLFOX 08/05/2022 CT chest 08/13/2022-mild focal wall thickening at the distal esophagus, 8 mm gastropathic node-not FDG avid on PET, unchanged small mediastinal nodes no evidence of metastatic disease in the chest Upper endoscopy 08/14/2022-benign-appearing mild stenosis in the distal esophagus-microscopic focus of adenocarcinoma Cycle 6 FOLFOX 09/04/2022 Maintenance 5-fluorouracil via pump every 2 weeks beginning 10/30/2022 5-fluorouracil infusion 11/14/2022 5-FU  infusion 11/27/2022 5-FU infusion 12/11/2022 5-FU infusion 12/25/2022 5-FU infusion 01/08/2023 CT chest 01/16/2023-stable distal esophageal thickening, no evidence of metastatic disease Upper endoscopy 02/12/2023-localized mucosal changes characterized by nodularity and scarring in the distal esophagus (cardiac mucosa with mild chronic nonspecific carditis, negative for intestinal metaplasia or dysplasia).  Localized mild mucosal changes characterized by nodularity in the cardia (invasive moderately differentiated adenocarcinoma arising in background of high-grade dysplasia); foundation 1-tumor mutation burden 10, HRD signature negative, microsatellite stable, ERBB2 amplification.  CLDN18.2 not evaluable due to insufficient tumor cells.  PD-L1-quantity not sufficient. 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  Chest CT showed bilateral pleural effusions with associated atelectatic changes.  No PE.  Persistent distal thickening in the esophagus.  Echocardiogram 04/04/2023 with LVEF 55 to 60%, normal LV function, no regional wall motion abnormalities.  Lasix 40 mg daily initiated.  Edema resolved.  Chest x-ray 04/14/2023-mild blunting of the left costophrenic angle without overt pleural effusion.  Disposition: Alison Garner appears stable.  She has completed 3 treatments with Taxol/ramucirumab.  She does not feel she is tolerating the current treatment well and has made the decision to discontinue Taxol/ramucirumab.  She would like to return in 1 week with her daughter for a follow-up visit to discuss other treatment options.    [name] ANP/GNP-BC   04/29/2023  11:48 AM

## 2023-04-29 NOTE — Patient Instructions (Signed)

## 2023-05-01 IMAGING — PT NM PET TUM IMG INITIAL (PI) SKULL BASE T - THIGH
1 of 7 series · 1 of 25 positions shown · non-contrast
Comparison: CT chest 12/14/2020

CLINICAL DATA: Initial treatment strategy for esophageal neoplasm.

EXAM:
NUCLEAR MEDICINE PET SKULL BASE TO THIGH
TECHNIQUE: 6.6 mCi F-18 FDG was injected intravenously. Full-ring PET imaging
was performed from the skull base to thigh after the radiotracer. CT
data was obtained and used for attenuation correction and anatomic
localization.
Fasting blood glucose: 122 mg/dl

[Series 4: ct sk_thigh 5.0 bf37 · axial · 5.0mm · 0.98mm/px · 1 of 212 slices shown]
[im 212/212  brain]
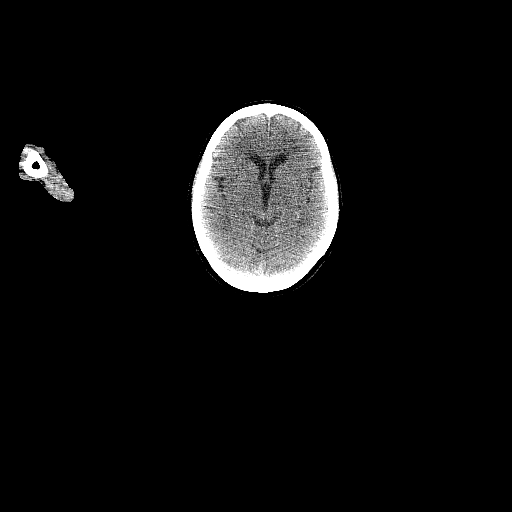

[1 of 25 positions shown; findings below may reference images not displayed]

FINDINGS: Mediastinal blood pool activity: SUV max

Liver activity: SUV max NA

NECK: No hypermetabolic lymph nodes in the neck.

Incidental CT findings: none

CHEST: No FDG avid supraclavicular, axillary, mediastinal, or hilar
lymph nodes.

FDG avid tumor measuring approximately 3.5 cm extends up to the GE
junction. This has an SUV max of 7.68. No surrounding FDG avid lymph
nodes identified in the posterior mediastinum.

Moderate centrilobular emphysema identified. Scarring identified
within the lateral left lung base.

Incidental CT findings: Aortic atherosclerosis. Coronary artery
calcifications.

ABDOMEN/PELVIS: No abnormal hypermetabolic activity within the
liver, pancreas, adrenal glands, or spleen. No hypermetabolic lymph
nodes in the abdomen or pelvis.

Incidental CT findings: Aortic atherosclerosis.  No aneurysm.

SKELETON: No focal hypermetabolic activity to suggest skeletal
metastasis.

Incidental CT findings: none
IMPRESSION: 1. There is abnormal FDG uptake within the distal esophagus
corresponding to known esophageal neoplasm. No signs of tracer avid
nodal metastasis or distant metastatic disease.
2. Aortic Atherosclerosis (V8EAD-4J8.8) and Emphysema (V8EAD-AGJ.3).
3. Coronary artery calcifications.

## 2023-05-08 ENCOUNTER — Other Ambulatory Visit

## 2023-05-08 ENCOUNTER — Inpatient Hospital Stay: Attending: Oncology | Admitting: Nurse Practitioner

## 2023-05-08 ENCOUNTER — Other Ambulatory Visit: Payer: Self-pay | Admitting: Family Medicine

## 2023-05-08 VITALS — BP 123/65 | HR 87 | Temp 98.1°F | Resp 18 | Ht 65.0 in | Wt 134.9 lb

## 2023-05-08 DIAGNOSIS — R5383 Other fatigue: Secondary | ICD-10-CM

## 2023-05-08 DIAGNOSIS — C160 Malignant neoplasm of cardia: Secondary | ICD-10-CM | POA: Diagnosis present

## 2023-05-08 DIAGNOSIS — D6959 Other secondary thrombocytopenia: Secondary | ICD-10-CM | POA: Insufficient documentation

## 2023-05-08 DIAGNOSIS — C155 Malignant neoplasm of lower third of esophagus: Secondary | ICD-10-CM | POA: Diagnosis not present

## 2023-05-08 DIAGNOSIS — G620 Drug-induced polyneuropathy: Secondary | ICD-10-CM | POA: Insufficient documentation

## 2023-05-08 DIAGNOSIS — K649 Unspecified hemorrhoids: Secondary | ICD-10-CM | POA: Insufficient documentation

## 2023-05-08 DIAGNOSIS — J90 Pleural effusion, not elsewhere classified: Secondary | ICD-10-CM | POA: Insufficient documentation

## 2023-05-08 DIAGNOSIS — J439 Emphysema, unspecified: Secondary | ICD-10-CM | POA: Insufficient documentation

## 2023-05-08 NOTE — Progress Notes (Signed)
 DISCONTINUE ON PATHWAY REGIMEN - Gastroesophageal     A cycle is every 28 days:     Ramucirumab      Paclitaxel   **Always confirm dose/schedule in your pharmacy ordering system**  PRIOR TREATMENT: GEOS14: Ramucirumab 8 mg/kg Days 1, 15 + Paclitaxel 80 mg/m2 Days 1, 8, 15 q28 Days Until Progression or Unacceptable Toxicity  START OFF PATHWAY REGIMEN - Gastroesophageal   [id number]:Nivolumab 240 mg IV D1 q14 Days:   A cycle is every 14 days:     Nivolumab   **Always confirm dose/schedule in your pharmacy ordering system**  Patient Characteristics: Distant Metastases (cM1/pM1) / Locally Recurrent Disease, Adenocarcinoma - Esophageal, GE Junction, and Gastric, Third Line and Beyond, HER2 Negative/Unknown and MSS/pMMR or MSI Unknown Therapeutic Status: Local Recurrence (No Additional Staging) Histology: Adenocarcinoma Disease Classification: Esophageal Line of Therapy: [employer] manager Status: Unknown HER2 Status: Negative Intent of Therapy: Non-Curative / Palliative Intent, Discussed with Patient

## 2023-05-08 NOTE — Progress Notes (Signed)
 [facility] OFFICE PROGRESS NOTE   Diagnosis: Esophagus cancer  INTERVAL HISTORY:   Alison Garner returns as scheduled.  She was most recently treated with Taxol/ramucirumab.  She decided to discontinue Taxol/ramucirumab due to poor tolerance.  She feels well.  No dysphagia.  No nausea.  No shortness of breath.  Energy level is improving.  Objective:  Vital signs in last 24 hours:  Blood pressure 123/65, pulse 87, temperature 98.1 F (36.7 C), temperature source Temporal, resp. rate 18, height 5\' 5"  (1.651 m), weight 134 lb 14.4 oz (61.2 kg), SpO2 99%.    HEENT: No thrush or ulcers. Lymphatics: No palpable cervical or supraclavicular lymph nodes. Resp: Lungs clear bilaterally. Cardio: Regular rate and rhythm. GI: Abdomen soft and nontender.  No hepatosplenomegaly. Vascular: No leg edema. Neuro: Alert and oriented. Skin: No rash. Port-A-Cath without erythema.  Lab Results:  Lab Results  Component Value Date   WBC 3.4 (L) 04/29/2023   HGB 12.5 04/29/2023   HCT 35.7 (L) 04/29/2023   MCV 93.0 04/29/2023   PLT 178 04/29/2023   NEUTROABS 1.5 (L) 04/29/2023    Imaging:  No results found.  Medications: I have reviewed the patient's current medications.  Assessment/Plan: Distal esophagus cancer Esophagram 11/21/2020-moderate to severe esophageal dysmotility, narrowing of the distal esophagus just above the GE junction to 4 mm, 13 mm barium tablet stuck in the distal esophagus Upper endoscopy 12/13/2020-severe esophagitis with nodularity and stricturing at 36 cm-adenocarcinoma, HER2 negative, no loss of mismatch repair protein expression, PD-L1 combined positive score less than 1% CT chest 12/14/2020-accentuated density at the distal esophageal lumen above a small type I hiatal hernia concerning for malignancy, nonpathologically enlarged AP window and right hilar nodes PET 12/21/2020-abnormal FDG uptake at the distal esophagus, no evidence of nodal or distant  metastatic disease Radiation 01/08/2021-02/16/2021 Cycle 1 weekly Taxol/carboplatin 01/08/2021 Cycle 2 weekly Taxol/carboplatin 01/15/2021 Treatment held 01/22/2021 due to neutropenia Positive COVID test 02/02/2021 Taxol/carboplatin 02/15/2021 Endoscopy 04/30/2021-shallow ulceration at the distal esophagus, nodularity at the gastric cardia-biopsies negative for malignancy CTs 04/02/2022-small mediastinal and right hilar lymph nodes.  Esophagus is mildly patulous decreased from previous in the mid to upper aspect of the mediastinum.  More distally the wall thickening of the esophagus appears slightly decreased.  Small hiatal hernia.  The area of thickening does correspond to the abnormality on PET-CT.  Emphysema. Endoscopy 04/15/2022-Short segment salmon-colored mucosa 1 cm above the Z-line.  Scarring at the gastroesophageal junction/Z-line with nonobstructing narrowing.  Nodular mucosa in the gastric cardia immediately distal to the Z-line.  Pathology on GE junction nodule-invasive moderately differentiated adenocarcinoma, tumor arises within a background of high-grade dysplasia, mild chronic active gastritis with foveolar hyperplasia, negative for squamous epithelium and intestinal metaplasia.  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  Negative for HER2 by IHC (0), PD-L1 CPS 2%. CLDN 18.2 negative, less than 5% of tumor cells with 2+ and/or 3+ membrane staining. PET scan 04/25/2022-increased radiotracer uptake associated with the distal esophagus at the level of the GE junction.  No evidence of nodal metastasis or distant metastatic disease. Cycle 1 FOLFOX 06/04/2022 Cycle 2 FOLFOX 06/18/2022, oxaliplatin dose reduced secondary thrombocytopenia Cycle 3 FOLFOX 06/24/2022, oxaliplatin dose reduced secondary thrombocytopenia, 5-FU dose reduced secondary to hand/foot syndrome 07/16/2022 chemotherapy held due to thrombocytopenia, dehydration Cycle 4 FOLFOX 07/23/2022, oxaliplatin dose reduced  due to thrombocytopenia Cycle 5 FOLFOX 08/05/2022 CT chest 08/13/2022-mild focal wall thickening at the distal esophagus, 8 mm gastropathic node-not FDG avid on PET, unchanged small mediastinal nodes  no evidence of metastatic disease in the chest Upper endoscopy 08/14/2022-benign-appearing mild stenosis in the distal esophagus-microscopic focus of adenocarcinoma Cycle 6 FOLFOX 09/04/2022 Maintenance 5-fluorouracil via pump every 2 weeks beginning 10/30/2022 5-fluorouracil infusion 11/14/2022 5-FU  infusion 11/27/2022 5-FU infusion 12/11/2022 5-FU infusion 12/25/2022 5-FU infusion 01/08/2023 CT chest 01/16/2023-stable distal esophageal thickening, no evidence of metastatic disease Upper endoscopy 02/12/2023-localized mucosal changes characterized by nodularity and scarring in the distal esophagus (cardiac mucosa with mild chronic nonspecific carditis, negative for intestinal metaplasia or dysplasia).  Localized mild mucosal changes characterized by nodularity in the cardia (invasive moderately differentiated adenocarcinoma arising in background of high-grade dysplasia); foundation 1-tumor mutation burden 10, HRD signature negative, microsatellite stable, ERBB2 amplification.  CLDN18.2 not evaluable due to insufficient tumor cells.  PD-L1-quantity not sufficient. Cycle 1 Taxol/ramucirumab 03/04/2023 Cycle 2 held 03/18/2023 due to neutropenia Cycle 2 Taxol/ramucirumab 03/25/2023, Taxol dose reduced CT chest 04/03/2023-esophagus with wall thickening distally.  Emphysema.  Bilateral pleural effusions.  No PE. 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  Chest CT showed bilateral pleural effusions with associated atelectatic changes.  No PE.  Persistent distal thickening in the esophagus.  Echocardiogram 04/04/2023 with LVEF 55 to 60%, normal LV function, no regional wall motion abnormalities.  Lasix 40 mg daily initiated.  Edema resolved.  Chest x-ray 04/14/2023-mild blunting of the left costophrenic angle without overt pleural effusion.  Disposition: Alison Garner appears stable.  She was most recently treated with Taxol ramucirumab.  At her request treatment was discontinued due to poor tolerance.  We discussed other treatment options to include immunotherapy with nivolumab, FOLFIRI.  We reviewed potential side effects.  She is interested in nivolumab.  We discussed various immune mediated side effects including dermatologic toxicity, colitis, cardiac toxicity, endocrinopathies, neurotoxicity, ocular disorders, pneumonitis, arthritis, allergic reaction.  She was provided with printed information.  She will review the information and let us know her decision by the beginning of next week.  At present she has a follow-up appointment scheduled 05/13/2023.  We will adjust accordingly pending her decision.  Patient seen with Dr. Truett Perna.    [name] ANP/GNP-BC   05/08/2023  2:17 PM  This was a shared visit with [name].  Ms. Campillo had poor tolerance of the paclitaxel/ramucirumab and decided to discontinue this regimen.  We discussed treatment options with her.  We discussed FOLFIRI, nivolumab, and comfort care.  She does not wish to consider FOLFIRI given the potential toxicities associated with irinotecan.  The tumor has low-level PD-L1 expression and a mildly elevated tumor mutation burden.  She is a candidate for nivolumab.  She understands the small  chance of a clinical response with nivolumab.  We reviewed potential toxicities associated with nivolumab including various autoimmune toxicities.  She agrees to proceed.  We will follow her clinical status, the CEA, chest CT, and a repeat endoscopy as measures of clinical response.  A treatment plan was entered today.  I was present for greater than 50% of today's visit.  I performed medical decision making.  [name], MD

## 2023-05-09 ENCOUNTER — Encounter: Payer: Self-pay | Admitting: Nurse Practitioner

## 2023-05-12 ENCOUNTER — Telehealth: Payer: Self-pay | Admitting: Internal Medicine

## 2023-05-12 NOTE — Telephone Encounter (Signed)
 Inbound call from patient stated she was advised to have a repeat endoscopy in July. No recall or documentation of patient due to have a 6 month endoscopy. Please confirm recall date. Thank you.

## 2023-05-12 NOTE — Telephone Encounter (Signed)
 She is starting a new therapy for esophageal cancer with Dr. Truett Perna If Dr. Truett Perna recommends EGD in July it can be scheduled with me as requested in July in [facility]

## 2023-05-12 NOTE — Telephone Encounter (Signed)
 Dr Rhea Belton, please see oncology 05/08/23 office note and advise on whether patient needs endoscopy at this time. If so, is direct endoscopy okay?

## 2023-05-12 NOTE — Telephone Encounter (Signed)
 I have spoken to patient and scheduled endoscopy on 08/13/23 and telephone previsit on 07/28/23. Patient verbalizes understanding of this information.

## 2023-05-13 ENCOUNTER — Other Ambulatory Visit

## 2023-05-13 ENCOUNTER — Other Ambulatory Visit: Payer: Self-pay

## 2023-05-13 ENCOUNTER — Ambulatory Visit

## 2023-05-13 ENCOUNTER — Ambulatory Visit: Admitting: Nurse Practitioner

## 2023-05-14 ENCOUNTER — Ambulatory Visit (INDEPENDENT_AMBULATORY_CARE_PROVIDER_SITE_OTHER): Admitting: Family Medicine

## 2023-05-14 ENCOUNTER — Encounter: Payer: Self-pay | Admitting: Family Medicine

## 2023-05-14 VITALS — BP 138/86 | HR 88 | Temp 97.6°F | Ht 65.0 in | Wt 136.0 lb

## 2023-05-14 DIAGNOSIS — E039 Hypothyroidism, unspecified: Secondary | ICD-10-CM | POA: Diagnosis not present

## 2023-05-14 DIAGNOSIS — C155 Malignant neoplasm of lower third of esophagus: Secondary | ICD-10-CM

## 2023-05-14 DIAGNOSIS — R0989 Other specified symptoms and signs involving the circulatory and respiratory systems: Secondary | ICD-10-CM

## 2023-05-14 NOTE — Patient Instructions (Addendum)
 I am okay with you staying off of your blood pressure medication at this time however, if you start having swelling in your feet or ankles or if your blood pressures consistently higher than 130/80, then I would recommend restarting medication.    We can discuss this if needed or at your follow-up appointment.  Be sure to limit foods high in sodium and elevate your legs when you are sitting.  Try to get up and walk every hour and be as active as you feel like being.

## 2023-05-14 NOTE — Progress Notes (Signed)
 Subjective:     Patient ID: Alison Garner, female    DOB: [birth year], 73 y.o.   MRN: [id number]  Chief Complaint  Patient presents with   Medical Management of Chronic Issues    4 week f/u, hasn't started medication due to treatment hasn't been since March. Has been tracking her BP and running 130,140,160 so doesn't think chemo is out of system yet. Reports its been going up and down w no pattern    HPI  History of Present Illness         Here for 4 wk follow up on HTN. I prescribed her amlodipine 2.5 mg at her last visit due her BP being too low on 5 mg. She has also been taking Lasix prn.   Stopped amlodipine entirely on 03/15/205, states due to BP 84/59   BP 111/68 on the Lasix, so she stopped Lasix on 05/02/2023   March 11th was her last chemo Tx   She has been stressed about trying to decide whether she wants to continue on treatment for her cancer or not.  Selling her house which is stressful.   BP at home has been varying as before.   BP yesterday AM 129/77 and 168/90 in the PM   Has EGD scheduled for early July     Health Maintenance Due  Topic Date Due   Pneumonia Vaccine 72+ Years old (1 of 2 - PCV) 02/11/1956   Hepatitis C Screening  Never done   Zoster Vaccines- Shingrix (1 of 2) Never done   DEXA SCAN  Never done   Colonoscopy  09/20/2016   COVID-19 Vaccine (3 - Moderna risk series) 06/08/2019   DTaP/Tdap/Td (3 - Td or Tdap) 09/05/2021   Medicare Annual Wellness (AWV)  03/27/2023    Past Medical History:  Diagnosis Date   Allergy    Bactrim   Arthritis    Carotid bruit    Diverticulosis    Emphysema of lung (HCC) 2022   by CT only   Esophageal cancer (HCC)    External hemorrhoids    GERD (gastroesophageal reflux disease) 2022   Esophagus cancer   Hiatal hernia    History of radiation therapy    Esophagus- 01/08/21-02/16/21- Dr. Antony Blackbird   Hyperlipidemia    Hypothyroidism    Internal hemorrhoids    Irritable bowel syndrome     Kidney infection    UTI hx   Kidney stones    Lichen planus    Macular cyst, hole, or pseudohole, unspecified eye    Thyroid disease    thyroid nodule   Vitamin D deficiency     Past Surgical History:  Procedure Laterality Date   COLONOSCOPY  2017   JMP-MAC-good prep-tics/hems/normal colon   DILATION AND CURETTAGE OF UTERUS  1982   IR IMAGING GUIDED PORT INSERTION  05/17/2022   UPPER GASTROINTESTINAL ENDOSCOPY  12/2020    Family History  Problem Relation Age of Onset   Heart disease Mother    Hypertension Mother    Arthritis Mother    Stroke Mother    Heart disease Father    Heart failure Father    Thyroid disease Brother    Hypertension Brother    Heart disease Maternal Aunt    Arthritis Maternal Aunt    Other Maternal Aunt        MDS   Heart disease Maternal Uncle    Heart failure Maternal Uncle    Varicose Veins Maternal Uncle    Myasthenia  gravis Paternal Aunt    Heart disease Paternal Uncle    Heart failure Paternal Uncle    COPD Paternal Uncle    Diabetes Maternal Grandmother    Cancer Paternal Grandfather    Colon cancer Neg Hx    Esophageal cancer Neg Hx    Stomach cancer Neg Hx    Rectal cancer Neg Hx    Colon polyps Neg Hx     Social History   Socioeconomic History   Marital status: Single    Spouse name: Not on file   Number of children: Not on file   Years of education: Not on file   Highest education level: Bachelor's degree (e.g., BA, AB, BS)  Occupational History   Not on file  Tobacco Use   Smoking status: Every Day    Current packs/day: 0.50    Average packs/day: 0.5 packs/day for 50.0 years (25.0 ttl pk-yrs)    Types: Cigarettes   Smokeless tobacco: Never  Vaping Use   Vaping status: Never Used  Substance and Sexual Activity   Alcohol use: Yes    Comment: Maybe yearly on vacation   Drug use: No   Sexual activity: Not Currently    Birth control/protection: Post-menopausal  Other Topics Concern   Not on file  Social History  Narrative   Not on file   Social Drivers of Health   Financial Resource Strain: Low Risk  (04/15/2023)   Overall Financial Resource Strain (CARDIA)    Difficulty of Paying Living Expenses: Not hard at all  Food Insecurity: No Food Insecurity (04/15/2023)   Hunger Vital Sign    Worried About Running Out of Food in the Last Year: Never true    Ran Out of Food in the Last Year: Never true  Transportation Needs: No Transportation Needs (04/15/2023)   PRAPARE - Administrator, Civil Service (Medical): No    Lack of Transportation (Non-Medical): No  Physical Activity: Unknown (04/15/2023)   Exercise Vital Sign    Days of Exercise per Week: 0 days    Minutes of Exercise per Session: Not on file  Stress: No Stress Concern Present (04/15/2023)   [employer] of Occupational Health - Occupational Stress Questionnaire    Feeling of Stress : Not at all  Social Connections: Moderately Isolated (04/15/2023)   Social Connection and Isolation Panel [NHANES]    Frequency of Communication with Friends and Family: More than three times a week    Frequency of Social Gatherings with Friends and Family: More than three times a week    Attends Religious Services: More than 4 times per year    Active Member of [employer] or Organizations: No    Attends Engineer, structural: Not on file    Marital Status: Divorced  Intimate Partner Violence: Not At Risk (06/04/2022)   Humiliation, Afraid, Rape, and Kick questionnaire    Fear of Current or Ex-Partner: No    Emotionally Abused: No    Physically Abused: No    Sexually Abused: No    Outpatient Medications Prior to Visit  Medication Sig Dispense Refill   B Complex Vitamins (VITAMIN B-COMPLEX PO) Place under the tongue.     Cholecalciferol (VITAMIN D3) 50 MCG (2000 UT) CAPS Take 2,000 Units by mouth 2 (two) times daily.     ezetimibe-simvastatin (VYTORIN) 10-20 MG tablet Take 1 tablet by mouth every other day.     imipramine (TOFRANIL) 25  MG tablet Take 25 mg by mouth at bedtime. Takes  2 tablets in bedtime     levothyroxine (SYNTHROID, LEVOTHROID) 25 MCG tablet Take 0.025 mcg by mouth daily before breakfast.     pantoprazole (PROTONIX) 40 MG tablet Take 1 tablet (40 mg total) by mouth 2 (two) times daily before a meal. 180 tablet 3   potassium chloride SA (KLOR-CON M) 20 MEQ tablet Take 1 tablet (20 mEq total) by mouth daily. 90 tablet 0   amLODipine (NORVASC) 2.5 MG tablet TAKE 1 TABLET BY MOUTH EVERY DAY (Patient not taking: Reported on 05/14/2023) 30 tablet 0   furosemide (LASIX) 20 MG tablet Take 1 tablet (20 mg total) by mouth daily for 3 days. (Patient not taking: Reported on 05/14/2023) 30 tablet 1   ondansetron (ZOFRAN) 8 MG tablet Take 1 tablet (8 mg total) by mouth every 8 (eight) hours as needed for nausea or vomiting (May use day 3 after chemo as needed for nausea). (Patient not taking: Reported on 05/14/2023) 20 tablet 0   prochlorperazine (COMPAZINE) 10 MG tablet Take 1 tablet (10 mg total) by mouth every 6 (six) hours as needed for nausea or vomiting. 30 tablet 0   Facility-Administered Medications Prior to Visit  Medication Dose Route Frequency Provider Last Rate Last Admin   sodium chloride flush (NS) 0.9 % injection 10 mL  10 mL Intravenous PRN Ladene Artist, MD   10 mL at 03/18/23 0949   sodium chloride flush (NS) 0.9 % injection 10 mL  10 mL Intravenous PRN Ladene Artist, MD   10 mL at 04/08/23 1130    Allergies  Allergen Reactions   Bactrim [Sulfamethoxazole-Trimethoprim] Rash    Review of Systems  Constitutional:  Negative for chills, fever and malaise/fatigue.  Respiratory:  Negative for shortness of breath.   Cardiovascular:  Negative for chest pain, palpitations and leg swelling.  Gastrointestinal:  Negative for abdominal pain, constipation, diarrhea, nausea and vomiting.  Genitourinary:  Negative for dysuria, frequency and urgency.  Neurological:  Negative for dizziness and focal weakness.        Objective:    Physical Exam Constitutional:      General: She is not in acute distress.    Appearance: She is not ill-appearing.  Eyes:     Extraocular Movements: Extraocular movements intact.     Conjunctiva/sclera: Conjunctivae normal.  Cardiovascular:     Rate and Rhythm: Normal rate.  Pulmonary:     Effort: Pulmonary effort is normal.  Musculoskeletal:     Cervical back: Normal range of motion and neck supple.     Right lower leg: No edema.     Left lower leg: No edema.  Skin:    General: Skin is warm and dry.  Neurological:     General: No focal deficit present.     Mental Status: She is alert and oriented to person, place, and time.     Motor: No weakness.     Coordination: Coordination normal.     Gait: Gait normal.  Psychiatric:        Mood and Affect: Mood normal.        Behavior: Behavior normal.        Thought Content: Thought content normal.      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  BP readings at home are labile off of medication as they were on medication but no low readings off of medication.  Reviewed labs from her previous oncology visit.  She will continue to monitor her BP at home. If she has any LE edema and elevated BP, she will take Lasix prn. If her BP is consistently elevated, she will restart amlodipine 2.5 mg.  She is closely followed by oncology and has labs ordered for her next visit.  Follow up here in  July or sooner if needed.   I am having Alison Shankman. Garner maintain her imipramine, ezetimibe-simvastatin, levothyroxine, Vitamin D3, ondansetron, prochlorperazine, pantoprazole, potassium chloride SA, furosemide, amLODipine, and B Complex Vitamins (VITAMIN B-COMPLEX PO).  No orders of the defined types were placed in this encounter.

## 2023-05-15 ENCOUNTER — Encounter: Payer: Self-pay | Admitting: *Deleted

## 2023-05-15 NOTE — Progress Notes (Signed)
 Alison Garner has agreed to be seen when next port flush is due. Scheduling message sent for lab/flush/OV on 5/12 or 5/13.

## 2023-06-10 ENCOUNTER — Encounter: Payer: Self-pay | Admitting: Family Medicine

## 2023-06-10 MED ORDER — IMIPRAMINE HCL 25 MG PO TABS: ORAL_TABLET | ORAL | 0 refills | Status: DC

## 2023-06-10 NOTE — Telephone Encounter (Signed)
 She will be getting those labs done, do you want to wait until they are done to send in rx?

## 2023-06-10 NOTE — Addendum Note (Signed)
 Addended by: Terita Hejl E on: 06/10/2023 03:12 PM   Modules accepted: Orders

## 2023-06-11 NOTE — Telephone Encounter (Signed)
 FYI, getting labs done on the 12th

## 2023-06-16 ENCOUNTER — Encounter: Payer: Self-pay | Admitting: Oncology

## 2023-06-16 ENCOUNTER — Inpatient Hospital Stay (HOSPITAL_BASED_OUTPATIENT_CLINIC_OR_DEPARTMENT_OTHER): Admitting: Oncology

## 2023-06-16 ENCOUNTER — Inpatient Hospital Stay: Attending: Oncology

## 2023-06-16 ENCOUNTER — Encounter (HOSPITAL_COMMUNITY): Payer: Self-pay

## 2023-06-16 ENCOUNTER — Inpatient Hospital Stay

## 2023-06-16 VITALS — BP 97/60 | HR 87 | Temp 97.9°F | Resp 18 | Ht 65.0 in | Wt 133.8 lb

## 2023-06-16 DIAGNOSIS — Z9221 Personal history of antineoplastic chemotherapy: Secondary | ICD-10-CM | POA: Insufficient documentation

## 2023-06-16 DIAGNOSIS — G620 Drug-induced polyneuropathy: Secondary | ICD-10-CM | POA: Insufficient documentation

## 2023-06-16 DIAGNOSIS — C155 Malignant neoplasm of lower third of esophagus: Secondary | ICD-10-CM

## 2023-06-16 DIAGNOSIS — Z923 Personal history of irradiation: Secondary | ICD-10-CM | POA: Diagnosis not present

## 2023-06-16 DIAGNOSIS — R7989 Other specified abnormal findings of blood chemistry: Secondary | ICD-10-CM | POA: Insufficient documentation

## 2023-06-16 DIAGNOSIS — D6959 Other secondary thrombocytopenia: Secondary | ICD-10-CM | POA: Diagnosis not present

## 2023-06-16 DIAGNOSIS — J439 Emphysema, unspecified: Secondary | ICD-10-CM | POA: Insufficient documentation

## 2023-06-16 DIAGNOSIS — R600 Localized edema: Secondary | ICD-10-CM | POA: Diagnosis not present

## 2023-06-16 DIAGNOSIS — D709 Neutropenia, unspecified: Secondary | ICD-10-CM | POA: Diagnosis not present

## 2023-06-16 DIAGNOSIS — R5383 Other fatigue: Secondary | ICD-10-CM

## 2023-06-16 DIAGNOSIS — Z79899 Other long term (current) drug therapy: Secondary | ICD-10-CM | POA: Insufficient documentation

## 2023-06-16 DIAGNOSIS — C158 Malignant neoplasm of overlapping sites of esophagus: Secondary | ICD-10-CM | POA: Diagnosis present

## 2023-06-16 DIAGNOSIS — J90 Pleural effusion, not elsewhere classified: Secondary | ICD-10-CM | POA: Insufficient documentation

## 2023-06-16 LAB — CBC WITH DIFFERENTIAL (CANCER CENTER ONLY)
Abs Immature Granulocytes: 0.01 10*3/uL (ref 0.00–0.07)
Basophils Absolute: 0 10*3/uL (ref 0.0–0.1)
Basophils Relative: 0 %
Eosinophils Absolute: 0.1 10*3/uL (ref 0.0–0.5)
Eosinophils Relative: 1 %
HCT: 39.5 % (ref 36.0–46.0)
Hemoglobin: 13.7 g/dL (ref 12.0–15.0)
Immature Granulocytes: 0 %
Lymphocytes Relative: 30 %
Lymphs Abs: 1.5 10*3/uL (ref 0.7–4.0)
MCH: 31.9 pg (ref 26.0–34.0)
MCHC: 34.7 g/dL (ref 30.0–36.0)
MCV: 92.1 fL (ref 80.0–100.0)
Monocytes Absolute: 0.5 10*3/uL (ref 0.1–1.0)
Monocytes Relative: 9 %
Neutro Abs: 3 10*3/uL (ref 1.7–7.7)
Neutrophils Relative %: 60 %
Platelet Count: 154 10*3/uL (ref 150–400)
RBC: 4.29 MIL/uL (ref 3.87–5.11)
RDW: 14.6 % (ref 11.5–15.5)
WBC Count: 5.1 10*3/uL (ref 4.0–10.5)
nRBC: 0 % (ref 0.0–0.2)

## 2023-06-16 LAB — CMP (CANCER CENTER ONLY)
ALT: 21 U/L (ref 0–44)
AST: 32 U/L (ref 15–41)
Albumin: 4 g/dL (ref 3.5–5.0)
Alkaline Phosphatase: 75 U/L (ref 38–126)
Anion gap: 9 (ref 5–15)
BUN: 8 mg/dL (ref 8–23)
CO2: 26 mmol/L (ref 22–32)
Calcium: 10 mg/dL (ref 8.9–10.3)
Chloride: 98 mmol/L (ref 98–111)
Creatinine: 0.96 mg/dL (ref 0.44–1.00)
GFR, Estimated: >60 mL/min (ref >60)
Glucose, Bld: 66 mg/dL — ABNORMAL LOW (ref 70–99)
Potassium: 4.3 mmol/L (ref 3.5–5.1)
Sodium: 133 mmol/L — ABNORMAL LOW (ref 135–145)
Total Bilirubin: 0.4 mg/dL (ref 0.0–1.2)
Total Protein: 6.5 g/dL (ref 6.5–8.1)

## 2023-06-16 LAB — TSH: TSH: 5.32 u[IU]/mL — ABNORMAL HIGH (ref 0.350–4.500)

## 2023-06-16 LAB — CEA (ACCESS): CEA (CHCC): 12.88 ng/mL — ABNORMAL HIGH (ref 0.00–5.00)

## 2023-06-16 NOTE — Progress Notes (Signed)
 [facility] OFFICE PROGRESS NOTE   Diagnosis: Esophagus cancer  INTERVAL HISTORY:   Alison Garner returns as scheduled.  She feels well.  No dysphagia.  No pain.  She is active at home.  She has decided against salvage systemic therapy for now.  She is scheduled for an upper endoscopy in July.  She has persistent mild numbness in the fingers and toes, but this has improved.  Objective:  Vital signs in last 24 hours:  Blood pressure 97/60, pulse 87, temperature 97.9 F (36.6 C), temperature source Temporal, resp. rate 18, height 5\' 5"  (1.651 m), weight 133 lb 12.8 oz (60.7 kg), SpO2 98%.    Lymphatics: No cervical, supraclavicular, or axillary nodes Resp: Lungs clear bilaterally, distant breath sounds Cardio: Regular rate and rhythm GI: No hepatosplenomegaly Vascular: No leg edema  Portacath/PICC-without erythema  Lab Results:  Lab Results  Component Value Date   WBC 5.1 06/16/2023   HGB 13.7 06/16/2023   HCT 39.5 06/16/2023   MCV 92.1 06/16/2023   PLT 154 06/16/2023   NEUTROABS 3.0 06/16/2023    CMP  Lab Results  Component Value Date   NA 133 (L) 06/16/2023   K 4.3 06/16/2023   CL 98 06/16/2023   CO2 26 06/16/2023   GLUCOSE 66 (L) 06/16/2023   BUN 8 06/16/2023   CREATININE 0.96 06/16/2023   CALCIUM  10.0 06/16/2023   PROT 6.5 06/16/2023   ALBUMIN 4.0 06/16/2023   AST 32 06/16/2023   ALT 21 06/16/2023   ALKPHOS 75 06/16/2023   BILITOT 0.4 06/16/2023   GFRNONAA >60 06/16/2023    Lab Results  Component Value Date   CEA 15.17 (H) 04/29/2023     Medications: I have reviewed the patient's current medications.   Assessment/Plan: Distal esophagus cancer Esophagram 11/21/2020-moderate to severe esophageal dysmotility, narrowing of the distal esophagus just above the GE junction to 4 mm, 13 mm barium tablet stuck in the distal esophagus Upper endoscopy 12/13/2020-severe esophagitis with nodularity and stricturing at 36 cm-adenocarcinoma, HER2  negative, no loss of mismatch repair protein expression, PD-L1 combined positive score less than 1% CT chest 12/14/2020-accentuated density at the distal esophageal lumen above a small type I hiatal hernia concerning for malignancy, nonpathologically enlarged AP window and right hilar nodes PET 12/21/2020-abnormal FDG uptake at the distal esophagus, no evidence of nodal or distant metastatic disease Radiation 01/08/2021-02/16/2021 Cycle 1 weekly Taxol /carboplatin  01/08/2021 Cycle 2 weekly Taxol /carboplatin  01/15/2021 Treatment held 01/22/2021 due to neutropenia Positive COVID test 02/02/2021 Taxol /carboplatin  02/15/2021 Endoscopy 04/30/2021-shallow ulceration at the distal esophagus, nodularity at the gastric cardia-biopsies negative for malignancy CTs 04/02/2022-small mediastinal and right hilar lymph nodes.  Esophagus is mildly patulous decreased from previous in the mid to upper aspect of the mediastinum.  More distally the wall thickening of the esophagus appears slightly decreased.  Small hiatal hernia.  The area of thickening does correspond to the abnormality on PET-CT.  Emphysema. Endoscopy 04/15/2022-Short segment salmon-colored mucosa 1 cm above the Z-line.  Scarring at the gastroesophageal junction/Z-line with nonobstructing narrowing.  Nodular mucosa in the gastric cardia immediately distal to the Z-line.  Pathology on GE junction nodule-invasive moderately differentiated adenocarcinoma, tumor arises within a background of high-grade dysplasia, mild chronic active gastritis with foveolar hyperplasia, negative for squamous epithelium and intestinal metaplasia.  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  Negative for HER2 by IHC (0), PD-L1 CPS 2%. CLDN 18.2 negative, less than 5% of tumor cells with 2+ and/or 3+ membrane staining. PET scan 04/25/2022-increased radiotracer uptake associated  with the distal esophagus at the level of the GE junction.  No evidence of nodal  metastasis or distant metastatic disease. Cycle 1 FOLFOX 06/04/2022 Cycle 2 FOLFOX 06/18/2022, oxaliplatin  dose reduced secondary thrombocytopenia Cycle 3 FOLFOX 06/24/2022, oxaliplatin  dose reduced secondary thrombocytopenia, 5-FU dose reduced secondary to hand/foot syndrome 07/16/2022 chemotherapy held due to thrombocytopenia, dehydration Cycle 4 FOLFOX 07/23/2022, oxaliplatin  dose reduced due to thrombocytopenia Cycle 5 FOLFOX 08/05/2022 CT chest 08/13/2022-mild focal wall thickening at the distal esophagus, 8 mm gastropathic node-not FDG avid on PET, unchanged small mediastinal nodes no evidence of metastatic disease in the chest Upper endoscopy 08/14/2022-benign-appearing mild stenosis in the distal esophagus-microscopic focus of adenocarcinoma Cycle 6 FOLFOX 09/04/2022 Maintenance 5-fluorouracil  via pump every 2 weeks beginning 10/30/2022 5-fluorouracil  infusion 11/14/2022 5-FU  infusion 11/27/2022 5-FU infusion 12/11/2022 5-FU infusion 12/25/2022 5-FU infusion 01/08/2023 CT chest 01/16/2023-stable distal esophageal thickening, no evidence of metastatic disease Upper endoscopy 02/12/2023-localized mucosal changes characterized by nodularity and scarring in the distal esophagus (cardiac mucosa with mild chronic nonspecific carditis, negative for intestinal metaplasia or dysplasia).  Localized mild mucosal changes characterized by nodularity in the cardia (invasive moderately differentiated adenocarcinoma arising in background of high-grade dysplasia); foundation 1-tumor mutation burden 10, HRD signature negative, microsatellite stable, ERBB2 amplification.  CLDN18.2 not evaluable due to insufficient tumor cells.  PD-L1-quantity not sufficient. Cycle 1 Taxol /ramucirumab  03/04/2023 Cycle 2 held 03/18/2023 due to neutropenia Cycle 2 Taxol /ramucirumab  03/25/2023, Taxol  dose reduced CT chest 04/03/2023-esophagus with wall thickening distally.  Emphysema.  Bilateral pleural effusions.  No PE. 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  Chest CT showed bilateral pleural effusions with associated atelectatic changes.  No PE.  Persistent distal thickening in the esophagus.  Echocardiogram 04/04/2023 with LVEF 55 to 60%, normal LV function, no regional wall motion abnormalities.  Lasix  40 mg daily initiated.  Edema resolved.  Chest x-ray 04/14/2023-mild blunting of the left costophrenic angle without overt pleural effusion.    Disposition: Ms. Suri appears stable.  She is asymptomatic from the gastroesophageal cancer.  She has decided against a trial of nivolumab or chemotherapy for now.  She is scheduled for a restaging upper endoscopy in July.  She will return for an office visit during the week of 08/18/2023.  She will return for a Port-A-Cath flush in 6 weeks.  Coni Deep, MD  06/16/2023  8:38 AM

## 2023-06-17 ENCOUNTER — Encounter: Payer: Self-pay | Admitting: Oncology

## 2023-06-17 ENCOUNTER — Telehealth: Payer: Self-pay | Admitting: Oncology

## 2023-06-17 LAB — T4: T4, Total: 8.6 ug/dL (ref 4.5–12.0)

## 2023-06-17 NOTE — Telephone Encounter (Signed)
 Patient has been scheduled for follow-up visit per 06/12/23 LOS.  Pt aware of scheduled appt details.

## 2023-06-18 ENCOUNTER — Other Ambulatory Visit: Payer: Self-pay

## 2023-06-18 ENCOUNTER — Other Ambulatory Visit: Payer: Self-pay | Admitting: Family Medicine

## 2023-06-18 MED ORDER — LEVOTHYROXINE SODIUM 50 MCG PO TABS
50.0000 ug | ORAL_TABLET | Freq: Every day | ORAL | 1 refills | Status: DC
Start: 2023-06-18 — End: 2023-07-17

## 2023-06-18 NOTE — Telephone Encounter (Signed)
 TSH lab result has been posted. Please advise on levothyroxine dose refill.

## 2023-06-23 ENCOUNTER — Ambulatory Visit

## 2023-06-23 VITALS — Ht 65.0 in | Wt 133.0 lb

## 2023-06-23 DIAGNOSIS — Z780 Asymptomatic menopausal state: Secondary | ICD-10-CM

## 2023-06-23 DIAGNOSIS — Z1159 Encounter for screening for other viral diseases: Secondary | ICD-10-CM | POA: Diagnosis not present

## 2023-06-23 DIAGNOSIS — Z1211 Encounter for screening for malignant neoplasm of colon: Secondary | ICD-10-CM

## 2023-06-23 DIAGNOSIS — Z0000 Encounter for general adult medical examination without abnormal findings: Secondary | ICD-10-CM | POA: Diagnosis not present

## 2023-06-23 NOTE — Patient Instructions (Addendum)
 Alison Garner , Thank you for taking time out of your busy schedule to complete your Annual Wellness Visit with me. I enjoyed our conversation and look forward to speaking with you again next year. I, as well as your care team,  appreciate your ongoing commitment to your health goals. Please review the following plan we discussed and let me know if I can assist you in the future. Your Game plan/ To Do List    Referrals: If you haven't heard from the office you've been referred to, please reach out to them at the phone provided.  Ordered a DEXA Scan, a Screening Colonoscopy, and a Hepatitis C Screening (lab).  Patient plans to complete tests after treatments. Follow up Visits: Next Medicare AWV with our clinical staff: 06/23/2024   Have you seen your provider in the last 6 months (3 months if uncontrolled diabetes)? No - Surveyor, quantity Next Office Visit with your provider: None due to seeing Oncologist for Treatment  Clinician Recommendations:  Aim for 30 minutes of exercise or brisk walking, 6-8 glasses of water, and 5 servings of fruits and vegetables each day. Educated and advised on getting a Tdap (Tetenus), Pneumonia, COVID, and Shingles vaccines in 2025.        This is a list of the screening recommended for you and due dates:  Health Maintenance  Topic Date Due   Hepatitis C Screening  Never done   Pneumonia Vaccine (1 of 2 - PCV) 02/12/1969   Zoster (Shingles) Vaccine (1 of 2) Never done   DEXA scan (bone density measurement)  Never done   Colon Cancer Screening  09/20/2016   COVID-19 Vaccine (3 - Moderna risk series) 06/08/2019   DTaP/Tdap/Td vaccine (3 - Td or Tdap) 09/05/2021   Flu Shot  09/05/2023   Screening for Lung Cancer  04/02/2024   Medicare Annual Wellness Visit  06/22/2024   HPV Vaccine  Aged Out   Meningitis B Vaccine  Aged Out    Advanced directives: (In Chart) A copy of your advanced directives are scanned into your chart should your provider ever need it. Advance  Care Planning is important because it:  [x]  Makes sure you receive the medical care that is consistent with your values, goals, and preferences  [x]  It provides guidance to your family and loved ones and reduces their decisional burden about whether or not they are making the right decisions based on your wishes.  Follow the link provided in your after visit summary or read over the paperwork we have mailed to you to help you started getting your Advance Directives in place. If you need assistance in completing these, please reach out to us  so that we can help you!

## 2023-06-23 NOTE — Progress Notes (Addendum)
 Subjective:  Please attest and cosign this visit due to patients primary care provider not being in the office at the time the visit was completed.  (Pt of Alison Back, NP)   Alison Garner is a 73 y.o. who presents for a Medicare Wellness preventive visit.  As a reminder, Annual Wellness Visits don't include a physical exam, and some assessments may be limited, especially if this visit is performed virtually. We may recommend an in-person follow-up visit with your provider if needed.  Visit Complete: Virtual I connected with  [name] on 06/23/23 by a audio enabled telemedicine application and verified that I am speaking with the correct person using two identifiers.  Patient Location: Home  Provider Location: Office/Clinic  I discussed the limitations of evaluation and management by telemedicine. The patient expressed understanding and agreed to proceed.  Vital Signs: Because this visit was a virtual/telehealth visit, some criteria may be missing or patient reported. Any vitals not documented were not able to be obtained and vitals that have been documented are patient reported.  VideoDeclined- This patient declined Librarian, academic. Therefore the visit was completed with audio only.  Persons Participating in Visit: Patient.  AWV Questionnaire: Yes: Patient Medicare AWV questionnaire was completed by the patient on 06/20/2023; I have confirmed that all information answered by patient is correct and no changes since this date.  Cardiac Risk Factors include: advanced age (>4men, >12 women);smoking/ tobacco exposure (current smoker)     Objective:     Today's Vitals   06/23/23 1111  Weight: 133 lb (60.3 kg)  Height: 5\' 5"  (1.651 m)   Body mass index is 22.13 kg/m.     06/23/2023   11:18 AM 06/16/2023    8:14 AM 05/08/2023    1:12 PM 04/29/2023   11:44 AM 04/15/2023   11:12 AM 04/08/2023   10:36 AM 03/18/2023    9:13 AM  Advanced  Directives  Does Patient Have a Medical Advance Directive? Yes Yes No No Yes Yes Yes  Type of Estate agent of [place];Living will Living will  Living will;Healthcare Power of [employer] Power of [place];Living will  Healthcare Power of [place];Living will  Does patient want to make changes to medical advance directive? No - Patient declined No - Patient declined  No - Patient declined No - Patient declined No - Patient declined No - Patient declined  Copy of Healthcare Power of Attorney in Chart? Yes - validated most recent copy scanned in chart (See row information)        Would patient like information on creating a medical advance directive? No - Patient declined No - Patient declined No - Patient declined No - Patient declined No - Patient declined No - Patient declined No - Patient declined    Current Medications (verified) Outpatient Encounter Medications as of 06/23/2023  Medication Sig   Cholecalciferol (VITAMIN D3) 50 MCG (2000 UT) CAPS Take 2,000 Units by mouth 2 (two) times daily.   ezetimibe-simvastatin (VYTORIN) 10-20 MG tablet Take 1 tablet by mouth every other day.   imipramine  (TOFRANIL ) 25 MG tablet Take 2 tablets by mouth at bedtime.   levothyroxine  (SYNTHROID ) 50 MCG tablet Take 1 tablet (50 mcg total) by mouth daily before breakfast.   pantoprazole  (PROTONIX ) 40 MG tablet Take 1 tablet (40 mg total) by mouth 2 (two) times daily before a meal.   potassium chloride  SA (KLOR-CON  M) 20 MEQ tablet Take 1 tablet (20 mEq total) by  mouth daily.   amLODipine  (NORVASC ) 2.5 MG tablet TAKE 1 TABLET BY MOUTH EVERY DAY (Patient not taking: Reported on 06/23/2023)   furosemide  (LASIX ) 20 MG tablet Take 1 tablet (20 mg total) by mouth daily for 3 days. (Patient not taking: Reported on 05/14/2023)   ondansetron  (ZOFRAN ) 8 MG tablet Take 1 tablet (8 mg total) by mouth every 8 (eight) hours as needed for nausea or vomiting (May use day 3 after chemo as needed for  nausea). (Patient not taking: Reported on 06/23/2023)   prochlorperazine  (COMPAZINE ) 10 MG tablet Take 1 tablet (10 mg total) by mouth every 6 (six) hours as needed for nausea or vomiting. (Patient not taking: Reported on 06/23/2023)   [DISCONTINUED] potassium chloride  SA (KLOR-CON  M) 20 MEQ tablet Take 1 tablet (20 mEq total) by mouth daily.   Facility-Administered Encounter Medications as of 06/23/2023  Medication   sodium chloride  flush (NS) 0.9 % injection 10 mL   sodium chloride  flush (NS) 0.9 % injection 10 mL    Allergies (verified) Bactrim [sulfamethoxazole-trimethoprim]   History: Past Medical History:  Diagnosis Date   Allergy    Bactrim   Arthritis    Carotid bruit    Diverticulosis    Emphysema of lung (HCC) 2022   by CT only   Esophageal cancer (HCC)    External hemorrhoids    GERD (gastroesophageal reflux disease) 2022   Esophagus cancer   Hiatal hernia    History of radiation therapy    Esophagus- 01/08/21-02/16/21- Dr. Retta Caster   Hyperlipidemia    Hypothyroidism    Internal hemorrhoids    Irritable bowel syndrome    Kidney infection    UTI hx   Kidney stones    Lichen planus    Macular cyst, hole, or pseudohole, unspecified eye    Thyroid  disease    thyroid  nodule   Vitamin D deficiency    Past Surgical History:  Procedure Laterality Date   COLONOSCOPY  2017   JMP-MAC-good prep-tics/hems/normal colon   DILATION AND CURETTAGE OF UTERUS  1982   IR IMAGING GUIDED PORT INSERTION  05/17/2022   UPPER GASTROINTESTINAL ENDOSCOPY  12/2020   Family History  Problem Relation Age of Onset   Heart disease Mother    Hypertension Mother    Arthritis Mother    Stroke Mother    Heart disease Father    Heart failure Father    Thyroid  disease Brother    Hypertension Brother    Heart disease Maternal Aunt    Arthritis Maternal Aunt    Other Maternal Aunt        MDS   Heart disease Maternal Uncle    Heart failure Maternal Uncle    Varicose Veins Maternal  Uncle    Myasthenia gravis Paternal Aunt    Heart disease Paternal Uncle    Heart failure Paternal Uncle    COPD Paternal Uncle    Diabetes Maternal Grandmother    Cancer Paternal Grandfather    Colon cancer Neg Hx    Esophageal cancer Neg Hx    Stomach cancer Neg Hx    Rectal cancer Neg Hx    Colon polyps Neg Hx    Social History   Socioeconomic History   Marital status: Single    Spouse name: Not on file   Number of children: Not on file   Years of education: Not on file   Highest education level: Bachelor's degree (e.g., BA, AB, BS)  Occupational History   Not on file  Tobacco Use   Smoking status: Every Day    Current packs/day: 0.50    Average packs/day: 0.5 packs/day for 50.0 years (25.0 ttl pk-yrs)    Types: Cigarettes    Passive exposure: Current   Smokeless tobacco: Never  Vaping Use   Vaping status: Never Used  Substance and Sexual Activity   Alcohol use: Yes    Alcohol/week: 2.0 standard drinks of alcohol    Types: 1 Glasses of wine, 1 Standard drinks or equivalent per week    Comment: Maybe yearly on vacation   Drug use: No   Sexual activity: Not Currently    Birth control/protection: Post-menopausal  Other Topics Concern   Not on file  Social History Narrative   Not on file   Social Drivers of Health   Financial Resource Strain: Low Risk  (06/23/2023)   Overall Financial Resource Strain (CARDIA)    Difficulty of Paying Living Expenses: Not hard at all  Food Insecurity: No Food Insecurity (06/23/2023)   Hunger Vital Sign    Worried About Running Out of Food in the Last Year: Never true    Ran Out of Food in the Last Year: Never true  Transportation Needs: No Transportation Needs (06/23/2023)   PRAPARE - Administrator, Civil Service (Medical): No    Lack of Transportation (Non-Medical): No  Physical Activity: Inactive (06/23/2023)   Exercise Vital Sign    Days of Exercise per Week: 0 days    Minutes of Exercise per Session: 0 min   Stress: No Stress Concern Present (06/23/2023)   [employer] of Occupational Health - Occupational Stress Questionnaire    Feeling of Stress : Not at all  Social Connections: Moderately Isolated (06/23/2023)   Social Connection and Isolation Panel [NHANES]    Frequency of Communication with Friends and Family: More than three times a week    Frequency of Social Gatherings with Friends and Family: More than three times a week    Attends Religious Services: More than 4 times per year    Active Member of [employer] or Organizations: No    Attends Engineer, structural: Not on file    Marital Status: Divorced    Tobacco Counseling Ready to quit: No Counseling given: Yes    Clinical Intake:  Pre-visit preparation completed: Yes  Pain : No/denies pain     BMI - recorded: 22.13 Nutritional Status: BMI of 19-24  Normal Nutritional Risks: None Diabetes: No  Lab Results  Component Value Date   HGBA1C 5.5 04/29/2023     How often do you need to have someone help you when you read instructions, pamphlets, or other written materials from your doctor or pharmacy?: 1 - Never  Interpreter Needed?: No  Information entered by :: Kandy Orris, CMA   Activities of Daily Living     06/23/2023   11:24 AM 06/20/2023   12:46 PM  In your present state of health, do you have any difficulty performing the following activities:  Hearing? 0 0  Vision? 0 0  Difficulty concentrating or making decisions? 1 1  Walking or climbing stairs? 1 1  Dressing or bathing? 0 0  Doing errands, shopping? 0 0  Preparing Food and eating ? N N  Using the Toilet? N N  In the past six months, have you accidently leaked urine? Colie Dawes  Comment wears a pad   Do you have problems with loss of bowel control? Colie Dawes  Comment wears a pad  Managing your Medications? N N  Managing your Finances? N N  Housekeeping or managing your Housekeeping? Colie Dawes    Patient Care Team: Abram Abraham, NP-C as PCP -  General (Family Medicine) Lutricia Salts Jordis Nevins, RN as Oncology Nurse Navigator Sumner Ends, MD as Consulting Physician (Oncology) Hershell Lose, OD (Optometry)  Indicate any recent Medical Services you may have received from other than [facility] providers in the past year (date may be approximate).     Assessment:    This is a routine wellness examination for Sabrina.  Hearing/Vision screen Hearing Screening - Comments:: Denies hearing difficulties Vision Screening - Comments:: Wears rx glasses - up to date with routine eye exams with Dr Hershell Lose    Goals Addressed               This Visit's Progress     Patient Stated (pt-stated)        Patient stated she plans to continue to eat healthier foods and started Garner walking more       Depression Screen     06/23/2023   11:28 AM 05/14/2023    2:38 PM 04/16/2023    2:37 PM  PHQ 2/9 Scores  PHQ - 2 Score 0 0 0  PHQ- 9 Score 5      Fall Risk     06/23/2023   11:27 AM 06/20/2023   12:46 PM 04/16/2023    2:37 PM  Fall Risk   Falls in the past year? 1 1 0  Number falls in past yr: 0 1 0  Comment 2    Injury with Fall? 1 1 0  Comment fractured fibula    Risk for fall due to : --  No Fall Risks  Risk for fall due to: Comment underwent chemo & pt states caused 1 fall due to dehydration    Follow up Falls evaluation completed;Falls prevention discussed  Falls evaluation completed    MEDICARE RISK AT HOME:  Medicare Risk at Home Any stairs in or around the home?: No If so, are there any without handrails?: No Home free of loose throw rugs in walkways, pet beds, electrical cords, etc?: Yes Adequate lighting in your home to reduce risk of falls?: Yes Life alert?: No Use of a cane, walker or w/c?: No Grab bars in the bathroom?: Yes Shower chair or bench in shower?: No Elevated toilet seat or a handicapped toilet?: No  TIMED UP AND GO:  Was the test performed?  No  Cognitive Function: 6CIT completed        06/23/2023    11:32 AM  6CIT Screen  What Year? 0 points  What month? 0 points  What time? 0 points  Count Garner from 20 0 points  Months in reverse 0 points  Repeat phrase 0 points  Total Score 0 points    Immunizations Immunization History  Administered Date(s) Administered   Influenza Whole 11/29/2003   Moderna Sars-Covid-2 Vaccination 04/08/2019, 05/11/2019   Pneumococcal Conjugate PCV 7 11/29/2003   Td 11/29/2003   Tdap 09/06/2011    Screening Tests Health Maintenance  Topic Date Due   Hepatitis C Screening  Never done   Pneumonia Vaccine 11+ Years old (1 of 2 - PCV) [birth year]1967   Zoster Vaccines- Shingrix (1 of 2) Never done   DEXA SCAN  Never done   Colonoscopy  09/20/2016   COVID-19 Vaccine (3 - Moderna risk series) 06/08/2019   DTaP/Tdap/Td (3 - Td or Tdap) 09/05/2021  INFLUENZA VACCINE  09/05/2023   Lung Cancer Screening  04/02/2024   Medicare Annual Wellness (AWV)  06/22/2024   HPV VACCINES  Aged Out   Meningococcal B Vaccine  Aged Out    Health Maintenance  Health Maintenance Due  Topic Date Due   Hepatitis C Screening  Never done   Pneumonia Vaccine 42+ Years old (1 of 2 - PCV) 02[birth year]1967   Zoster Vaccines- Shingrix (1 of 2) Never done   DEXA SCAN  Never done   Colonoscopy  09/20/2016   COVID-19 Vaccine (3 - Moderna risk series) 06/08/2019   DTaP/Tdap/Td (3 - Td or Tdap) 09/05/2021   Health Maintenance Items Addressed:   DEXA ordered, Hepatitis C Screening, and a Screening Colonoscopy ordered today.  Patient plans to complete tests after treatments.  Additional Screening:  Vision Screening: Recommended annual ophthalmology exams for early detection of glaucoma and other disorders of the eye.  Dental Screening: Recommended annual dental exams for proper oral hygiene  Community Resource Referral / Chronic Care Management: CRR required this visit?  No   CCM required this visit?  No   Plan:    I have personally reviewed and noted the following in the  patient's chart:   Medical and social history Use of alcohol, tobacco or illicit drugs  Current medications and supplements including opioid prescriptions. Patient is not currently taking opioid prescriptions. Functional ability and status Nutritional status Physical activity Advanced directives List of other physicians Hospitalizations, surgeries, and ER visits in previous 12 months Vitals Screenings to include cognitive, depression, and falls Referrals and appointments  In addition, I have reviewed and discussed with patient certain preventive protocols, quality metrics, and best practice recommendations. A written personalized care plan for preventive services as well as general preventive health recommendations were provided to patient.   Patria Bookbinder, CMA   06/23/2023   After Visit Summary: (MyChart) Due to this being a telephonic visit, the after visit summary with patients personalized plan was offered to patient via MyChart   Notes: Pt stated is currently taking chemo tx (currently on break as of 04/15/2023 but plans to resume more txs).

## 2023-06-24 ENCOUNTER — Other Ambulatory Visit: Payer: Self-pay

## 2023-07-01 ENCOUNTER — Encounter: Payer: Self-pay | Admitting: Family Medicine

## 2023-07-01 MED ORDER — EZETIMIBE-SIMVASTATIN 10-20 MG PO TABS: 1.0000 | ORAL_TABLET | ORAL | 0 refills | Status: AC

## 2023-07-10 ENCOUNTER — Other Ambulatory Visit: Payer: Self-pay | Admitting: Internal Medicine

## 2023-07-15 ENCOUNTER — Ambulatory Visit (INDEPENDENT_AMBULATORY_CARE_PROVIDER_SITE_OTHER)
Admission: RE | Admit: 2023-07-15 | Discharge: 2023-07-15 | Disposition: A | Discharge status: Home / Self Care | Source: Ambulatory Visit | Attending: Family Medicine | Admitting: Family Medicine

## 2023-07-15 DIAGNOSIS — Z780 Asymptomatic menopausal state: Secondary | ICD-10-CM | POA: Diagnosis not present

## 2023-07-16 ENCOUNTER — Other Ambulatory Visit: Payer: Self-pay | Admitting: Family Medicine

## 2023-07-23 ENCOUNTER — Ambulatory Visit: Payer: Self-pay | Admitting: Family Medicine

## 2023-07-23 NOTE — Progress Notes (Signed)
 Needs ov to discuss DEXA results and treatment options. Low bone density with osteoporosis at one of the locations tested.

## 2023-07-28 ENCOUNTER — Ambulatory Visit (AMBULATORY_SURGERY_CENTER)

## 2023-07-28 ENCOUNTER — Inpatient Hospital Stay: Attending: Oncology

## 2023-07-28 VITALS — Ht 65.0 in | Wt 133.0 lb

## 2023-07-28 DIAGNOSIS — C155 Malignant neoplasm of lower third of esophagus: Secondary | ICD-10-CM | POA: Insufficient documentation

## 2023-07-28 DIAGNOSIS — Z8501 Personal history of malignant neoplasm of esophagus: Secondary | ICD-10-CM

## 2023-07-28 NOTE — Progress Notes (Signed)
 No egg or soy allergy known to patient  No issues known to pt with past sedation with any surgeries or procedures Patient denies ever being told they had issues or difficulty with intubation  No FH of Malignant Hyperthermia Pt is not on diet pills Pt is not on  home 02  Pt is not on blood thinners  No A fib or A flutter Have any cardiac testing pending-- no  LOA: independent    Patient's chart reviewed by Rogena Class CNRA prior to previsit and patient appropriate for the [facility].  Previsit completed and red dot placed by patient's name on their procedure day (on provider's schedule).     PV completed with patient. Prep instructions sent via mychart and home address.

## 2023-08-01 ENCOUNTER — Ambulatory Visit: Payer: Self-pay | Admitting: Family Medicine

## 2023-08-01 ENCOUNTER — Encounter: Payer: Self-pay | Admitting: Family Medicine

## 2023-08-01 ENCOUNTER — Ambulatory Visit (INDEPENDENT_AMBULATORY_CARE_PROVIDER_SITE_OTHER): Admitting: Family Medicine

## 2023-08-01 VITALS — BP 112/80 | HR 87 | Temp 97.6°F | Ht 65.0 in | Wt 134.0 lb

## 2023-08-01 DIAGNOSIS — E039 Hypothyroidism, unspecified: Secondary | ICD-10-CM

## 2023-08-01 DIAGNOSIS — Z8781 Personal history of (healed) traumatic fracture: Secondary | ICD-10-CM | POA: Diagnosis not present

## 2023-08-01 DIAGNOSIS — C155 Malignant neoplasm of lower third of esophagus: Secondary | ICD-10-CM

## 2023-08-01 DIAGNOSIS — M8580 Other specified disorders of bone density and structure, unspecified site: Secondary | ICD-10-CM | POA: Insufficient documentation

## 2023-08-01 DIAGNOSIS — Z1159 Encounter for screening for other viral diseases: Secondary | ICD-10-CM

## 2023-08-01 DIAGNOSIS — Z720 Tobacco use: Secondary | ICD-10-CM

## 2023-08-01 LAB — TSH: TSH: 2.29 u[IU]/mL (ref 0.35–5.50)

## 2023-08-01 NOTE — Addendum Note (Signed)
 Addended by: Madellyn Denio L on: 08/01/2023 03:16 PM   Modules accepted: Orders

## 2023-08-01 NOTE — Progress Notes (Addendum)
 Subjective:     Patient ID: Alison Garner, female    DOB: [birth year], 73 y.o.   MRN: [id number]  Chief Complaint  Patient presents with   Medical Management of Chronic Issues    Discuss DEXA results and increase in levothyroxine  dosage    HPI   History of Present Illness         She is here to follow up on abnormal DEXA findings and TSH.   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  Hx of fracture   She has previous DEXA from August 2022 which was normal.   Stopped treatments for esophageal cancer in March 2025 due to side effects.   She has an upper EGD in July to re-evaluate for cancer and treatment results.  Currently asymptomatic   Levothyroxine  was increased to 50 mcg in May. Needs recheck   Health Maintenance Due  Topic Date Due   Hepatitis C Screening  Never done   Pneumococcal Vaccine: 50+ Years (1 of 2 - PCV) 02/[birth year]/1967   Zoster Vaccines- Shingrix (1 of 2) Never done   Colonoscopy  09/20/2016   COVID-19 Vaccine (3 - Moderna risk series) 06/08/2019   DTaP/Tdap/Td (3 - Td or Tdap) 09/05/2021    Past Medical History:  Diagnosis Date   Allergy    Bactrim   Arthritis    Carotid bruit    Diverticulosis    Emphysema of lung (HCC) 2022   by CT only   Esophageal cancer (HCC)    External hemorrhoids    GERD (gastroesophageal reflux disease) 2022   Esophagus cancer   Hiatal hernia    History of radiation therapy    Esophagus- 01/08/21-02/16/21- Dr. Lynwood Nasuti   Hyperlipidemia    Hypothyroidism    Internal hemorrhoids    Irritable bowel syndrome    Kidney infection    UTI hx   Kidney stones    Lichen planus    Macular cyst, hole, or pseudohole, unspecified eye    Thyroid  disease    thyroid  nodule   Vitamin D deficiency     Past Surgical History:  Procedure Laterality Date   COLONOSCOPY  2017   JMP-MAC-good prep-tics/hems/normal colon    DILATION AND CURETTAGE OF UTERUS  1982   IR IMAGING GUIDED PORT INSERTION  05/17/2022   UPPER GASTROINTESTINAL ENDOSCOPY  12/2020    Family History  Problem Relation Age of Onset   Heart disease Mother    Hypertension Mother    Arthritis Mother    Stroke Mother    Heart disease Father    Heart failure Father    Thyroid  disease Brother    Hypertension Brother    Heart disease Maternal Aunt    Arthritis Maternal Aunt    Other Maternal Aunt        MDS   Heart disease Maternal Uncle    Heart failure Maternal Uncle    Varicose Veins Maternal Uncle    Myasthenia gravis Paternal Aunt    Heart disease Paternal Uncle    Heart failure Paternal Uncle    COPD Paternal Uncle    Diabetes Maternal Grandmother    Cancer Paternal Grandfather    Colon cancer Neg Hx    Esophageal cancer Neg Hx    Stomach cancer Neg Hx    Rectal cancer Neg Hx    Colon polyps Neg Hx     Social History  Socioeconomic History   Marital status: Single    Spouse name: Not on file   Number of children: Not on file   Years of education: Not on file   Highest education level: Bachelor's degree (e.g., BA, AB, BS)  Occupational History   Not on file  Tobacco Use   Smoking status: Every Day    Current packs/day: 0.50    Average packs/day: 0.5 packs/day for 50.0 years (25.0 ttl pk-yrs)    Types: Cigarettes    Passive exposure: Current   Smokeless tobacco: Never  Vaping Use   Vaping status: Never Used  Substance and Sexual Activity   Alcohol use: Yes    Alcohol/week: 2.0 standard drinks of alcohol    Types: 1 Glasses of wine, 1 Standard drinks or equivalent per week    Comment: Maybe yearly on vacation   Drug use: No   Sexual activity: Not Currently    Birth control/protection: Post-menopausal  Other Topics Concern   Not on file  Social History Narrative   Not on file   Social Drivers of Health   Financial Resource Strain: Low Risk  (07/31/2023)   Overall Financial Resource Strain (CARDIA)     Difficulty of Paying Living Expenses: Not hard at all  Food Insecurity: No Food Insecurity (07/31/2023)   Hunger Vital Sign    Worried About Running Out of Food in the Last Year: Never true    Ran Out of Food in the Last Year: Never true  Transportation Needs: No Transportation Needs (07/31/2023)   PRAPARE - Administrator, Civil Service (Medical): No    Lack of Transportation (Non-Medical): No  Physical Activity: Inactive (07/31/2023)   Exercise Vital Sign    Days of Exercise per Week: 0 days    Minutes of Exercise per Session: Not on file  Stress: No Stress Concern Present (07/31/2023)   [employer] of Occupational Health - Occupational Stress Questionnaire    Feeling of Stress: Not at all  Social Connections: Moderately Isolated (07/31/2023)   Social Connection and Isolation Panel    Frequency of Communication with Friends and Family: More than three times a week    Frequency of Social Gatherings with Friends and Family: Twice a week    Attends Religious Services: More than 4 times per year    Active Member of [employer] or Organizations: No    Attends Engineer, structural: Not on file    Marital Status: Divorced  Intimate Partner Violence: Not At Risk (06/23/2023)   Humiliation, Afraid, Rape, and Kick questionnaire    Fear of Current or Ex-Partner: No    Emotionally Abused: No    Physically Abused: No    Sexually Abused: No    Outpatient Medications Prior to Visit  Medication Sig Dispense Refill   Cholecalciferol (VITAMIN D3) 50 MCG (2000 UT) CAPS Take 2,000 Units by mouth 2 (two) times daily.     ezetimibe -simvastatin  (VYTORIN ) 10-20 MG tablet Take 1 tablet by mouth every other day. 90 tablet 0   imipramine  (TOFRANIL ) 25 MG tablet Take 2 tablets by mouth at bedtime. 180 tablet 0   levothyroxine  (SYNTHROID ) 50 MCG tablet TAKE 1 TABLET BY MOUTH DAILY BEFORE BREAKFAST 90 tablet 0   pantoprazole  (PROTONIX ) 40 MG tablet TAKE 1 TABLET (40 MG TOTAL) BY MOUTH TWICE A  DAY BEFORE MEALS 180 tablet 2   potassium chloride  SA (KLOR-CON  M) 20 MEQ tablet Take 1 tablet (20 mEq total) by mouth daily. (Patient taking differently: Take  20 mEq by mouth once a week.) 90 tablet 0   Facility-Administered Medications Prior to Visit  Medication Dose Route Frequency Provider Last Rate Last Admin   sodium chloride  flush (NS) 0.9 % injection 10 mL  10 mL Intravenous PRN Sherrill, Gary B, MD   10 mL at 03/18/23 0949   sodium chloride  flush (NS) 0.9 % injection 10 mL  10 mL Intravenous PRN Cloretta Arley NOVAK, MD   10 mL at 04/08/23 1130    Allergies  Allergen Reactions   Bactrim [Sulfamethoxazole-Trimethoprim] Rash    Review of Systems  Constitutional:  Negative for chills, fever and malaise/fatigue.  Respiratory:  Negative for shortness of breath.   Cardiovascular:  Negative for chest pain, palpitations and leg swelling.  Gastrointestinal:  Negative for abdominal pain, constipation, diarrhea, nausea and vomiting.  Neurological:  Negative for dizziness, focal weakness and headaches.  Psychiatric/Behavioral:  Negative for depression. The patient is not nervous/anxious.        Objective:    Physical Exam Constitutional:      General: She is not in acute distress.    Appearance: She is not ill-appearing.   Eyes:     Extraocular Movements: Extraocular movements intact.     Conjunctiva/sclera: Conjunctivae normal.    Cardiovascular:     Rate and Rhythm: Normal rate.  Pulmonary:     Effort: Pulmonary effort is normal.   Musculoskeletal:     Cervical back: Normal range of motion and neck supple.   Skin:    General: Skin is warm and dry.   Neurological:     General: No focal deficit present.     Mental Status: She is alert and oriented to person, place, and time.   Psychiatric:        Mood and Affect: Mood normal.        Behavior: Behavior normal.        Thought Content: Thought content normal.      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 Check TSH and adjust levothyroxine  dose as warranted.  She will check with her oncologist, Dr. Cloretta, regarding treatment for osteopenia/osteoporosis of the ultradistal radius with several risk factors for worsening bone health.  For now she would like to hold off on treatment. Upcoming EGD for evaluation of esophageal cancer and treatment success.  I am having Jahnasia Tatum. Balke maintain her Vitamin D3, potassium chloride  SA, imipramine , ezetimibe -simvastatin , pantoprazole , and levothyroxine .  No orders of the defined types were placed in  this encounter.

## 2023-08-01 NOTE — Patient Instructions (Addendum)
 Please go downstairs for labs.  Discuss with Dr. Cloretta regarding your recent bone density results and whether or not you would like to initiate treatment.  You would not be a candidate for oral medications.  I would recommend Prolia or Evenity injections.Alison Garner

## 2023-08-01 NOTE — Addendum Note (Signed)
 Addended by: Rilan Eiland E on: 08/01/2023 03:16 PM   Modules accepted: Orders

## 2023-08-02 LAB — HEPATITIS C ANTIBODY: Hepatitis C Ab: NONREACTIVE

## 2023-08-09 ENCOUNTER — Encounter: Payer: Self-pay | Admitting: Internal Medicine

## 2023-08-13 ENCOUNTER — Encounter: Payer: Self-pay | Admitting: Internal Medicine

## 2023-08-13 ENCOUNTER — Ambulatory Visit: Admitting: Internal Medicine

## 2023-08-13 VITALS — BP 189/91 | HR 76 | Temp 97.2°F | Resp 20

## 2023-08-13 DIAGNOSIS — C160 Malignant neoplasm of cardia: Secondary | ICD-10-CM

## 2023-08-13 DIAGNOSIS — K222 Esophageal obstruction: Secondary | ICD-10-CM

## 2023-08-13 DIAGNOSIS — K449 Diaphragmatic hernia without obstruction or gangrene: Secondary | ICD-10-CM

## 2023-08-13 DIAGNOSIS — C159 Malignant neoplasm of esophagus, unspecified: Secondary | ICD-10-CM

## 2023-08-13 DIAGNOSIS — Z8501 Personal history of malignant neoplasm of esophagus: Secondary | ICD-10-CM

## 2023-08-13 MED ORDER — SODIUM CHLORIDE 0.9 % IV SOLN: 500.0000 mL | Freq: Once | INTRAVENOUS | Status: DC

## 2023-08-13 NOTE — Progress Notes (Signed)
 1450 BP 189/94, Labetalol given IV.  Pt experienced losa with jaw thrust  performed. vss

## 2023-08-13 NOTE — Progress Notes (Signed)
 GASTROENTEROLOGY PROCEDURE H&P NOTE   Primary Care Physician: Lendia Boby CROME, NP-C    Reason for Procedure:   Esophageal cancer  Plan:    EGD  Patient is appropriate for endoscopic procedure(s) in the ambulatory ([facility]) setting.  The nature of the procedure, as well as the risks, benefits, and alternatives were carefully and thoroughly reviewed with the patient. Ample time for discussion and questions allowed. The patient understood, was satisfied, and agreed to proceed.     HPI: Alison Garner is a 73 y.o. female who presents for EGD.  Medical history as below.    No recent chest pain or shortness of breath.  No abdominal pain today.  Past Medical History:  Diagnosis Date   Allergy    Bactrim   Arthritis    Carotid bruit    COPD (chronic obstructive pulmonary disease) (HCC)    Diverticulosis    Emphysema of lung (HCC) 2022   by CT only   Esophageal cancer (HCC)    External hemorrhoids    GERD (gastroesophageal reflux disease) 2022   Esophagus cancer   Hiatal hernia    History of radiation therapy    Esophagus- 01/08/21-02/16/21- Dr. Lynwood Nasuti   Hyperlipidemia    Hypothyroidism    Internal hemorrhoids    Irritable bowel syndrome    Kidney infection    UTI hx   Kidney stones    Lichen planus    Macular cyst, hole, or pseudohole, unspecified eye    Thyroid  disease    thyroid  nodule   Vitamin D deficiency     Past Surgical History:  Procedure Laterality Date   COLONOSCOPY  2017   JMP-MAC-good prep-tics/hems/normal colon   DILATION AND CURETTAGE OF UTERUS  1982   IR IMAGING GUIDED PORT INSERTION  05/17/2022   UPPER GASTROINTESTINAL ENDOSCOPY  12/2020    Prior to Admission medications   Medication Sig Start Date End Date Taking? Authorizing Provider  Cholecalciferol (VITAMIN D3) 50 MCG (2000 UT) CAPS Take 2,000 Units by mouth 2 (two) times daily.   Yes [provider]  ezetimibe -simvastatin  (VYTORIN ) 10-20 MG tablet Take 1 tablet by mouth  every other day. 07/01/23  Yes Henson, Vickie L, NP-C  imipramine  (TOFRANIL ) 25 MG tablet Take 2 tablets by mouth at bedtime. 06/10/23  Yes Henson, Vickie L, NP-C  levothyroxine  (SYNTHROID ) 50 MCG tablet TAKE 1 TABLET BY MOUTH DAILY BEFORE BREAKFAST 07/17/23  Yes Henson, Vickie L, NP-C  pantoprazole  (PROTONIX ) 40 MG tablet TAKE 1 TABLET (40 MG TOTAL) BY MOUTH TWICE A DAY BEFORE MEALS 07/10/23  Yes Betta Balla, Gordy HERO, MD  potassium chloride  SA (KLOR-CON  M) 20 MEQ tablet Take 1 tablet (20 mEq total) by mouth daily. Patient taking differently: Take 20 mEq by mouth once a week. 12/11/22  Yes Cloretta Arley NOVAK, MD  potassium chloride  SA (KLOR-CON  M) 20 MEQ tablet Take 1 tablet (20 mEq total) by mouth daily. 06/04/22   Debby Olam POUR, NP    Current Outpatient Medications  Medication Sig Dispense Refill   Cholecalciferol (VITAMIN D3) 50 MCG (2000 UT) CAPS Take 2,000 Units by mouth 2 (two) times daily.     ezetimibe -simvastatin  (VYTORIN ) 10-20 MG tablet Take 1 tablet by mouth every other day. 90 tablet 0   imipramine  (TOFRANIL ) 25 MG tablet Take 2 tablets by mouth at bedtime. 180 tablet 0   levothyroxine  (SYNTHROID ) 50 MCG tablet TAKE 1 TABLET BY MOUTH DAILY BEFORE BREAKFAST 90 tablet 0   pantoprazole  (PROTONIX ) 40 MG tablet TAKE  1 TABLET (40 MG TOTAL) BY MOUTH TWICE A DAY BEFORE MEALS 180 tablet 2   potassium chloride  SA (KLOR-CON  M) 20 MEQ tablet Take 1 tablet (20 mEq total) by mouth daily. (Patient taking differently: Take 20 mEq by mouth once a week.) 90 tablet 0   Current Facility-Administered Medications  Medication Dose Route Frequency Provider Last Rate Last Admin   0.9 %  sodium chloride  infusion  500 mL Intravenous Once Danilynn Jemison, Gordy HERO, MD       Facility-Administered Medications Ordered in Other Visits  Medication Dose Route Frequency Provider Last Rate Last Admin   sodium chloride  flush (NS) 0.9 % injection 10 mL  10 mL Intravenous PRN Cloretta Arley NOVAK, MD   10 mL at 03/18/23 0949   sodium chloride  flush  (NS) 0.9 % injection 10 mL  10 mL Intravenous PRN Cloretta Arley NOVAK, MD   10 mL at 04/08/23 1130    Allergies as of 08/13/2023 - Review Complete 08/13/2023  Allergen Reaction Noted   Bactrim [sulfamethoxazole-trimethoprim] Rash 09/07/2015    Family History  Problem Relation Age of Onset   Heart disease Mother    Hypertension Mother    Arthritis Mother    Stroke Mother    Heart disease Father    Heart failure Father    Thyroid  disease Brother    Hypertension Brother    Heart disease Maternal Aunt    Arthritis Maternal Aunt    Other Maternal Aunt        MDS   Heart disease Maternal Uncle    Heart failure Maternal Uncle    Varicose Veins Maternal Uncle    Myasthenia gravis Paternal Aunt    Heart disease Paternal Uncle    Heart failure Paternal Uncle    COPD Paternal Uncle    Diabetes Maternal Grandmother    Cancer Paternal Grandfather    Colon cancer Neg Hx    Esophageal cancer Neg Hx    Stomach cancer Neg Hx    Rectal cancer Neg Hx    Colon polyps Neg Hx     Social History   Socioeconomic History   Marital status: Single    Spouse name: Not on file   Number of children: Not on file   Years of education: Not on file   Highest education level: Bachelor's degree (e.g., BA, AB, BS)  Occupational History   Not on file  Tobacco Use   Smoking status: Every Day    Current packs/day: 0.50    Average packs/day: 0.5 packs/day for 50.0 years (25.0 ttl pk-yrs)    Types: Cigarettes    Passive exposure: Current   Smokeless tobacco: Never  Vaping Use   Vaping status: Never Used  Substance and Sexual Activity   Alcohol use: Yes    Alcohol/week: 2.0 standard drinks of alcohol    Types: 1 Glasses of wine, 1 Standard drinks or equivalent per week    Comment: Maybe yearly on vacation   Drug use: No   Sexual activity: Not Currently    Birth control/protection: Post-menopausal  Other Topics Concern   Not on file  Social History Narrative   Not on file   Social Drivers of  Health   Financial Resource Strain: Low Risk  (07/31/2023)   Overall Financial Resource Strain (CARDIA)    Difficulty of Paying Living Expenses: Not hard at all  Food Insecurity: No Food Insecurity (07/31/2023)   Hunger Vital Sign    Worried About Running Out of Food in the Last  Year: Never true    Ran Out of Food in the Last Year: Never true  Transportation Needs: No Transportation Needs (07/31/2023)   PRAPARE - Administrator, Civil Service (Medical): No    Lack of Transportation (Non-Medical): No  Physical Activity: Inactive (07/31/2023)   Exercise Vital Sign    Days of Exercise per Week: 0 days    Minutes of Exercise per Session: Not on file  Stress: No Stress Concern Present (07/31/2023)   [employer] of Occupational Health - Occupational Stress Questionnaire    Feeling of Stress: Not at all  Social Connections: Moderately Isolated (07/31/2023)   Social Connection and Isolation Panel    Frequency of Communication with Friends and Family: More than three times a week    Frequency of Social Gatherings with Friends and Family: Twice a week    Attends Religious Services: More than 4 times per year    Active Member of [employer] or Organizations: No    Attends Engineer, structural: Not on file    Marital Status: Divorced  Intimate Partner Violence: Not At Risk (06/23/2023)   Humiliation, Afraid, Rape, and Kick questionnaire    Fear of Current or Ex-Partner: No    Emotionally Abused: No    Physically Abused: No    Sexually Abused: No    Physical Exam: Vital signs in last 24 hours: @Temp  (!) 97.2 F (36.2 C)  GEN: NAD EYE: Sclerae anicteric ENT: MMM CV: Non-tachycardic Pulm: CTA b/l GI: Soft, NT/ND NEURO:  Alert & Oriented x 3   Gordy Starch, MD [facility]  08/13/2023 2:30 PM

## 2023-08-13 NOTE — Progress Notes (Signed)
1437 Robinul 0.1 mg IV given due large amount of secretions upon assessment.  MD made aware, vss

## 2023-08-13 NOTE — Patient Instructions (Signed)
 Please read handouts provided. Advance diet as tolerated. Continue present medications. Await pathology results.   YOU HAD AN ENDOSCOPIC PROCEDURE TODAY AT THE [facility]:   Refer to the procedure report that was given to you for any specific questions about what was found during the examination.  If the procedure report does not answer your questions, please call your gastroenterologist to clarify.  If you requested that your care partner not be given the details of your procedure findings, then the procedure report has been included in a sealed envelope for you to review at your convenience later.  YOU SHOULD EXPECT: Some feelings of bloating in the abdomen. Passage of more gas than usual.  Walking can help get rid of the air that was put into your GI tract during the procedure and reduce the bloating. If you had a lower endoscopy (such as a colonoscopy or flexible sigmoidoscopy) you may notice spotting of blood in your stool or on the toilet paper. If you underwent a bowel prep for your procedure, you may not have a normal bowel movement for a few days.  Please Note:  You might notice some irritation and congestion in your nose or some drainage.  This is from the oxygen used during your procedure.  There is no need for concern and it should clear up in a day or so.  SYMPTOMS TO REPORT IMMEDIATELY:  Following upper endoscopy (EGD)  Vomiting of blood or coffee ground material  New chest pain or pain under the shoulder blades  Painful or persistently difficult swallowing  New shortness of breath  Fever of 100F or higher  Black, tarry-looking stools  For urgent or emergent issues, a gastroenterologist can be reached at any hour by calling [phone]. Do not use MyChart messaging for urgent concerns.    DIET:  We do recommend a small meal at first, but then you may proceed to your regular diet.  Drink plenty of fluids but you should avoid alcoholic beverages for 24  hours.  ACTIVITY:  You should plan to take it easy for the rest of today and you should NOT DRIVE or use heavy machinery until tomorrow (because of the sedation medicines used during the test).    FOLLOW UP: Our staff will call the number listed on your records the next business day following your procedure.  We will call around 7:15- 8:00 am to check on you and address any questions or concerns that you may have regarding the information given to you following your procedure. If we do not reach you, we will leave a message.     If any biopsies were taken you will be contacted by phone or by letter within the next 1-3 weeks.  Please call us  at [phone] if you have not heard about the biopsies in 3 weeks.    SIGNATURES/CONFIDENTIALITY: You and/or your care partner have signed paperwork which will be entered into your electronic medical record.  These signatures attest to the fact that that the information above on your After Visit Summary has been reviewed and is understood.  Full responsibility of the confidentiality of this discharge information lies with you and/or your care-partner.

## 2023-08-13 NOTE — Op Note (Signed)
 [facility] Patient Name: Alison Garner Procedure Date: 08/13/2023 2:36 PM MRN: [id number] Endoscopist: Gordy CHRISTELLA Starch , MD, [id number] Age: 73 Referring MD:  Date of Birth: 03-05-1950 Gender: Female Account #: [id number] Procedure:                Upper GI endoscopy Indications:              Follow-up of malignant esophageal adenocarcinoma;                            Diagnosed in 2022, last EGD January 2025 with foci                            of adenocarcinoma in the gastric cardia with                            ongoing chemotherapy treatment with Dr. Cloretta Medicines:                Monitored Anesthesia Care Procedure:                Pre-Anesthesia Assessment:                           - Prior to the procedure, a History and Physical                            was performed, and patient medications and                            allergies were reviewed. The patient's tolerance of                            previous anesthesia was also reviewed. The risks                            and benefits of the procedure and the sedation                            options and risks were discussed with the patient.                            All questions were answered, and informed consent                            was obtained. Prior Anticoagulants: The patient has                            taken no anticoagulant or antiplatelet agents. ASA                            Grade Assessment: III - A patient with severe                            systemic disease. After reviewing the risks and  benefits, the patient was deemed in satisfactory                            condition to undergo the procedure.                           After obtaining informed consent, the endoscope was                            passed under direct vision. Throughout the                            procedure, the patient's blood pressure, pulse, and                            oxygen  saturations were monitored continuously. The                            Olympus Scope [id number] was introduced through the                            mouth, and advanced to the second part of duodenum.                            The upper GI endoscopy was accomplished without                            difficulty. The patient tolerated the procedure                            well. Scope In: Scope Out: Findings:                 Moderate mucosal changes characterized by                            nodularity, scarring and stricturing were found in                            the distal esophagus at 37 cm (3 cm above GE                            junction). There was dilated effect from scope                            passage. Biopsies were taken with a cold forceps                            for histology.                           Localized nodular mucosa was found in the cardia                            immediately distal to the Z-line. Biopsies  were                            taken with a cold forceps for histology.                           A 2 cm hiatal hernia was present.                           The gastroesophageal flap valve was visualized                            endoscopically and classified as Hill Grade IV (no                            fold, wide open lumen, hiatal hernia present).                           The exam of the stomach was otherwise normal.                           The examined duodenum was normal. Complications:            No immediate complications. Estimated Blood Loss:     Estimated blood loss was minimal. Impression:               - Nodular, scarred mucosa with stricture in the                            esophagus at 37 cm. Dilation effect from scope                            passage. Biopsied.                           - Nodular mucosa in the cardia immediately distal                            to the Z-line. Biopsied.                           - 2  cm hiatal hernia.                           - Normal examined duodenum. Recommendation:           - Patient has a contact number available for                            emergencies. The signs and symptoms of potential                            delayed complications were discussed with the                            patient. Return to normal activities tomorrow.  Written discharge instructions were provided to the                            patient.                           - Advance diet as tolerated.                           - Continue present medications.                           - Await pathology results. Gordy CHRISTELLA Starch, MD 08/13/2023 3:01:57 PM This report has been signed electronically.

## 2023-08-13 NOTE — Progress Notes (Signed)
 Porta cath right upper chest wall.

## 2023-08-14 ENCOUNTER — Telehealth: Payer: Self-pay | Admitting: *Deleted

## 2023-08-14 LAB — SURGICAL PATHOLOGY

## 2023-08-14 NOTE — Telephone Encounter (Signed)
  Follow up Call-     08/13/2023    1:39 PM 02/12/2023    9:14 AM 08/14/2022    9:20 AM 04/15/2022    9:42 AM 04/30/2021    9:32 AM 12/13/2020   11:11 AM  Call back number  Post procedure Call Back phone  # [phone]  Permission to leave phone message Yes Yes Yes Yes Yes Yes     Patient questions:  Do you have a fever, pain , or abdominal swelling? No. Pain Score  0 *  Have you tolerated food without any problems? Yes.    Have you been able to return to your normal activities? Yes.    Do you have any questions about your discharge instructions: Diet   No. Medications  No. Follow up visit  No.  Do you have questions or concerns about your Care? No.  Actions: * If pain score is 4 or above: No action needed, pain <4.

## 2023-08-18 ENCOUNTER — Inpatient Hospital Stay: Attending: Oncology | Admitting: Oncology

## 2023-08-18 ENCOUNTER — Encounter: Payer: Self-pay | Admitting: *Deleted

## 2023-08-18 ENCOUNTER — Inpatient Hospital Stay

## 2023-08-18 ENCOUNTER — Ambulatory Visit: Payer: Self-pay | Admitting: Internal Medicine

## 2023-08-18 VITALS — BP 119/75 | HR 100 | Temp 98.1°F | Resp 18 | Ht 65.0 in | Wt 133.3 lb

## 2023-08-18 DIAGNOSIS — M8580 Other specified disorders of bone density and structure, unspecified site: Secondary | ICD-10-CM | POA: Insufficient documentation

## 2023-08-18 DIAGNOSIS — R7989 Other specified abnormal findings of blood chemistry: Secondary | ICD-10-CM | POA: Diagnosis not present

## 2023-08-18 DIAGNOSIS — D709 Neutropenia, unspecified: Secondary | ICD-10-CM | POA: Insufficient documentation

## 2023-08-18 DIAGNOSIS — Z9221 Personal history of antineoplastic chemotherapy: Secondary | ICD-10-CM | POA: Insufficient documentation

## 2023-08-18 DIAGNOSIS — D6959 Other secondary thrombocytopenia: Secondary | ICD-10-CM | POA: Insufficient documentation

## 2023-08-18 DIAGNOSIS — C155 Malignant neoplasm of lower third of esophagus: Secondary | ICD-10-CM | POA: Insufficient documentation

## 2023-08-18 DIAGNOSIS — J90 Pleural effusion, not elsewhere classified: Secondary | ICD-10-CM | POA: Diagnosis not present

## 2023-08-18 DIAGNOSIS — R600 Localized edema: Secondary | ICD-10-CM | POA: Insufficient documentation

## 2023-08-18 DIAGNOSIS — T451X5A Adverse effect of antineoplastic and immunosuppressive drugs, initial encounter: Secondary | ICD-10-CM | POA: Insufficient documentation

## 2023-08-18 DIAGNOSIS — J439 Emphysema, unspecified: Secondary | ICD-10-CM | POA: Insufficient documentation

## 2023-08-18 DIAGNOSIS — Z923 Personal history of irradiation: Secondary | ICD-10-CM | POA: Insufficient documentation

## 2023-08-18 DIAGNOSIS — G620 Drug-induced polyneuropathy: Secondary | ICD-10-CM | POA: Insufficient documentation

## 2023-08-18 NOTE — Progress Notes (Signed)
 Molecular testing for Her2, PDL-1 and CLDN 18 ordered from [facility] for accession number [id number]

## 2023-08-18 NOTE — Progress Notes (Signed)
  [facility] OFFICE PROGRESS NOTE   Diagnosis: Esophagus cancer  INTERVAL HISTORY:   Alison Garner returns as scheduled.  She generally feels well.  Good appetite.  Mild exertional dyspnea.  She has occasional dysphagia with certain foods.  No consistent dysphagia. She underwent an upper endoscopy 08/13/2023 by Dr. Albertus.  Nodular scarred mucosa was noted at the distal esophagus.  The area was dilated and biopsied.  Nodular mucosa was noted in the gastric cardia.  This was biopsied.  Objective:  Vital signs in last 24 hours:  Blood pressure 119/75, pulse 100, temperature 98.1 F (36.7 C), temperature source Temporal, resp. rate 18, height 5' 5 (1.651 m), weight 133 lb 4.8 oz (60.5 kg), SpO2 98%.    Lymphatics: No cervical, supraclavicular, axillary, or inguinal nodes Resp: Breath sounds, no respiratory distress Cardio: Distant heart sounds, regular rhythm GI: No hepatosplenomegaly Vascular: No leg edema   Portacath/PICC-without erythema  Lab Results:  Lab Results  Component Value Date   WBC 5.1 06/16/2023   HGB 13.7 06/16/2023   HCT 39.5 06/16/2023   MCV 92.1 06/16/2023   PLT 154 06/16/2023   NEUTROABS 3.0 06/16/2023    CMP  Lab Results  Component Value Date   NA 133 (L) 06/16/2023   K 4.3 06/16/2023   CL 98 06/16/2023   CO2 26 06/16/2023   GLUCOSE 66 (L) 06/16/2023   BUN 8 06/16/2023   CREATININE 0.96 06/16/2023   CALCIUM  10.0 06/16/2023   PROT 6.5 06/16/2023   ALBUMIN 4.0 06/16/2023   AST 32 06/16/2023   ALT 21 06/16/2023   ALKPHOS 75 06/16/2023   BILITOT 0.4 06/16/2023   GFRNONAA >60 06/16/2023    Lab Results  Component Value Date   CEA 12.88 (H) 06/16/2023    Medications: I have reviewed the patient's current medications.   Assessment/Plan: Distal esophagus cancer Esophagram 11/21/2020-moderate to severe esophageal dysmotility, narrowing of the distal esophagus just above the GE junction to 4 mm, 13 mm barium tablet stuck in the  distal esophagus Upper endoscopy 12/13/2020-severe esophagitis with nodularity and stricturing at 36 cm-adenocarcinoma, HER2 negative, no loss of mismatch repair protein expression, PD-L1 combined positive score less than 1% CT chest 12/14/2020-accentuated density at the distal esophageal lumen above a small type I hiatal hernia concerning for malignancy, nonpathologically enlarged AP window and right hilar nodes PET 12/21/2020-abnormal FDG uptake at the distal esophagus, no evidence of nodal or distant metastatic disease Radiation 01/08/2021-02/16/2021 Cycle 1 weekly Taxol /carboplatin  01/08/2021 Cycle 2 weekly Taxol /carboplatin  01/15/2021 Treatment held 01/22/2021 due to neutropenia Positive COVID test 02/02/2021 Taxol /carboplatin  02/15/2021 Endoscopy 04/30/2021-shallow ulceration at the distal esophagus, nodularity at the gastric cardia-biopsies negative for malignancy CTs 04/02/2022-small mediastinal and right hilar lymph nodes.  Esophagus is mildly patulous decreased from previous in the mid to upper aspect of the mediastinum.  More distally the wall thickening of the esophagus appears slightly decreased.  Small hiatal hernia.  The area of thickening does correspond to the abnormality on PET-CT.  Emphysema. Endoscopy 04/15/2022-Short segment salmon-colored mucosa 1 cm above the Z-line.  Scarring at the gastroesophageal junction/Z-line with nonobstructing narrowing.  Nodular mucosa in the gastric cardia immediately distal to the Z-line.  Pathology on GE junction nodule-invasive moderately differentiated adenocarcinoma, tumor arises within a background of high-grade dysplasia, mild chronic active gastritis with foveolar hyperplasia, negative for squamous epithelium and intestinal metaplasia.  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  Negative for HER2 by IHC (0), PD-L1 CPS 2%. CLDN 18.2 negative, less than 5% of tumor  cells with 2+ and/or 3+ membrane staining. PET scan  04/25/2022-increased radiotracer uptake associated with the distal esophagus at the level of the GE junction.  No evidence of nodal metastasis or distant metastatic disease. Cycle 1 FOLFOX 06/04/2022 Cycle 2 FOLFOX 06/18/2022, oxaliplatin  dose reduced secondary thrombocytopenia Cycle 3 FOLFOX 06/24/2022, oxaliplatin  dose reduced secondary thrombocytopenia, 5-FU dose reduced secondary to hand/foot syndrome 07/16/2022 chemotherapy held due to thrombocytopenia, dehydration Cycle 4 FOLFOX 07/23/2022, oxaliplatin  dose reduced due to thrombocytopenia Cycle 5 FOLFOX 08/05/2022 CT chest 08/13/2022-mild focal wall thickening at the distal esophagus, 8 mm gastropathic node-not FDG avid on PET, unchanged small mediastinal nodes no evidence of metastatic disease in the chest Upper endoscopy 08/14/2022-benign-appearing mild stenosis in the distal esophagus-microscopic focus of adenocarcinoma Cycle 6 FOLFOX 09/04/2022 Maintenance 5-fluorouracil  via pump every 2 weeks beginning 10/30/2022 5-fluorouracil  infusion 11/14/2022 5-FU  infusion 11/27/2022 5-FU infusion 12/11/2022 5-FU infusion 12/25/2022 5-FU infusion 01/08/2023 CT chest 01/16/2023-stable distal esophageal thickening, no evidence of metastatic disease Upper endoscopy 02/12/2023-localized mucosal changes characterized by nodularity and scarring in the distal esophagus (cardiac mucosa with mild chronic nonspecific carditis, negative for intestinal metaplasia or dysplasia).  Localized mild mucosal changes characterized by nodularity in the cardia (invasive moderately differentiated adenocarcinoma arising in background of high-grade dysplasia); foundation 1-tumor mutation burden 10, HRD signature negative, microsatellite stable, ERBB2 amplification.  CLDN18.2 not evaluable due to insufficient tumor cells.  PD-L1-quantity not sufficient. Cycle 1 Taxol /ramucirumab  03/04/2023 Cycle 2 held 03/18/2023 due to neutropenia Cycle 2 Taxol /ramucirumab  03/25/2023, Taxol  dose reduced CT  chest 04/03/2023-esophagus with wall thickening distally.  Emphysema.  Bilateral pleural effusions.  No PE. Cycle 3 held 04/08/2023 due to edema Cycle 3 Taxol /ramucirumab  04/15/2023 Treatment with Taxol /ramucirumab  discontinued per patient 04/29/2023 08/13/2023: Upper endoscopy: Nodular mucosa with a stricture at 37 cm, nodular mucosa in the gastric cardia: Biopsies-small focus of adenocarcinoma at the esophagus biopsy, small focus of adenocarcinoma of the gastric cardia Solid dysphagia secondary #1, resolved COPD on chest CT 12/14/2020 Hemorrhoids Hyperlipidemia Irritable bowel syndrome Hypothyroidism Tobacco use Thrombocytopenia secondary to chemotherapy Hand/foot syndrome-5-FU dose reduced with cycle 3 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  Chest CT showed bilateral pleural effusions with associated atelectatic changes.  No PE.  Persistent distal thickening in the esophagus.  Echocardiogram 04/04/2023 with LVEF 55 to 60%, normal LV function, no regional wall motion abnormalities.  Lasix  40 mg daily initiated.  Edema resolved.  Chest x-ray 04/14/2023-mild blunting of the left costophrenic angle without overt pleural effusion.     Disposition: Ms. Ingman has a history of localized esophagus cancer.  The upper endoscopy suite confirmed persistent tumor at the distal esophagus and gastric cardia.  She has minimal symptoms related to cancer present.  She indicated her quality of life is of great importance to her.  We discussed potential treatment options including nivolumab, FOLFIRI, and repeat treatment with FOLFOX.  She is concerned about the potential for hearing loss, peripheral neuropathy, and an allergic reaction with FOLFOX.  We will request CLDN18, PD-L1, and HER2 testing on the current biopsy tissue.  She will  return for an office visit and further discussion in 2 weeks. She has been diagnosed with osteopenia.  There was a score in the osteoporotic range at the distal radius.  She is taking vitamin D.  Arley Hof, MD  08/18/2023  9:08 AM

## 2023-08-22 ENCOUNTER — Ambulatory Visit: Admitting: Oncology

## 2023-08-22 ENCOUNTER — Encounter

## 2023-09-01 ENCOUNTER — Inpatient Hospital Stay (HOSPITAL_BASED_OUTPATIENT_CLINIC_OR_DEPARTMENT_OTHER): Admitting: Nurse Practitioner

## 2023-09-01 VITALS — BP 123/68 | HR 89 | Temp 98.1°F | Resp 18 | Ht 65.0 in | Wt 135.6 lb

## 2023-09-01 DIAGNOSIS — C155 Malignant neoplasm of lower third of esophagus: Secondary | ICD-10-CM | POA: Diagnosis not present

## 2023-09-01 NOTE — Progress Notes (Unsigned)
 [facility] OFFICE PROGRESS NOTE   Diagnosis: Esophagus cancer  INTERVAL HISTORY:   Alison Garner returns as scheduled.  She feels well.  No dysphagia.  No nausea.  She has a good appetite.  Only complaint is mild dyspnea when walking.  Objective:  Vital signs in last 24 hours:  Blood pressure 123/68, pulse 89, temperature 98.1 F (36.7 C), temperature source Temporal, resp. rate 18, height 5' 5 (1.651 m), weight 135 lb 9.6 oz (61.5 kg), SpO2 100%.    HEENT: No thrush or ulcers. Resp: Distant breath sounds.  No respiratory distress. Cardio: Regular, distant heart sounds. GI: No hepatosplenomegaly. Vascular: No leg edema. Port-A-Cath without erythema.  Lab Results:  Lab Results  Component Value Date   WBC 5.1 06/16/2023   HGB 13.7 06/16/2023   HCT 39.5 06/16/2023   MCV 92.1 06/16/2023   PLT 154 06/16/2023   NEUTROABS 3.0 06/16/2023    Imaging:  No results found.  Medications: I have reviewed the patient's current medications.  Assessment/Plan: Distal esophagus cancer Esophagram 11/21/2020-moderate to severe esophageal dysmotility, narrowing of the distal esophagus just above the GE junction to 4 mm, 13 mm barium tablet stuck in the distal esophagus Upper endoscopy 12/13/2020-severe esophagitis with nodularity and stricturing at 36 cm-adenocarcinoma, HER2 negative, no loss of mismatch repair protein expression, PD-L1 combined positive score less than 1% CT chest 12/14/2020-accentuated density at the distal esophageal lumen above a small type I hiatal hernia concerning for malignancy, nonpathologically enlarged AP window and right hilar nodes PET 12/21/2020-abnormal FDG uptake at the distal esophagus, no evidence of nodal or distant metastatic disease Radiation 01/08/2021-02/16/2021 Cycle 1 weekly Taxol /carboplatin  01/08/2021 Cycle 2 weekly Taxol /carboplatin  01/15/2021 Treatment held 01/22/2021 due to neutropenia Positive COVID test  02/02/2021 Taxol /carboplatin  02/15/2021 Endoscopy 04/30/2021-shallow ulceration at the distal esophagus, nodularity at the gastric cardia-biopsies negative for malignancy CTs 04/02/2022-small mediastinal and right hilar lymph nodes.  Esophagus is mildly patulous decreased from previous in the mid to upper aspect of the mediastinum.  More distally the wall thickening of the esophagus appears slightly decreased.  Small hiatal hernia.  The area of thickening does correspond to the abnormality on PET-CT.  Emphysema. Endoscopy 04/15/2022-Short segment salmon-colored mucosa 1 cm above the Z-line.  Scarring at the gastroesophageal junction/Z-line with nonobstructing narrowing.  Nodular mucosa in the gastric cardia immediately distal to the Z-line.  Pathology on GE junction nodule-invasive moderately differentiated adenocarcinoma, tumor arises within a background of high-grade dysplasia, mild chronic active gastritis with foveolar hyperplasia, negative for squamous epithelium and intestinal metaplasia.  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  Negative for HER2 by IHC (0), PD-L1 CPS 2%. CLDN 18.2 negative, less than 5% of tumor cells with 2+ and/or 3+ membrane staining. PET scan 04/25/2022-increased radiotracer uptake associated with the distal esophagus at the level of the GE junction.  No evidence of nodal metastasis or distant metastatic disease. Cycle 1 FOLFOX 06/04/2022 Cycle 2 FOLFOX 06/18/2022, oxaliplatin  dose reduced secondary thrombocytopenia Cycle 3 FOLFOX 06/24/2022, oxaliplatin  dose reduced secondary thrombocytopenia, 5-FU dose reduced secondary to hand/foot syndrome 07/16/2022 chemotherapy held due to thrombocytopenia, dehydration Cycle 4 FOLFOX 07/23/2022, oxaliplatin  dose reduced due to thrombocytopenia Cycle 5 FOLFOX 08/05/2022 CT chest 08/13/2022-mild focal wall thickening at the distal esophagus, 8 mm gastropathic node-not FDG avid on PET, unchanged small mediastinal nodes no  evidence of metastatic disease in the chest Upper endoscopy 08/14/2022-benign-appearing mild stenosis in the distal esophagus-microscopic focus of adenocarcinoma Cycle 6 FOLFOX 09/04/2022 Maintenance 5-fluorouracil  via pump every 2 weeks beginning 10/30/2022  5-fluorouracil  infusion 11/14/2022 5-FU  infusion 11/27/2022 5-FU infusion 12/11/2022 5-FU infusion 12/25/2022 5-FU infusion 01/08/2023 CT chest 01/16/2023-stable distal esophageal thickening, no evidence of metastatic disease Upper endoscopy 02/12/2023-localized mucosal changes characterized by nodularity and scarring in the distal esophagus (cardiac mucosa with mild chronic nonspecific carditis, negative for intestinal metaplasia or dysplasia).  Localized mild mucosal changes characterized by nodularity in the cardia (invasive moderately differentiated adenocarcinoma arising in background of high-grade dysplasia); foundation 1-tumor mutation burden 10, HRD signature negative, microsatellite stable, ERBB2 amplification.  CLDN18.2 not evaluable due to insufficient tumor cells.  PD-L1-quantity not sufficient. Cycle 1 Taxol /ramucirumab  03/04/2023 Cycle 2 held 03/18/2023 due to neutropenia Cycle 2 Taxol /ramucirumab  03/25/2023, Taxol  dose reduced CT chest 04/03/2023-esophagus with wall thickening distally.  Emphysema.  Bilateral pleural effusions.  No PE. Cycle 3 held 04/08/2023 due to edema Cycle 3 Taxol /ramucirumab  04/15/2023 Treatment with Taxol /ramucirumab  discontinued per patient 04/29/2023 08/13/2023: Upper endoscopy: Nodular mucosa with a stricture at 37 cm, nodular mucosa in the gastric cardia: Biopsies-small focus of adenocarcinoma at the esophagus biopsy, small focus of adenocarcinoma of the gastric cardia.  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  Chest CT showed bilateral pleural effusions with associated atelectatic changes.  No PE.  Persistent distal thickening in the esophagus.  Echocardiogram 04/04/2023 with LVEF 55 to 60%, normal LV function, no regional wall motion abnormalities.  Lasix  40 mg daily initiated.  Edema resolved.  Chest x-ray 04/14/2023-mild blunting of the left costophrenic angle without overt pleural effusion.      Disposition: Alison Garner appears stable.  We reviewed the negative HER2 and claudin 18.2 results as well as the quantity not sufficient for PD-L1 testing.  We again discussed potential treatment options including nivolumab, FOLFIRI and repeat treatment with FOLFOX.  She has made a decision for observation.  She is not interested in pursuing additional evaluation of the mild dyspnea on exertion at this time.  She will return for a port flush and follow-up visit in 6 weeks.  She will contact the office in the interim with any problems.  Patient seen with Dr. Cloretta.   Olam Ned ANP/GNP-BC   09/01/2023  2:00 PM  This was a shared visit with Olam,.  We discussed results of the HER2 and claudin 18.2 testing with Alison Garner.  We discussed treatment options.  She does not wish to consider chemotherapy or immunotherapy.  The plan is to continue observation.  I was present for greater than 50% of today's visit.  I performed medical decision making.  Arvella Cloretta, MD

## 2023-09-16 ENCOUNTER — Other Ambulatory Visit: Payer: Self-pay | Admitting: Family Medicine

## 2023-10-10 ENCOUNTER — Other Ambulatory Visit: Payer: Self-pay | Admitting: Family Medicine

## 2023-10-13 ENCOUNTER — Inpatient Hospital Stay (HOSPITAL_BASED_OUTPATIENT_CLINIC_OR_DEPARTMENT_OTHER): Attending: Oncology | Admitting: Oncology

## 2023-10-13 ENCOUNTER — Inpatient Hospital Stay: Attending: Oncology

## 2023-10-13 VITALS — BP 116/69 | HR 94 | Temp 97.8°F | Resp 18 | Ht 65.0 in | Wt 137.2 lb

## 2023-10-13 DIAGNOSIS — D6959 Other secondary thrombocytopenia: Secondary | ICD-10-CM | POA: Diagnosis not present

## 2023-10-13 DIAGNOSIS — R7989 Other specified abnormal findings of blood chemistry: Secondary | ICD-10-CM | POA: Insufficient documentation

## 2023-10-13 DIAGNOSIS — G620 Drug-induced polyneuropathy: Secondary | ICD-10-CM | POA: Diagnosis not present

## 2023-10-13 DIAGNOSIS — J90 Pleural effusion, not elsewhere classified: Secondary | ICD-10-CM | POA: Diagnosis not present

## 2023-10-13 DIAGNOSIS — D709 Neutropenia, unspecified: Secondary | ICD-10-CM | POA: Diagnosis not present

## 2023-10-13 DIAGNOSIS — J439 Emphysema, unspecified: Secondary | ICD-10-CM | POA: Diagnosis not present

## 2023-10-13 DIAGNOSIS — R600 Localized edema: Secondary | ICD-10-CM | POA: Insufficient documentation

## 2023-10-13 DIAGNOSIS — Z923 Personal history of irradiation: Secondary | ICD-10-CM | POA: Diagnosis not present

## 2023-10-13 DIAGNOSIS — C155 Malignant neoplasm of lower third of esophagus: Secondary | ICD-10-CM

## 2023-10-13 DIAGNOSIS — Z9221 Personal history of antineoplastic chemotherapy: Secondary | ICD-10-CM | POA: Diagnosis not present

## 2023-10-13 NOTE — Progress Notes (Signed)
 [facility] OFFICE PROGRESS NOTE   Diagnosis: Esophagus cancer  INTERVAL HISTORY:   Ms. Camerer returns as scheduled.  She feels well.  No dysphagia.  No new complaint.  She has chronic [place] dyspnea.  Objective:  Vital signs in last 24 hours:  Blood pressure 116/69, pulse 94, temperature 97.8 F (36.6 C), temperature source Temporal, resp. rate 18, height 5' 5 (1.651 m), weight 137 lb 3.2 oz (62.2 kg), SpO2 100%.    Lymphatics: No cervical, supraclavicular, or axillary nodes Resp: Distant breath sounds, no respiratory distress Cardio: Regular rate and rhythm GI: No hepatosplenomegaly, no apparent ascites, no mass Vascular: No leg edema   Portacath/PICC-without erythema  Lab Results:  Lab Results  Component Value Date   WBC 5.1 06/16/2023   HGB 13.7 06/16/2023   HCT 39.5 06/16/2023   MCV 92.1 06/16/2023   PLT 154 06/16/2023   NEUTROABS 3.0 06/16/2023    CMP  Lab Results  Component Value Date   NA 133 (L) 06/16/2023   K 4.3 06/16/2023   CL 98 06/16/2023   CO2 26 06/16/2023   GLUCOSE 66 (L) 06/16/2023   BUN 8 06/16/2023   CREATININE 0.96 06/16/2023   CALCIUM  10.0 06/16/2023   PROT 6.5 06/16/2023   ALBUMIN 4.0 06/16/2023   AST 32 06/16/2023   ALT 21 06/16/2023   ALKPHOS 75 06/16/2023   BILITOT 0.4 06/16/2023   GFRNONAA >60 06/16/2023    Lab Results  Component Value Date   CEA 12.88 (H) 06/16/2023     Medications: I have reviewed the patient's current medications.   Assessment/Plan:  Distal esophagus cancer Esophagram 11/21/2020-moderate to severe esophageal dysmotility, narrowing of the distal esophagus just above the GE junction to 4 mm, 13 mm barium tablet stuck in the distal esophagus Upper endoscopy 12/13/2020-severe esophagitis with nodularity and stricturing at 36 cm-adenocarcinoma, HER2 negative, no loss of mismatch repair protein expression, PD-L1 combined positive score less than 1% CT chest 12/14/2020-accentuated density  at the distal esophageal lumen above a small type I hiatal hernia concerning for malignancy, nonpathologically enlarged AP window and right hilar nodes PET 12/21/2020-abnormal FDG uptake at the distal esophagus, no evidence of nodal or distant metastatic disease Radiation 01/08/2021-02/16/2021 Cycle 1 weekly Taxol /carboplatin  01/08/2021 Cycle 2 weekly Taxol /carboplatin  01/15/2021 Treatment held 01/22/2021 due to neutropenia Positive COVID test 02/02/2021 Taxol /carboplatin  02/15/2021 Endoscopy 04/30/2021-shallow ulceration at the distal esophagus, nodularity at the gastric cardia-biopsies negative for malignancy CTs 04/02/2022-small mediastinal and right hilar lymph nodes.  Esophagus is mildly patulous decreased from previous in the mid to upper aspect of the mediastinum.  More distally the wall thickening of the esophagus appears slightly decreased.  Small hiatal hernia.  The area of thickening does correspond to the abnormality on PET-CT.  Emphysema. Endoscopy 04/15/2022-Short segment salmon-colored mucosa 1 cm above the Z-line.  Scarring at the gastroesophageal junction/Z-line with nonobstructing narrowing.  Nodular mucosa in the gastric cardia immediately distal to the Z-line.  Pathology on GE junction nodule-invasive moderately differentiated adenocarcinoma, tumor arises within a background of high-grade dysplasia, mild chronic active gastritis with foveolar hyperplasia, negative for squamous epithelium and intestinal metaplasia.  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  Negative for HER2 by IHC (0), PD-L1 CPS 2%. CLDN 18.2 negative, less than 5% of tumor cells with 2+ and/or 3+ membrane staining. PET scan 04/25/2022-increased radiotracer uptake associated with the distal esophagus at the level of the GE junction.  No evidence of nodal metastasis or distant metastatic disease. Cycle 1 FOLFOX 06/04/2022 Cycle 2  FOLFOX 06/18/2022, oxaliplatin  dose reduced secondary  thrombocytopenia Cycle 3 FOLFOX 06/24/2022, oxaliplatin  dose reduced secondary thrombocytopenia, 5-FU dose reduced secondary to hand/foot syndrome 07/16/2022 chemotherapy held due to thrombocytopenia, dehydration Cycle 4 FOLFOX 07/23/2022, oxaliplatin  dose reduced due to thrombocytopenia Cycle 5 FOLFOX 08/05/2022 CT chest 08/13/2022-mild focal wall thickening at the distal esophagus, 8 mm gastropathic node-not FDG avid on PET, unchanged small mediastinal nodes no evidence of metastatic disease in the chest Upper endoscopy 08/14/2022-benign-appearing mild stenosis in the distal esophagus-microscopic focus of adenocarcinoma Cycle 6 FOLFOX 09/04/2022 Maintenance 5-fluorouracil  via pump every 2 weeks beginning 10/30/2022 5-fluorouracil  infusion 11/14/2022 5-FU  infusion 11/27/2022 5-FU infusion 12/11/2022 5-FU infusion 12/25/2022 5-FU infusion 01/08/2023 CT chest 01/16/2023-stable distal esophageal thickening, no evidence of metastatic disease Upper endoscopy 02/12/2023-localized mucosal changes characterized by nodularity and scarring in the distal esophagus (cardiac mucosa with mild chronic nonspecific carditis, negative for intestinal metaplasia or dysplasia).  Localized mild mucosal changes characterized by nodularity in the cardia (invasive moderately differentiated adenocarcinoma arising in background of high-grade dysplasia); foundation 1-tumor mutation burden 10, HRD signature negative, microsatellite stable, ERBB2 amplification.  CLDN18.2 not evaluable due to insufficient tumor cells.  PD-L1-quantity not sufficient. Cycle 1 Taxol /ramucirumab  03/04/2023 Cycle 2 held 03/18/2023 due to neutropenia Cycle 2 Taxol /ramucirumab  03/25/2023, Taxol  dose reduced CT chest 04/03/2023-esophagus with wall thickening distally.  Emphysema.  Bilateral pleural effusions.  No PE. Cycle 3 held 04/08/2023 due to edema Cycle 3 Taxol /ramucirumab  04/15/2023 Treatment with Taxol /ramucirumab  discontinued per patient 04/29/2023 08/13/2023:  Upper endoscopy: Nodular mucosa with a stricture at 37 cm, nodular mucosa in the gastric cardia: Biopsies-small focus of adenocarcinoma at the esophagus biopsy, small focus of adenocarcinoma of the gastric cardia.  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  Chest CT showed bilateral pleural effusions with associated atelectatic changes.  No PE.  Persistent distal thickening in the esophagus.  Echocardiogram 04/04/2023 with LVEF 55 to 60%, normal LV function, no regional wall motion abnormalities.  Lasix  40 mg daily initiated.  Edema resolved.  Chest x-ray 04/14/2023-mild blunting of the left costophrenic angle without overt pleural effusion.      Disposition: Alison Garner appears stable.  There is no clinical evidence for progression of the esophagus cancer.  We discussed treatment options.  She prefers observation.  She does not wish to schedule surveillance imaging unless she develops new symptoms.  She will return for an office visit and Port-A-Cath flush in 6 weeks.  Arley Hof, MD  10/13/2023  12:22 PM

## 2023-10-13 NOTE — Patient Instructions (Signed)

## 2023-11-24 ENCOUNTER — Inpatient Hospital Stay: Attending: Oncology | Admitting: Oncology

## 2023-11-24 ENCOUNTER — Inpatient Hospital Stay: Attending: Oncology

## 2023-11-24 VITALS — BP 124/62 | HR 93 | Temp 98.0°F | Resp 18 | Ht 65.0 in | Wt 137.6 lb

## 2023-11-24 DIAGNOSIS — D709 Neutropenia, unspecified: Secondary | ICD-10-CM | POA: Diagnosis not present

## 2023-11-24 DIAGNOSIS — R600 Localized edema: Secondary | ICD-10-CM | POA: Insufficient documentation

## 2023-11-24 DIAGNOSIS — Z923 Personal history of irradiation: Secondary | ICD-10-CM | POA: Insufficient documentation

## 2023-11-24 DIAGNOSIS — R059 Cough, unspecified: Secondary | ICD-10-CM | POA: Insufficient documentation

## 2023-11-24 DIAGNOSIS — Z9221 Personal history of antineoplastic chemotherapy: Secondary | ICD-10-CM | POA: Insufficient documentation

## 2023-11-24 DIAGNOSIS — D6959 Other secondary thrombocytopenia: Secondary | ICD-10-CM | POA: Insufficient documentation

## 2023-11-24 DIAGNOSIS — C155 Malignant neoplasm of lower third of esophagus: Secondary | ICD-10-CM | POA: Insufficient documentation

## 2023-11-24 DIAGNOSIS — G620 Drug-induced polyneuropathy: Secondary | ICD-10-CM | POA: Diagnosis not present

## 2023-11-24 DIAGNOSIS — R7989 Other specified abnormal findings of blood chemistry: Secondary | ICD-10-CM | POA: Diagnosis not present

## 2023-11-24 NOTE — Patient Instructions (Signed)

## 2023-11-24 NOTE — Progress Notes (Signed)
 [facility] OFFICE PROGRESS NOTE   Diagnosis: Esophagus cancer  INTERVAL HISTORY:   Alison Garner returns as scheduled.  She feels well.  No dysphagia.  No complaint.  Objective:  Vital signs in last 24 hours:  Blood pressure 124/62, pulse 93, temperature 98 F (36.7 C), temperature source Temporal, resp. rate 18, height 5' 5 (1.651 m), weight 137 lb 9.6 oz (62.4 kg), SpO2 99%.    Lymphatics: No cervical, supraclavicular, or axillary nodes Resp: Distant breath sounds, no respiratory distress Cardio: Distant sounds, regular rhythm GI: No hepatosplenomegaly, no mass, no apparent ascites, nontender Vascular: No leg edema   Portacath/PICC-without erythema  Lab Results:  Lab Results  Component Value Date   WBC 5.1 06/16/2023   HGB 13.7 06/16/2023   HCT 39.5 06/16/2023   MCV 92.1 06/16/2023   PLT 154 06/16/2023   NEUTROABS 3.0 06/16/2023    CMP  Lab Results  Component Value Date   NA 133 (L) 06/16/2023   K 4.3 06/16/2023   CL 98 06/16/2023   CO2 26 06/16/2023   GLUCOSE 66 (L) 06/16/2023   BUN 8 06/16/2023   CREATININE 0.96 06/16/2023   CALCIUM  10.0 06/16/2023   PROT 6.5 06/16/2023   ALBUMIN 4.0 06/16/2023   AST 32 06/16/2023   ALT 21 06/16/2023   ALKPHOS 75 06/16/2023   BILITOT 0.4 06/16/2023   GFRNONAA >60 06/16/2023    Lab Results  Component Value Date   CEA 12.88 (H) 06/16/2023     Medications: I have reviewed the patient's current medications.   Assessment/Plan: Distal esophagus cancer Esophagram 11/21/2020-moderate to severe esophageal dysmotility, narrowing of the distal esophagus just above the GE junction to 4 mm, 13 mm barium tablet stuck in the distal esophagus Upper endoscopy 12/13/2020-severe esophagitis with nodularity and stricturing at 36 cm-adenocarcinoma, HER2 negative, no loss of mismatch repair protein expression, PD-L1 combined positive score less than 1% CT chest 12/14/2020-accentuated density at the distal esophageal  lumen above a small type I hiatal hernia concerning for malignancy, nonpathologically enlarged AP window and right hilar nodes PET 12/21/2020-abnormal FDG uptake at the distal esophagus, no evidence of nodal or distant metastatic disease Radiation 01/08/2021-02/16/2021 Cycle 1 weekly Taxol /carboplatin  01/08/2021 Cycle 2 weekly Taxol /carboplatin  01/15/2021 Treatment held 01/22/2021 due to neutropenia Positive COVID test 02/02/2021 Taxol /carboplatin  02/15/2021 Endoscopy 04/30/2021-shallow ulceration at the distal esophagus, nodularity at the gastric cardia-biopsies negative for malignancy CTs 04/02/2022-small mediastinal and right hilar lymph nodes.  Esophagus is mildly patulous decreased from previous in the mid to upper aspect of the mediastinum.  More distally the wall thickening of the esophagus appears slightly decreased.  Small hiatal hernia.  The area of thickening does correspond to the abnormality on PET-CT.  Emphysema. Endoscopy 04/15/2022-Short segment salmon-colored mucosa 1 cm above the Z-line.  Scarring at the gastroesophageal junction/Z-line with nonobstructing narrowing.  Nodular mucosa in the gastric cardia immediately distal to the Z-line.  Pathology on GE junction nodule-invasive moderately differentiated adenocarcinoma, tumor arises within a background of high-grade dysplasia, mild chronic active gastritis with foveolar hyperplasia, negative for squamous epithelium and intestinal metaplasia.  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  Negative for HER2 by IHC (0), PD-L1 CPS 2%. CLDN 18.2 negative, less than 5% of tumor cells with 2+ and/or 3+ membrane staining. PET scan 04/25/2022-increased radiotracer uptake associated with the distal esophagus at the level of the GE junction.  No evidence of nodal metastasis or distant metastatic disease. Cycle 1 FOLFOX 06/04/2022 Cycle 2 FOLFOX 06/18/2022, oxaliplatin  dose reduced secondary thrombocytopenia  Cycle 3 FOLFOX 06/24/2022,  oxaliplatin  dose reduced secondary thrombocytopenia, 5-FU dose reduced secondary to hand/foot syndrome 07/16/2022 chemotherapy held due to thrombocytopenia, dehydration Cycle 4 FOLFOX 07/23/2022, oxaliplatin  dose reduced due to thrombocytopenia Cycle 5 FOLFOX 08/05/2022 CT chest 08/13/2022-mild focal wall thickening at the distal esophagus, 8 mm gastropathic node-not FDG avid on PET, unchanged small mediastinal nodes no evidence of metastatic disease in the chest Upper endoscopy 08/14/2022-benign-appearing mild stenosis in the distal esophagus-microscopic focus of adenocarcinoma Cycle 6 FOLFOX 09/04/2022 Maintenance 5-fluorouracil  via pump every 2 weeks beginning 10/30/2022 5-fluorouracil  infusion 11/14/2022 5-FU  infusion 11/27/2022 5-FU infusion 12/11/2022 5-FU infusion 12/25/2022 5-FU infusion 01/08/2023 CT chest 01/16/2023-stable distal esophageal thickening, no evidence of metastatic disease Upper endoscopy 02/12/2023-localized mucosal changes characterized by nodularity and scarring in the distal esophagus (cardiac mucosa with mild chronic nonspecific carditis, negative for intestinal metaplasia or dysplasia).  Localized mild mucosal changes characterized by nodularity in the cardia (invasive moderately differentiated adenocarcinoma arising in background of high-grade dysplasia); foundation 1-tumor mutation burden 10, HRD signature negative, microsatellite stable, ERBB2 amplification.  CLDN18.2 not evaluable due to insufficient tumor cells.  PD-L1-quantity not sufficient. Cycle 1 Taxol /ramucirumab  03/04/2023 Cycle 2 held 03/18/2023 due to neutropenia Cycle 2 Taxol /ramucirumab  03/25/2023, Taxol  dose reduced CT chest 04/03/2023-esophagus with wall thickening distally.  Emphysema.  Bilateral pleural effusions.  No PE. Cycle 3 held 04/08/2023 due to edema Cycle 3 Taxol /ramucirumab  04/15/2023 Treatment with Taxol /ramucirumab  discontinued per patient 04/29/2023 08/13/2023: Upper endoscopy: Nodular mucosa with a  stricture at 37 cm, nodular mucosa in the gastric cardia: Biopsies-small focus of adenocarcinoma at the esophagus biopsy, small focus of adenocarcinoma of the gastric cardia.  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  Chest CT showed bilateral pleural effusions with associated atelectatic changes.  No PE.  Persistent distal thickening in the esophagus.  Echocardiogram 04/04/2023 with LVEF 55 to 60%, normal LV function, no regional wall motion abnormalities.  Lasix  40 mg daily initiated.  Edema resolved.  Chest x-ray 04/14/2023-mild blunting of the left costophrenic angle without overt pleural effusion.       Disposition: Alison Garner is unchanged.  There is no clinical evidence for progression of the esophagus cancer.  He would like to continue observation.  We discussed the plan for a trial of nivolumab and there is evidence of disease progression.  She declines an influenza vaccine.  She will return for an office visit and Port-A-Cath flush in 6 weeks.  Arley Hof, MD  11/24/2023  11:58 AM

## 2023-12-23 ENCOUNTER — Other Ambulatory Visit: Payer: Self-pay | Admitting: Family Medicine

## 2023-12-25 ENCOUNTER — Encounter: Payer: Self-pay | Admitting: Family Medicine

## 2023-12-31 ENCOUNTER — Encounter: Payer: Self-pay | Admitting: *Deleted

## 2024-01-05 ENCOUNTER — Inpatient Hospital Stay

## 2024-01-05 ENCOUNTER — Inpatient Hospital Stay: Attending: Oncology | Admitting: Oncology

## 2024-01-05 VITALS — BP 137/71 | HR 87 | Temp 98.2°F | Resp 18 | Ht 65.0 in | Wt 139.2 lb

## 2024-01-05 DIAGNOSIS — J90 Pleural effusion, not elsewhere classified: Secondary | ICD-10-CM | POA: Diagnosis not present

## 2024-01-05 DIAGNOSIS — Z9221 Personal history of antineoplastic chemotherapy: Secondary | ICD-10-CM | POA: Insufficient documentation

## 2024-01-05 DIAGNOSIS — R600 Localized edema: Secondary | ICD-10-CM | POA: Insufficient documentation

## 2024-01-05 DIAGNOSIS — Z923 Personal history of irradiation: Secondary | ICD-10-CM | POA: Diagnosis not present

## 2024-01-05 DIAGNOSIS — J439 Emphysema, unspecified: Secondary | ICD-10-CM | POA: Diagnosis not present

## 2024-01-05 DIAGNOSIS — G620 Drug-induced polyneuropathy: Secondary | ICD-10-CM | POA: Diagnosis not present

## 2024-01-05 DIAGNOSIS — D6959 Other secondary thrombocytopenia: Secondary | ICD-10-CM | POA: Diagnosis not present

## 2024-01-05 DIAGNOSIS — C155 Malignant neoplasm of lower third of esophagus: Secondary | ICD-10-CM | POA: Diagnosis present

## 2024-01-05 DIAGNOSIS — R7989 Other specified abnormal findings of blood chemistry: Secondary | ICD-10-CM | POA: Insufficient documentation

## 2024-01-05 NOTE — Patient Instructions (Signed)

## 2024-01-05 NOTE — Progress Notes (Signed)
 [facility] OFFICE PROGRESS NOTE   Diagnosis: Esophagus cancer  INTERVAL HISTORY:   Alison Garner returns as scheduled.  She feels well.  Good appetite.  No dysphagia.  He reports intermittent episodes where she has multiple small-volume formed bowel movements on 1 day.  This last occurred a few weeks ago and has happened intermittently in the past.  No diarrhea.  Objective:  Vital signs in last 24 hours:  Blood pressure 137/71, pulse 87, temperature 98.2 F (36.8 C), temperature source Temporal, resp. rate 18, height 5' 5 (1.651 m), weight 139 lb 3.2 oz (63.1 kg), SpO2 98%.     Lymphatics: No cervical, supraclavicular, or axillary nodes Resp: Lungs clear bilaterally, distant breath sounds Cardio: Distant heart sounds, regular rhythm GI: No hepatosplenomegaly, no apparent ascites, nontender, no mass Vascular: No leg edema   Lab Results:  Lab Results  Component Value Date   WBC 5.1 06/16/2023   HGB 13.7 06/16/2023   HCT 39.5 06/16/2023   MCV 92.1 06/16/2023   PLT 154 06/16/2023   NEUTROABS 3.0 06/16/2023    CMP  Lab Results  Component Value Date   NA 133 (L) 06/16/2023   K 4.3 06/16/2023   CL 98 06/16/2023   CO2 26 06/16/2023   GLUCOSE 66 (L) 06/16/2023   BUN 8 06/16/2023   CREATININE 0.96 06/16/2023   CALCIUM  10.0 06/16/2023   PROT 6.5 06/16/2023   ALBUMIN 4.0 06/16/2023   AST 32 06/16/2023   ALT 21 06/16/2023   ALKPHOS 75 06/16/2023   BILITOT 0.4 06/16/2023   GFRNONAA >60 06/16/2023    Lab Results  Component Value Date   CEA 12.88 (H) 06/16/2023    No results found for: INR, LABPROT  Imaging:  No results found.  Medications: I have reviewed the patient's current medications.   Assessment/Plan: Distal esophagus cancer Esophagram 11/21/2020-moderate to severe esophageal dysmotility, narrowing of the distal esophagus just above the GE junction to 4 mm, 13 mm barium tablet stuck in the distal esophagus Upper endoscopy  12/13/2020-severe esophagitis with nodularity and stricturing at 36 cm-adenocarcinoma, HER2 negative, no loss of mismatch repair protein expression, PD-L1 combined positive score less than 1% CT chest 12/14/2020-accentuated density at the distal esophageal lumen above a small type I hiatal hernia concerning for malignancy, nonpathologically enlarged AP window and right hilar nodes PET 12/21/2020-abnormal FDG uptake at the distal esophagus, no evidence of nodal or distant metastatic disease Radiation 01/08/2021-02/16/2021 Cycle 1 weekly Taxol /carboplatin  01/08/2021 Cycle 2 weekly Taxol /carboplatin  01/15/2021 Treatment held 01/22/2021 due to neutropenia Positive COVID test 02/02/2021 Taxol /carboplatin  02/15/2021 Endoscopy 04/30/2021-shallow ulceration at the distal esophagus, nodularity at the gastric cardia-biopsies negative for malignancy CTs 04/02/2022-small mediastinal and right hilar lymph nodes.  Esophagus is mildly patulous decreased from previous in the mid to upper aspect of the mediastinum.  More distally the wall thickening of the esophagus appears slightly decreased.  Small hiatal hernia.  The area of thickening does correspond to the abnormality on PET-CT.  Emphysema. Endoscopy 04/15/2022-Short segment salmon-colored mucosa 1 cm above the Z-line.  Scarring at the gastroesophageal junction/Z-line with nonobstructing narrowing.  Nodular mucosa in the gastric cardia immediately distal to the Z-line.  Pathology on GE junction nodule-invasive moderately differentiated adenocarcinoma, tumor arises within a background of high-grade dysplasia, mild chronic active gastritis with foveolar hyperplasia, negative for squamous epithelium and intestinal metaplasia.  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  Negative for HER2 by IHC (0), PD-L1 CPS 2%. CLDN 18.2 negative, less than 5% of tumor cells with  2+ and/or 3+ membrane staining. PET scan 04/25/2022-increased radiotracer uptake  associated with the distal esophagus at the level of the GE junction.  No evidence of nodal metastasis or distant metastatic disease. Cycle 1 FOLFOX 06/04/2022 Cycle 2 FOLFOX 06/18/2022, oxaliplatin  dose reduced secondary thrombocytopenia Cycle 3 FOLFOX 06/24/2022, oxaliplatin  dose reduced secondary thrombocytopenia, 5-FU dose reduced secondary to hand/foot syndrome 07/16/2022 chemotherapy held due to thrombocytopenia, dehydration Cycle 4 FOLFOX 07/23/2022, oxaliplatin  dose reduced due to thrombocytopenia Cycle 5 FOLFOX 08/05/2022 CT chest 08/13/2022-mild focal wall thickening at the distal esophagus, 8 mm gastropathic node-not FDG avid on PET, unchanged small mediastinal nodes no evidence of metastatic disease in the chest Upper endoscopy 08/14/2022-benign-appearing mild stenosis in the distal esophagus-microscopic focus of adenocarcinoma Cycle 6 FOLFOX 09/04/2022 Maintenance 5-fluorouracil  via pump every 2 weeks beginning 10/30/2022 5-fluorouracil  infusion 11/14/2022 5-FU  infusion 11/27/2022 5-FU infusion 12/11/2022 5-FU infusion 12/25/2022 5-FU infusion 01/08/2023 CT chest 01/16/2023-stable distal esophageal thickening, no evidence of metastatic disease Upper endoscopy 02/12/2023-localized mucosal changes characterized by nodularity and scarring in the distal esophagus (cardiac mucosa with mild chronic nonspecific carditis, negative for intestinal metaplasia or dysplasia).  Localized mild mucosal changes characterized by nodularity in the cardia (invasive moderately differentiated adenocarcinoma arising in background of high-grade dysplasia); foundation 1-tumor mutation burden 10, HRD signature negative, microsatellite stable, ERBB2 amplification.  CLDN18.2 not evaluable due to insufficient tumor cells.  PD-L1-quantity not sufficient. Cycle 1 Taxol /ramucirumab  03/04/2023 Cycle 2 held 03/18/2023 due to neutropenia Cycle 2 Taxol /ramucirumab  03/25/2023, Taxol  dose reduced CT chest 04/03/2023-esophagus with wall  thickening distally.  Emphysema.  Bilateral pleural effusions.  No PE. Cycle 3 held 04/08/2023 due to edema Cycle 3 Taxol /ramucirumab  04/15/2023 Treatment with Taxol /ramucirumab  discontinued per patient 04/29/2023 08/13/2023: Upper endoscopy: Nodular mucosa with a stricture at 37 cm, nodular mucosa in the gastric cardia: Biopsies-small focus of adenocarcinoma at the esophagus biopsy, small focus of adenocarcinoma of the gastric cardia.  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  Chest CT showed bilateral pleural effusions with associated atelectatic changes.  No PE.  Persistent distal thickening in the esophagus.  Echocardiogram 04/04/2023 with LVEF 55 to 60%, normal LV function, no regional wall motion abnormalities.  Lasix  40 mg daily initiated.  Edema resolved.  Chest x-ray 04/14/2023-mild blunting of the left costophrenic angle without overt pleural effusion.        Disposition: Alison Garner appears unchanged.  There is no clinical evidence for progression of esophagus cancer.  She does not wish to undergo surveillance imaging.  She will return for an office visit and Port-A-Cath flush in 8 weeks.  Arley Hof, MD  01/05/2024  10:45 AM

## 2024-01-21 ENCOUNTER — Other Ambulatory Visit: Payer: Self-pay | Admitting: Family Medicine

## 2024-02-25 ENCOUNTER — Telehealth: Payer: Self-pay | Admitting: Oncology

## 2024-02-25 NOTE — Telephone Encounter (Signed)
 PT called to reschedule appt; day and time confirmed.

## 2024-03-01 ENCOUNTER — Inpatient Hospital Stay: Admitting: Oncology

## 2024-03-01 ENCOUNTER — Inpatient Hospital Stay

## 2024-03-08 ENCOUNTER — Encounter: Payer: Self-pay | Admitting: Oncology

## 2024-03-09 ENCOUNTER — Telehealth: Payer: Self-pay | Admitting: Oncology

## 2024-03-09 ENCOUNTER — Encounter: Payer: Self-pay | Admitting: *Deleted

## 2024-03-09 ENCOUNTER — Inpatient Hospital Stay

## 2024-03-09 ENCOUNTER — Inpatient Hospital Stay: Admitting: Oncology

## 2024-03-09 NOTE — Progress Notes (Signed)
 Alison Garner has requested to cancel flush/OV today via Mychart. Asking for port flush next week in afternoon and see MD when he has an afternoon appointment. Scheduling message sent.

## 2024-03-09 NOTE — Telephone Encounter (Signed)
 PT called to reschedule missed appts.

## 2024-03-19 ENCOUNTER — Inpatient Hospital Stay

## 2024-05-04 ENCOUNTER — Inpatient Hospital Stay: Admitting: Oncology

## 2024-06-23 ENCOUNTER — Ambulatory Visit
